# Patient Record
Sex: Male | Born: 1945 | Race: Black or African American | Hispanic: No | Marital: Married
Health system: Southern US, Community
[De-identification: ages and names within clinical notes are randomized; demographics above are authoritative.]

## PROBLEM LIST (undated history)

## (undated) DIAGNOSIS — I10 Essential (primary) hypertension: Secondary | ICD-10-CM

## (undated) DIAGNOSIS — N189 Chronic kidney disease, unspecified: Secondary | ICD-10-CM

## (undated) DIAGNOSIS — K859 Acute pancreatitis without necrosis or infection, unspecified: Secondary | ICD-10-CM

## (undated) DIAGNOSIS — IMO0002 Reserved for concepts with insufficient information to code with codable children: Secondary | ICD-10-CM

## (undated) DIAGNOSIS — R001 Bradycardia, unspecified: Secondary | ICD-10-CM

## (undated) HISTORY — DX: Chronic kidney disease, unspecified: N18.9

## (undated) HISTORY — DX: Essential (primary) hypertension: I10

## (undated) HISTORY — DX: Acute pancreatitis without necrosis or infection, unspecified: K85.90

## (undated) HISTORY — DX: Reserved for concepts with insufficient information to code with codable children: IMO0002

---

## 1998-09-19 ENCOUNTER — Emergency Department (HOSPITAL_COMMUNITY): Admission: EM | Admit: 1998-09-19 | Discharge: 1998-09-19 | Payer: Self-pay | Admitting: Emergency Medicine

## 1998-09-19 ENCOUNTER — Encounter: Payer: Self-pay | Admitting: Emergency Medicine

## 1998-10-26 ENCOUNTER — Emergency Department (HOSPITAL_COMMUNITY): Admission: EM | Admit: 1998-10-26 | Discharge: 1998-10-26 | Payer: Self-pay | Admitting: Emergency Medicine

## 1999-05-14 ENCOUNTER — Encounter: Payer: Self-pay | Admitting: Emergency Medicine

## 1999-05-14 ENCOUNTER — Emergency Department (HOSPITAL_COMMUNITY): Admission: EM | Admit: 1999-05-14 | Discharge: 1999-05-14 | Payer: Self-pay | Admitting: Emergency Medicine

## 2003-07-05 ENCOUNTER — Emergency Department (HOSPITAL_COMMUNITY): Admission: EM | Admit: 2003-07-05 | Discharge: 2003-07-05 | Payer: Self-pay | Admitting: Emergency Medicine

## 2004-01-18 ENCOUNTER — Emergency Department (HOSPITAL_COMMUNITY): Admission: EM | Admit: 2004-01-18 | Discharge: 2004-01-18 | Payer: Self-pay | Admitting: Emergency Medicine

## 2004-01-20 ENCOUNTER — Emergency Department (HOSPITAL_COMMUNITY): Admission: EM | Admit: 2004-01-20 | Discharge: 2004-01-20 | Payer: Self-pay | Admitting: *Deleted

## 2004-09-19 ENCOUNTER — Emergency Department (HOSPITAL_COMMUNITY): Admission: EM | Admit: 2004-09-19 | Discharge: 2004-09-19 | Payer: Self-pay | Admitting: Emergency Medicine

## 2005-12-25 ENCOUNTER — Emergency Department (HOSPITAL_COMMUNITY): Admission: EM | Admit: 2005-12-25 | Discharge: 2005-12-25 | Payer: Self-pay | Admitting: Emergency Medicine

## 2006-06-18 ENCOUNTER — Emergency Department (HOSPITAL_COMMUNITY): Admission: EM | Admit: 2006-06-18 | Discharge: 2006-06-18 | Payer: Self-pay | Admitting: Emergency Medicine

## 2007-02-12 ENCOUNTER — Emergency Department (HOSPITAL_COMMUNITY): Admission: EM | Admit: 2007-02-12 | Discharge: 2007-02-12 | Payer: Self-pay | Admitting: Emergency Medicine

## 2007-06-04 ENCOUNTER — Emergency Department (HOSPITAL_COMMUNITY): Admission: EM | Admit: 2007-06-04 | Discharge: 2007-06-04 | Payer: Self-pay | Admitting: Emergency Medicine

## 2008-05-21 ENCOUNTER — Emergency Department (HOSPITAL_COMMUNITY): Admission: EM | Admit: 2008-05-21 | Discharge: 2008-05-21 | Payer: Self-pay | Admitting: Emergency Medicine

## 2008-11-18 ENCOUNTER — Emergency Department (HOSPITAL_COMMUNITY): Admission: EM | Admit: 2008-11-18 | Discharge: 2008-11-18 | Payer: Self-pay | Admitting: Family Medicine

## 2008-11-18 ENCOUNTER — Emergency Department (HOSPITAL_COMMUNITY): Admission: EM | Admit: 2008-11-18 | Discharge: 2008-11-18 | Payer: Self-pay | Admitting: Emergency Medicine

## 2010-04-26 ENCOUNTER — Inpatient Hospital Stay (HOSPITAL_COMMUNITY)
Admission: EM | Admit: 2010-04-26 | Discharge: 2010-05-07 | Payer: Self-pay | Source: Home / Self Care | Attending: Internal Medicine | Admitting: Internal Medicine

## 2010-05-06 ENCOUNTER — Encounter: Payer: Self-pay | Admitting: Internal Medicine

## 2010-05-06 DIAGNOSIS — E119 Type 2 diabetes mellitus without complications: Secondary | ICD-10-CM | POA: Insufficient documentation

## 2010-05-06 DIAGNOSIS — I10 Essential (primary) hypertension: Secondary | ICD-10-CM

## 2010-05-06 LAB — CONVERTED CEMR LAB
Albumin: 1.8 g/dL
Alkaline Phosphatase: 215 units/L
BUN: 27 mg/dL
CO2: 21 meq/L
Calcium: 8 mg/dL
Chloride: 110 meq/L
Cholesterol: 164 mg/dL
Creatinine, Ser: 2.33 mg/dL
Platelets: 178 10*3/uL
Potassium: 4 meq/L
TSH: 1.787 microintl units/mL
Total Bilirubin: 2 mg/dL
WBC: 13.8 10*3/uL

## 2010-05-07 ENCOUNTER — Encounter: Payer: Self-pay | Admitting: Internal Medicine

## 2010-05-11 ENCOUNTER — Ambulatory Visit: Payer: Self-pay | Admitting: Internal Medicine

## 2010-05-11 ENCOUNTER — Encounter: Payer: Self-pay | Admitting: Internal Medicine

## 2010-05-11 LAB — CONVERTED CEMR LAB
Alcohol, Ethyl (B): <10 mg/dL (ref 0–10)
Amphetamine Screen, Ur: NEGATIVE
Barbiturate Quant, Ur: NEGATIVE
Benzodiazepines.: NEGATIVE
Bilirubin Urine: NEGATIVE
Blood Glucose, Fingerstick: 326
Creatinine,U: 52.5 mg/dL
Marijuana Metabolite: NEGATIVE
Methadone: NEGATIVE
Nitrite: NEGATIVE
Opiates: NEGATIVE
Phencyclidine (PCP): NEGATIVE
Propoxyphene: NEGATIVE
Specific Gravity, Urine: 1.01
pH: 5

## 2010-05-11 LAB — HM DIABETES FOOT EXAM: HM Diabetic Foot Exam: HIGH

## 2010-05-16 ENCOUNTER — Telehealth: Payer: Self-pay | Admitting: Internal Medicine

## 2010-05-25 ENCOUNTER — Ambulatory Visit: Admission: RE | Admit: 2010-05-25 | Discharge: 2010-05-25 | Payer: Self-pay | Source: Home / Self Care

## 2010-05-25 LAB — CONVERTED CEMR LAB
Blood Glucose, Home Monitor: 2 mg/dL
CO2: 25 meq/L (ref 19–32)
Calcium: 8.5 mg/dL (ref 8.4–10.5)
Sodium: 137 meq/L (ref 135–145)

## 2010-06-01 ENCOUNTER — Telehealth: Payer: Self-pay | Admitting: Internal Medicine

## 2010-06-09 ENCOUNTER — Ambulatory Visit: Admission: RE | Admit: 2010-06-09 | Discharge: 2010-06-09 | Payer: Self-pay | Source: Home / Self Care

## 2010-06-09 LAB — CONVERTED CEMR LAB
CO2: 25 meq/L (ref 19–32)
Creatinine, Ser: 0.92 mg/dL (ref 0.40–1.50)
Glucose, Bld: 63 mg/dL — ABNORMAL LOW (ref 70–99)
Sodium: 139 meq/L (ref 135–145)

## 2010-06-15 NOTE — Letter (Signed)
Summary: LOG BOOK REPORT  LOG BOOK REPORT   Imported By: Margie Billet 05/16/2010 10:15:01  _____________________________________________________________________  External Attachment:    Type:   Image     Comment:   External Document

## 2010-06-15 NOTE — Assessment & Plan Note (Signed)
Summary: DM TEACHING/CH   Vital Signs:  Patient profile:   65 year old male Weight:      192.2 pounds BMI:     26.53  Allergies: No Known Drug Allergies   Complete Medication List: 1)  Ciprofloxacin Hcl 500 Mg Tabs (Ciprofloxacin hcl) .... Take 1 tablet by mouth two times a day for 5 days 2)  Humulin 70/30 70-30 % Susp (Insulin isophane & regular) .... Inject 23 units in the morning and 17 units in the evening 3)  Bd Insulin Syringe 25g X 5/8" 1 Ml Misc (Insulin syringe-needle u-100) .... Use to inject insulin twice a day 4)  Ensure Liqd (Nutritional supplements) .Marland Kitchen.. 1 bottle three times a day 5)  Ranitidine Hcl 300 Mg Caps (Ranitidine hcl) .... Take 1 tablet by mouth once a day 6)  Onetouch Test Strp (Glucose blood) .... Use to check blood sugar three times a day 7)  Blood Glucose Meter Kit (Blood glucose monitoring suppl) .... Use to check blood sugar three times a day 8)  Amlodipine Besylate 10 Mg Tabs (Amlodipine besylate) .... Take 1 tablet by mouth once a day 9)  Aspirin 81 Mg Chew (Aspirin) .... Take 1 tablet by mouth once a day 10)  Fish Oil 500 Mg Caps (Omega-3 fatty acids) .... Take 1 tablet by mouth once a day 11)  Pravastatin Sodium 20 Mg Tabs (Pravastatin sodium) .... Take 1 tablet by mouth once a day 12)  Accu-chek Aviva Kit (Blood glucose monitoring suppl) .... Check blood glucose two times a day before breakfast and before bedtime 13)  Accu-chek Aviva Strp (Glucose blood) .... Check blood glucose two times a day as instructed  Other Orders: DSMT(Medicare) Individual, 30 Minutes (G0108)   Orders Added: 1)  DSMT(Medicare) Individual, 30 Minutes [G0108]    Diabetes Self Management Training  PCP: Bethel Born MD Date diagnosed with diabetes: 04/26/2010 Diabetes Type: Type 2 insulin Other persons present: Spouse Current smoking Status: never  Vital Signs Todays Weight: 192.2lb  in BMI 26.53in-lbs   Diabetes Medications:  Comments: Patient & wife seems  to be handling care of his diabetes well. asking appropriate questions. blood sugars since injecting 23 units before breakfast and 17 units before dinner:  fasting:230,268,179,99, 154,169, 195, 145 this am before lunch:323,489,287,164-had symptoms of hypoglycemia per wife- shaking, cold before dinner:220,109,274,200,241,274,161    Monitoring Self monitoring blood glucose 2 times a day Name of Meter  True Track     Estimated /Usual Carb Intake Breakfast # of Carbs/Grams raisin bran or rice kripsies Lunch # of Carbs/Grams sandwich or hot dog, sugar free drinks Midafternoon # of Carbs/Grams doughnut Dinner # of Carbs/Grams baked meat, starch, bread, vegetable Bedtime # of Carbs/Grams fruit cup  Nutrition assessment Desired Weight:  ~ 190 ETOH : No Biggest challenge to eating healthy: Imbalance CHO Diabetes Disease Process  Discussed today Define diabetes in simple terms: Needs review/assistance   State own type of diabetes: Demonstrates competencyState diabetes is treated by meal plan-exercise-medication-monitoring-education: Needs review/assistance Medications State name-action-dose-duration-side effects-and time to take medication: No knowledge   State appropriate timing of food related to medication: Demonstrates competency   Describe safe needle/lancet disposal: Demonstrates competency Nutritional Management Identify what foods most often affect blood glucose: Needs review/assistance    Verbalize importance of controlling food portions: Needs review/assistance   State changes planned for home meals/snacks: Needs review/assistance    Monitoring State purpose and frequency of monitoring BG-ketones-HgbA1C  : Needs review/assistance   Perform glucose monitoring/ketone testing and record results  correctly: Demonstrates competencyState target blood glucose and HgbA1C goals: Needs review/assistance    Complications State the causes- signs and symptoms and prevention of  hypoglycemia: Needs review/assistance   Explain proper treatment of hypoglycemia: Needs review/assistance Exercise  Lifestyle changes:Goal setting and Problem solving Identify lifestyle behaviors that need to change: Needs review/assistance   Identify risk factors that interfere with health: Needs review/assistance   Develop strategies to reduce risk factors: Needs review/assistance   List at least two appropriate community resources: No knowledgeIdentify Family/SO role in managing diabetes: Demonstrates competencyDiabetes Management Education Done: 05/25/2010    BEHAVIORAL GOALS INITIAL Incorporating appropriate nutritional management: try to eat consistent amount of carbs at meals and snacks Preventing,detecting and treating complications: always carry treatment for low blood sugar with you at all times        Diabetes Self Management Support: wife very supportive, CDE, office staff and visits Follow-up:2 weeks

## 2010-06-15 NOTE — Progress Notes (Signed)
Summary: / increase am insulin dose?/dmr  Phone Note Outgoing Call   Call placed by: Jamison Neighbor RD,CDE,  May 16, 2010 2:37 PM Summary of Call: call and spoke to wife: they have a meter [facility] [employer] brand of meter. advised her to shop for best price on strips until ornage card obtained or health insurance to help with cost. Blood sugars are :saturday, sunday, monday and today low in am: 325, 271, 183,165 high in pm-468 ,455, 308, haven't checked it yet  Using 18 units Humulin 70/30 before breakfast anbd 17 units before dinner- wife trying to get patient to drink sugar free liquids, but reports every now nad then he gets juice and drinks a 12-16 oz bottle. Can am insulin be increased? (calculated Total Daily Dose is  ~44 units a day, 25 units in am and 17 in Pm is  ~ 2/3 TDD in am and 1/3 TDD in Pm)     New/Updated Medications: HUMULIN 70/30 70-30 % SUSP (INSULIN ISOPHANE & REGULAR) inject 23 units in the morning and 17 units in the evening  Appended Document: / increase am insulin dose?/dmr    Phone Note Outgoing Call   Call placed by: Jamison Neighbor RD,CDE,  May 17, 2010 9:14 AM Summary of Call: Georgette Dover- Zeferino's wife with new morning insulin dose of 23 units int he moring before breakfast. Asked her to check his blood sugar 3x a day for 3 days starting tomorrow then go back to checking 2x a day and keep insulin at 23units before breakfast  and 17 units in PM. Appointment 1.12.12. with CDE- patient to =bring meter and call if any blood sugar < 70 mg/dl. Wife repeated instructions and new dose and understanding of when to call office.

## 2010-06-15 NOTE — Assessment & Plan Note (Signed)
Summary: CLASS/SB.   Vital Signs:  Patient profile:   65 year old male Height:      71.5 inches (181.61 cm) Weight:      195.3 pounds (88.77 kg) BMI:     26.96 Temp:     97.4 degrees F (36.33 degrees C) oral Pulse rate:   100 / minute BP sitting:   145 / 82  (right arm) Cuff size:   regular  Vitals Entered By: Chinita Pester RN (May 11, 2010 3:21 PM) Nutritional Status BMI of 25 - 29 = overweight  Does patient need assistance? Functional Status Self care Ambulation Normal    Diabetic Foot Exam  Set Next Diabetic Foot Exam here: 08/10/2010   Complete Medication List: 1)  Ciprofloxacin Hcl 500 Mg Tabs (Ciprofloxacin hcl) .... Take 1 tablet by mouth two times a day for 5 days 2)  Humulin 70/30 70-30 % Susp (Insulin isophane & regular) .... Inject 15 units in the morning and 15 units in the evening 3)  Bd Insulin Syringe 25g X 5/8" 1 Ml Misc (Insulin syringe-needle u-100) .... Use to inject insulin twice a day 4)  Ensure Liqd (Nutritional supplements) .Marland Kitchen.. 1 bottle three times a day 5)  Ranitidine Hcl 300 Mg Caps (Ranitidine hcl) .... Take 1 tablet by mouth once a day 6)  Onetouch Test Strp (Glucose blood) .... Use to check blood sugar three times a day 7)  Blood Glucose Meter Kit (Blood glucose monitoring suppl) .... Use to check blood sugar three times a day 8)  Amlodipine Besylate 10 Mg Tabs (Amlodipine besylate) .... Take 1 tablet by mouth once a day 9)  Aspirin 81 Mg Chew (Aspirin) .... Take 1 tablet by mouth once a day 10)  Fish Oil 500 Mg Caps (Omega-3 fatty acids) .... Take 1 tablet by mouth once a day 11)  Pravastatin Sodium 20 Mg Tabs (Pravastatin sodium) .... Take 1 tablet by mouth once a day 12)  Accu-chek Aviva Kit (Blood glucose monitoring suppl) .... Check blood glucose two times a day before breakfast and before bedtime 13)  Accu-chek Aviva Strp (Glucose blood) .... Check blood glucose two times a day as instructed  Other Orders: T- Capillary Blood Glucose  (16109) T-Urine Microalbumin w/creat. ratio [id number]) T-Basic Metabolic Panel [phone] T-HIV Antibody  (Reflex) [id number]) T- * Misc. Laboratory test [phone]) T-Urinalysis Dipstick only (81003QW) T- * Misc. Laboratory test [id number]) DME Referral (DME) Colonoscopy (Colon) Ophthalmology Referral (Ophthalmology) DSMT(Medicare) Individual, 30 Minutes (N0272)  Patient Instructions: 1)  Please, take Insulin as instructed. 2)  Please, DO NOT drink any alcohol! 3)  please, follow up with Ms. Deb Hill in regards to "an orange card." 4)  Please, call with any concerns; and make a follow up appointment in 1 week. Prescriptions: ACCU-CHEK AVIVA  STRP (GLUCOSE BLOOD) Check blood glucose two times a day as instructed  #100 x 11   Entered by:   Deatra Robinson MD   Authorized by:   Jamison Neighbor RD,CDE   Signed by:   Jamison Neighbor RD,CDE on 05/11/2010   Method used:   Print then Give to Patient   RxID:   [id number] ACCU-CHEK AVIVA  KIT (BLOOD GLUCOSE MONITORING SUPPL) check blood glucose two times a day before breakfast and before bedtime  #1 x 11   Entered by:   Deatra Robinson MD   Authorized by:   Jamison Neighbor RD,CDE   Signed by:   Jamison Neighbor RD,CDE on 05/11/2010   Method used:   Print then  Give to Patient   RxID:   [id number]    Orders Added: 1)  T- Capillary Blood Glucose [82948] 2)  T-Urine Microalbumin w/creat. ratio [[id number]] 3)  Est. Patient Level IV [41324] 4)  T-Basic Metabolic Panel [[id number]] 5)  T-HIV Antibody  (Reflex) [[id number]] 6)  T- * Misc. Laboratory test [99999] 7)  T-Urinalysis Dipstick only [81003QW] 8)  T- * Misc. Laboratory test [99999] 9)  DME Referral [DME] 10)  Colonoscopy [Colon] 11)  Ophthalmology Referral [Ophthalmology] 12)  DSMT(Medicare) Individual, 30 Minutes [G0108]    Prevention & Chronic Care Immunizations   Influenza vaccine: Not documented   Influenza vaccine due: 01/13/2011    Tetanus booster: Not  documented   Tetanus booster due: 05/11/2020    Pneumococcal vaccine: Not documented   Pneumococcal vaccine due: 02/14/2012    H. zoster vaccine: Not documented   H. zoster vaccine deferral: Not available  (05/11/2010)  Colorectal Screening   Hemoccult: Not documented   Hemoccult action/deferral: Refused  (05/11/2010)    Colonoscopy: Not documented   Colonoscopy action/deferral: GI referral  (05/11/2010)  Other Screening   PSA: Not documented   PSA action/deferral: Discussed-decision deferred  (05/11/2010)   PSA due due: 05/12/2011   Smoking status: Not documented  Diabetes Mellitus   HgbA1C: 12.0  (05/06/2010)   Hemoglobin A1C due: 08/10/2010    Eye exam: Not documented   Diabetic eye exam action/deferral: Ophthalmology referral  (05/11/2010)    Foot exam: Not documented   Foot exam action/deferral: Do today   High risk foot: Not documented   Foot care education: Not documented   Foot exam due: 08/10/2010    Urine microalbumin/creatinine ratio: Not documented   Urine microalbumin action/deferral: Ordered   Urine microalbumin/cr due: 05/12/2011    Diabetes flowsheet reviewed?: Yes   Progress toward A1C goal: Unchanged    Stage of readiness to change (diabetes management): Action  Lipids   Total Cholesterol: 164  (05/06/2010)   LDL: 110  (05/06/2010)   LDL Direct: Not documented   HDL: 12  (05/06/2010)   Triglycerides: 210  (05/06/2010)   Lipid panel due: 05/12/2011  Hypertension   Last Blood Pressure: 145 / 82  (05/11/2010)   Serum creatinine: 2.33  (05/06/2010)   Serum potassium 4  (05/06/2010)   Basic metabolic panel due: 06/11/2010    Hypertension flowsheet reviewed?: Yes   Progress toward BP goal: Unchanged    Stage of readiness to change (hypertension management): Action  Self-Management Support :    Diabetes self-management support: Written self-care plan, Referred for medical nutrition therapy  (05/11/2010)   Diabetes care plan printed    Last diabetes self-management training by diabetes educator: 05/11/2010    Hypertension self-management support: Written self-care plan, Referred for medical nutrition therapy  (05/11/2010)   Hypertension self-care plan printed.   Nursing Instructions: Give Flu vaccine today Give tetanus booster today Give Pneumovax today GI referral for screening colonoscopy (see order) CBG today (see order) Refer for screening diabetic eye exam (see order) Diabetic foot exam today Refer for medical nutrition therapy (see order)    Diabetes Self Management Training Referral Patient Name: Dennis [name] Date Of Birth: February 18, 1946 MRN: [id number] Current Diagnosis:  HEALTH SCREENING (ICD-V70.0) HEALTH EXAMINATION IN POPULATION SURVEY (ICD-V70.6) SPECIAL SCREENING FOR MALIGNANT NEOPLASMS COLON (ICD-V76.51) ALCOHOLISM (ICD-303.90) HYPERTENSION (ICD-401.9) LEUKOCYTOSIS (ICD-288.60) ACUTE KIDNEY FAILURE UNSPECIFIED (ICD-584.9) DIABETES MELLITUS, TYPE II (ICD-250.00) PANCREATITIS, HX OF (ICD-V12.70) PROSTATITIS, ACUTE (ICD-601.0)   Process Orders Check Orders Results:     [facility]:  ABN not required for this insurance Tests Sent for requisitioning (May 11, 2010 4:48 PM):     05/11/2010: [facility] -- T-Urine Microalbumin w/creat. ratio [[id number]] (signed)     05/11/2010: [facility] -- T-Basic Metabolic Panel [phone] (signed)     05/11/2010: [facility] -- T-HIV Antibody  (Reflex) [[id number]] (signed)     05/11/2010: [facility] -- T- * Misc. Laboratory test [phone] (signed)     05/11/2010: [facility] -- T- * Misc. Laboratory test [id number] (signed)    Process Orders Check Orders Results:     [facility]: ABN not required for this insurance Tests Sent for requisitioning (May 11, 2010 4:48 PM):     05/11/2010: [facility] -- T-Urine  Microalbumin w/creat. ratio [[id number]] (signed)     05/11/2010: [facility] -- T-Basic Metabolic Panel [phone] (signed)     05/11/2010: [facility] -- T-HIV Antibody  (Reflex) [[id number]] (signed)     05/11/2010: [facility] -- T- * Misc. Laboratory test [id number] (signed)     05/11/2010: [facility] -- T- * Misc. Laboratory test [phone] (signed)   The information above was entered in this note instead of the physician note.    Diabetes Self Management Training  PCP: Bethel Born MD Date diagnosed with diabetes: 04/26/2010 Diabetes Type: Type 2 insulin Other persons present: Spouse  Vital Signs Todays Weight: 195.3lb  Todays Height: 71.5in BMI 26.96in-lbs   Diabetes Medications:  Comments: all blood sugars are high, weight based calcualtion for total daily dose of  insulin is  ~ 44 units a day. patient currently only on 30 units a day and is eatig much better than when he wa sin hospital and still drinking 3 ensure a day that he was told to do when in hospital. I advised to only use ensure when he doesn;t feellike eating much or toreplace a meal or snack. He is eating fairly well now at home.     Next foot exam due: 08/10/2010  Estimated /Usual Carb Intake Breakfast # of Carbs/Grams eggs, bacon, cereal,  ensure, milk and orange juice Lunch # of Carbs/Grams TV dinner & diet soda Midafternoon # of Carbs/Grams canned peaches Dinner # of Carbs/Grams hamburger with cheese, ensure Bedtime # of Carbs/Grams milk or ensure  Nutrition assessment What beverages do you drink?  diet soda and whole milk- suggested he try 2% - he will not drink skim or 1% Diabetes Disease Process  Discussed today State own type of diabetes: No knowledge Medications State name-action-dose-duration-side effects-and time to take medication: No knowledge   State appropriate timing of food related to medication: No knowledge      Nutritional Management Identify what foods most often affect blood glucose: No knowledge    State importance of spacing and not omitting meals and snacks: No knowledge    Monitoring State purpose and frequency of monitoring BG-ketones-HgbA1C  : No knowledge   State target blood glucose and HgbA1C goals: No knowledge    Complications State the causes-signs and symptoms and prevention of Hyperglycemia: No knowledge   Explain proper treatment of hyperglycemia: No knowledge    Exercise  Lifestyle changes:Goal setting and Problem solving Verbalize need for and frequency of health care follow-up: Demonstrates competency   Diabetes Management Education Done: 05/11/2010        Diabetes Self Management Support: wife very supportive, CDE, office staff and visits Follow-up:2 weeks

## 2010-06-15 NOTE — Assessment & Plan Note (Signed)
Summary: RA/NEEDS 1 MONTH F/U VISIT/CH   Vital Signs:  Patient profile:   65 year old male Height:      71.5 inches Weight:      196.9 pounds BMI:     27.18 Temp:     97.9 degrees F oral Pulse rate:   75 / minute BP sitting:   124 / 67  (right arm)  Vitals Entered By: Filomena Jungling NT II (June 09, 2010 9:54 AM) CC: FOLLOWUP VISIT Is Patient Diabetic? Yes Did you bring your meter with you today? Yes Pain Assessment Patient in pain? no       Have you ever been in a relationship where you felt threatened, hurt or afraid?No   Does patient need assistance? Functional Status Self care Ambulation Normal   Primary Care Provider:  Bethel Born MD  CC:  FOLLOWUP VISIT.  History of Present Illness: 65 y/o m with DM, HTN,alcaholism comes for follow up  No new compaints today DM- his last HbA1 c was 12 on 12/11. he has been complaint with his medications and insulin. He checks his CBG at home and says that the sugars run in 100-150s. his does not have any vision changes or new ulcers in the foot/upper extremity. he has been followed by Jamison Neighbor regulalrly. He has been educated about carb counting, Insulin injections and other aspects of DM. No s/s of hypoglycemia   HTN- well controlled on current regimen. No episdes of dizziness, light headed ness or hypotension. complaint with current medication. no perceived side effects at this time.        Preventive Screening-Counseling & Management  Alcohol-Tobacco     Alcohol drinks/day: 0     Smoking Status: never  Caffeine-Diet-Exercise     Does Patient Exercise: no  Current Medications (verified): 1)  Humulin 70/30 70-30 % Susp (Insulin Isophane & Regular) .... Inject 23 Units in The Morning and 17 Units in The Evening 2)  Bd Insulin Syringe 25g X 5/8" 1 Ml Misc (Insulin Syringe-Needle U-100) .... Use To Inject Insulin Twice A Day 3)  Ranitidine Hcl 300 Mg Caps (Ranitidine Hcl) .... Take 1 Tablet By Mouth Once A Day For  Heartburn 4)  Onetouch Test  Strp (Glucose Blood) .... Use To Check Blood Sugar Three Times A Day 5)  Blood Glucose Meter  Kit (Blood Glucose Monitoring Suppl) .... Use To Check Blood Sugar Three Times A Day 6)  Amlodipine Besylate 10 Mg Tabs (Amlodipine Besylate) .... Take 1 Tablet By Mouth Once A Day For Blood Preassure 7)  Aspirin 81 Mg Chew (Aspirin) .... Take 1 Tablet By Mouth Once A Day For Heart 8)  Pravastatin Sodium 20 Mg Tabs (Pravastatin Sodium) .... Take 1 Tablet By Mouth Once A Day 9)  Accu-Chek Aviva  Kit (Blood Glucose Monitoring Suppl) .... Check Blood Glucose Two Times A Day Before Breakfast and Before Bedtime 10)  Accu-Chek Aviva  Strp (Glucose Blood) .... Check Blood Glucose Two Times A Day As Instructed 11)  Ibuprofen 200 Mg Tabs (Ibuprofen) .Marland Kitchen.. 1-2 Tablets As Needed For Pain  Allergies (verified): No Known Drug Allergies  Review of Systems  The patient denies anorexia, fever, weight loss, weight gain, vision loss, decreased hearing, hoarseness, chest pain, syncope, dyspnea on exertion, peripheral edema, prolonged cough, headaches, hemoptysis, abdominal pain, melena, hematochezia, severe indigestion/heartburn, hematuria, incontinence, genital sores, muscle weakness, suspicious skin lesions, transient blindness, difficulty walking, depression, unusual weight change, abnormal bleeding, enlarged lymph nodes, angioedema, breast masses, and testicular masses.  Physical Exam  General:  Gen: VS reveiwed, Alert, well developed, nodistress ENT: mucous membranes pink & moist. No abnormal finds in ear and nose. CVC:S1 S2 , no murmurs, no abnormal heart sounds. Lungs: Clear to auscultation B/L. No wheezes, crackles or other abnormal sounds Abdomen: soft, non distended, no tender. Normal Bowel sounds EXT: no pitting edema, no engorged veins, Pulsations normal  Neuro:alert, oriented *3, cranial nerved 2-12 intact, strenght normal in all  extremities, senstations normal to light  touch.      Impression & Recommendations:  Problem # 1:  HYPERTENSION (ICD-401.9)  well conotrlled with just norvasc will add lisinopril low dose for renal protection will check BMET today May change regimen to hctz-lisinopril and d/c norvasc to just keep one medicine renal function stabilizes. REcheck BMET at next visit   His updated medication list for this problem includes:    Amlodipine Besylate 10 Mg Tabs (Amlodipine besylate) .Marland Kitchen... Take 1 tablet by mouth once a day for blood preassure    Lisinopril 5 Mg Tabs (Lisinopril) .Marland Kitchen... Take 1 tablet by mouth once a day for kidneys  BP today: 124/67 Prior BP: 145/82 (05/11/2010)  Labs Reviewed: K+: 4.0 (05/11/2010) Creat: : 1.96 (05/11/2010)   Chol: 164 (05/06/2010)   HDL: 12 (05/06/2010)   LDL: 110 (05/06/2010)   TG: 210 (05/06/2010)  Orders: T-Basic Metabolic Panel [phone])  Problem # 2:  DIABETES MELLITUS, TYPE II (ICD-250.00) doing well based on meter readings on current dose continue for now  His updated medication list for this problem includes:    Humulin 70/30 70-30 % Susp (Insulin isophane & regular) ..... Inject 23 units in the morning and 17 units in the evening    Aspirin 81 Mg Chew (Aspirin) .Marland Kitchen... Take 1 tablet by mouth once a day for heart    Lisinopril 5 Mg Tabs (Lisinopril) .Marland Kitchen... Take 1 tablet by mouth once a day for kidneys  Complete Medication List: 1)  Humulin 70/30 70-30 % Susp (Insulin isophane & regular) .... Inject 23 units in the morning and 17 units in the evening 2)  Bd Insulin Syringe 25g X 5/8" 1 Ml Misc (Insulin syringe-needle u-100) .... Use to inject insulin twice a day 3)  Ranitidine Hcl 300 Mg Caps (Ranitidine hcl) .... Take 1 tablet by mouth once a day for heartburn 4)  Onetouch Test Strp (Glucose blood) .... Use to check blood sugar three times a day 5)  Blood Glucose Meter Kit (Blood glucose monitoring suppl) .... Use to check blood sugar three times a day 6)  Amlodipine Besylate 10 Mg  Tabs (Amlodipine besylate) .... Take 1 tablet by mouth once a day for blood preassure 7)  Aspirin 81 Mg Chew (Aspirin) .... Take 1 tablet by mouth once a day for heart 8)  Pravastatin Sodium 20 Mg Tabs (Pravastatin sodium) .... Take 1 tablet by mouth once a day 9)  Accu-chek Aviva Kit (Blood glucose monitoring suppl) .... Check blood glucose two times a day before breakfast and before bedtime 10)  Accu-chek Aviva Strp (Glucose blood) .... Check blood glucose two times a day as instructed 11)  Ibuprofen 200 Mg Tabs (Ibuprofen) .Marland Kitchen.. 1-2 tablets as needed for pain 12)  Lisinopril 5 Mg Tabs (Lisinopril) .... Take 1 tablet by mouth once a day for kidneys  Other Orders: T-Hemoccult Card-Multiple (take home) ([id number])  Patient Instructions: 1)  Please schedule a follow-up appointment in 2-3 months with me. Prescriptions: LISINOPRIL 5 MG TABS (LISINOPRIL) Take 1 tablet by mouth once a day  for kidneys  #31 x 0   Entered and Authorized by:   Bethel Born MD   Signed by:   Bethel Born MD on 06/09/2010   Method used:   Electronically to        [facility] [phone]* (retail)       [address]       [place], [place]  [address]       Ph: [phone]       Fax: [phone]   RxID:   [id number]    Orders Added: 1)  T-Basic Metabolic Panel [id number] 2)  Est. Patient Level IV [30160] 3)  T-Hemoccult Card-Multiple (take home) [82270]   Process Orders Check Orders Results:     [facility]: ABN not required for this insurance Tests Sent for requisitioning (June 09, 2010 1:21 PM):     06/09/2010: [facility] -- T-Basic Metabolic Panel [phone] (signed)     Prevention & Chronic Care Immunizations   Influenza vaccine: Not documented   Influenza vaccine due: 01/13/2012    Tetanus booster: Not documented   Tetanus booster due: 05/11/2020    Pneumococcal vaccine: Not documented   Pneumococcal vaccine due: 02/14/2012    H. zoster  vaccine: Not documented   H. zoster vaccine deferral: Not available  (05/11/2010)  Colorectal Screening   Hemoccult: Not documented   Hemoccult action/deferral: Refused  (05/11/2010)    Colonoscopy: Not documented   Colonoscopy action/deferral: GI referral  (05/11/2010)  Other Screening   PSA: Not documented   PSA action/deferral: Discussed-decision deferred  (05/11/2010)   PSA due due: 05/12/2011   Smoking status: never  (06/09/2010)  Diabetes Mellitus   HgbA1C: 12.0  (05/06/2010)   Hemoglobin A1C due: 08/10/2010    Eye exam: Not documented   Diabetic eye exam action/deferral: Ophthalmology referral  (05/11/2010)    Foot exam: yes  (05/11/2010)   Foot exam action/deferral: Do today   High risk foot: Yes  (05/11/2010)   Foot care education: Done  (05/11/2010)   Foot exam due: 08/10/2010    Urine microalbumin/creatinine ratio: 130.2  (05/11/2010)   Urine microalbumin action/deferral: Ordered   Urine microalbumin/cr due: 05/12/2011    Diabetes flowsheet reviewed?: Yes   Progress toward A1C goal: Unchanged  Lipids   Total Cholesterol: 164  (05/06/2010)   LDL: 110  (05/06/2010)   LDL Direct: Not documented   HDL: 12  (05/06/2010)   Triglycerides: 210  (05/06/2010)   Lipid panel due: 05/12/2011  Hypertension   Last Blood Pressure: 124 / 67  (06/09/2010)   Serum creatinine: 1.96  (05/11/2010)   Serum potassium 4.0  (05/11/2010)   Basic metabolic panel due: 06/11/2010    Hypertension flowsheet reviewed?: Yes   Progress toward BP goal: At goal  Self-Management Support :   Personal Goals (by the next clinic visit) :     Personal A1C goal: 7  (05/11/2010)     Personal blood pressure goal: 130/80  (05/11/2010)     Personal LDL goal: 100  (05/11/2010)    Patient will work on the following items until the next clinic visit to reach self-care goals:     Medications and monitoring: take my medicines every day, check my blood sugar, examine my feet every day   (06/09/2010)     Eating: eat more vegetables, use fresh or frozen vegetables, eat foods that are low in salt, eat baked foods instead of fried foods, eat fruit for snacks and desserts  (06/09/2010)  Activity: take a 30 minute walk every day  (06/09/2010)    Diabetes self-management support: Written self-care plan, Referred for medical nutrition therapy  (05/11/2010)   Last diabetes self-management training by diabetes educator: 05/25/2010    Hypertension self-management support: Written self-care plan, Referred for medical nutrition therapy  (05/11/2010)

## 2010-06-15 NOTE — Discharge Summary (Signed)
Summary: Hospital Discharge Update    Hospital Discharge Update:  Date of Admission: 04/26/2010 Date of Discharge: 05/07/2010  Brief Summary:  prostatis new diagnoses- DM, HTN, HLD   Lab or other results pending at discharge:  none  Labs needed at follow-up: CBC with differential, Comprehensive metabolic panel  Other follow-up issues:  - resolution of urinanry complaints and abdominal pain from prostatis - CMET- to llok for LFTs as he had a temporary rise in LFTs with obstructive jaundice picture - Also note creatitnine as he had an Acute renal failure from gentamicin toxicity in the hospital that was resoving at discharge - Diabetes- inquire if he is able to get insulin 70/30. Restart metformin if creatinine normalized. Schedule an appointment with Jamison Neighbor. See if he is checking blood sugars at home. Give prescription for glucose meter and test strips after he sees Jaynee Eagles for orange card - HTN- start ACei/HCTZ if  creatinine better - Jaynee Eagles consultation for orange card and financial aid  The medication, problem, and allergy lists have been updated.  Please see the dictated discharge summary for details.  Discharge medications:  CIPROFLOXACIN HCL 500 MG TABS (CIPROFLOXACIN HCL) Take 1 tablet by mouth two times a day for 5 days HUMULIN 70/30 70-30 % SUSP (INSULIN ISOPHANE & REGULAR) inject 15 units in the morning and 15 units in the evening BD INSULIN SYRINGE 25G X 5/8" 1 ML MISC (INSULIN SYRINGE-NEEDLE U-100) use to inject insulin twice a day ENSURE  LIQD (NUTRITIONAL SUPPLEMENTS) 1 bottle three times a day RANITIDINE HCL 300 MG CAPS (RANITIDINE HCL) Take 1 tablet by mouth once a day ONETOUCH TEST  STRP (GLUCOSE BLOOD) use to check blood sugar three times a day BLOOD GLUCOSE METER  KIT (BLOOD GLUCOSE MONITORING SUPPL) use to check blood sugar three times a day AMLODIPINE BESYLATE 10 MG TABS (AMLODIPINE BESYLATE) Take 1 tablet by mouth once a day ASPIRIN 81 MG CHEW  (ASPIRIN) Take 1 tablet by mouth once a day FISH OIL 500 MG CAPS (OMEGA-3 FATTY ACIDS) Take 1 tablet by mouth once a day PRAVASTATIN SODIUM 20 MG TABS (PRAVASTATIN SODIUM) Take 1 tablet by mouth once a day  Other patient instructions:  your next appointment [facility] is on 05/11/10 at 3 pm  Note: Hospital Discharge Medications & Other Instructions handout was printed, one copy for patient and a second copy to be placed in hospital chart.

## 2010-06-15 NOTE — Miscellaneous (Signed)
Summary: hospital discharge (prostatitis)  Hospital Discharge  Date of admission:04/26/10  Date of discharge:05/07/10  Brief reason for admission/active problems:abdominal pain- found to have severe prostatitis/?abcess, urologist recommended medical management, also found to have DM and HTN (new diagnosis)  Followup needed: - resolution of urinanry complaints and abdominal pain from prostatis - CMET- to llok for LFTs as he had a temporary rise in LFTs with obstructive jaundice picture - Also note creatitnine as he had an Acute renal failure from gentamicin toxicity in the hospital that was resoving at discharge - Diabetes- inquire if he is able to get insulin 70/30. Restart metformin if creatinine normalized. Schedule an appointment with Jamison Neighbor. See if he is checking blood sugars at home. Give prescription for glucose meter and test strips after he sees Jaynee Eagles for orange card - HTN- start ACei/HCTZ if  creatinine better - Jaynee Eagles consultation for orange card and financial aid  The medication and problem lists have been updated.  Please see the dictated discharge summary for details.    Prescriptions: PRAVASTATIN SODIUM 20 MG TABS (PRAVASTATIN SODIUM) Take 1 tablet by mouth once a day  #30 x 0   Entered and Authorized by:   Bethel Born MD   Signed by:   Bethel Born MD on 05/07/2010   Method used:   Print then Give to Patient   RxID:   [id number] ASPIRIN 81 MG CHEW (ASPIRIN) Take 1 tablet by mouth once a day  #31 x 0   Entered and Authorized by:   Bethel Born MD   Signed by:   Bethel Born MD on 05/07/2010   Method used:   Print then Give to Patient   RxID:   [id number] AMLODIPINE BESYLATE 10 MG TABS (AMLODIPINE BESYLATE) Take 1 tablet by mouth once a day  #31 x 0   Entered and Authorized by:   Bethel Born MD   Signed by:   Bethel Born MD on 05/07/2010   Method used:   Print then Give to Patient   RxID:   [id number] RANITIDINE HCL 300 MG  CAPS (RANITIDINE HCL) Take 1 tablet by mouth once a day  #30 x 0   Entered and Authorized by:   Bethel Born MD   Signed by:   Bethel Born MD on 05/07/2010   Method used:   Print then Give to Patient   RxID:   [id number] ENSURE  LIQD (NUTRITIONAL SUPPLEMENTS) 1 bottle three times a day  #100 x prn   Entered and Authorized by:   Bethel Born MD   Signed by:   Bethel Born MD on 05/07/2010   Method used:   Print then Give to Patient   RxID:   [id number] BD INSULIN SYRINGE 25G X 5/8" 1 ML MISC (INSULIN SYRINGE-NEEDLE U-100) use to inject insulin twice a day  #1 month supp x 0   Entered and Authorized by:   Bethel Born MD   Signed by:   Bethel Born MD on 05/07/2010   Method used:   Print then Give to Patient   RxID:   [id number] HUMULIN 70/30 70-30 % SUSP (INSULIN ISOPHANE & REGULAR) inject 15 units in the morning and 15 units in the evening  #1 month supp x 0   Entered and Authorized by:   Bethel Born MD   Signed by:   Bethel Born MD on 05/07/2010   Method used:   Print then Give to Patient   RxID:   [id number] CIPROFLOXACIN HCL 500 MG TABS (  CIPROFLOXACIN HCL) Take 1 tablet by mouth two times a day for 5 days  #10 x 0   Entered and Authorized by:   Bethel Born MD   Signed by:   Bethel Born MD on 05/07/2010   Method used:   Print then Give to Patient   RxID:   [id number] FISH OIL 500 MG CAPS (OMEGA-3 FATTY ACIDS) Take 1 tablet by mouth once a day  #OTC x 0   Entered and Authorized by:   Bethel Born MD   Signed by:   Bethel Born MD on 05/07/2010   Method used:   Print then Give to Patient   RxID:   [id number] ASPIRIN 81 MG CHEW (ASPIRIN) Take 1 tablet by mouth once a day  #31 x 0   Entered and Authorized by:   Bethel Born MD   Signed by:   Bethel Born MD on 05/07/2010   Method used:   Print then Give to Patient   RxID:   [id number] AMLODIPINE BESYLATE 10 MG TABS (AMLODIPINE BESYLATE) Take 1 tablet by mouth once a day   #31 x 0   Entered and Authorized by:   Bethel Born MD   Signed by:   Bethel Born MD on 05/07/2010   Method used:   Print then Give to Patient   RxID:   [id number] RANITIDINE HCL 300 MG CAPS (RANITIDINE HCL) Take 1 tablet by mouth once a day  #30 x 0   Entered and Authorized by:   Bethel Born MD   Signed by:   Bethel Born MD on 05/07/2010   Method used:   Print then Give to Patient   RxID:   [id number] ENSURE  LIQD (NUTRITIONAL SUPPLEMENTS) 1 bottle three times a day  #100 x prn   Entered and Authorized by:   Bethel Born MD   Signed by:   Bethel Born MD on 05/07/2010   Method used:   Print then Give to Patient   RxID:   [id number] BD INSULIN SYRINGE 25G X 5/8" 1 ML MISC (INSULIN SYRINGE-NEEDLE U-100) use to inject insulin twice a day  #1 month supp x 0   Entered and Authorized by:   Bethel Born MD   Signed by:   Bethel Born MD on 05/07/2010   Method used:   Print then Give to Patient   RxID:   [id number] HUMULIN 70/30 70-30 % SUSP (INSULIN ISOPHANE & REGULAR) inject 15 units in the morning and 15 units in the evening  #1 month supp x 0   Entered and Authorized by:   Bethel Born MD   Signed by:   Bethel Born MD on 05/07/2010   Method used:   Print then Give to Patient   RxID:   [id number] CIPROFLOXACIN HCL 500 MG TABS (CIPROFLOXACIN HCL) Take 1 tablet by mouth two times a day for 5 days  #10 x 0   Entered and Authorized by:   Bethel Born MD   Signed by:   Bethel Born MD on 05/07/2010   Method used:   Print then Give to Patient   RxID:   [id number] ASPIRIN 81 MG CHEW (ASPIRIN) Take 1 tablet by mouth once a day  #31 x 0   Entered and Authorized by:   Bethel Born MD   Signed by:   Bethel Born MD on 05/06/2010   Method used:   Print then Give to Patient   RxID:   [id number] AMLODIPINE BESYLATE 10 MG TABS (AMLODIPINE BESYLATE) Take 1  tablet by mouth once a day  #31 x 0   Entered and Authorized by:   Bethel Born MD    Signed by:   Bethel Born MD on 05/06/2010   Method used:   Print then Give to Patient   RxID:   [id number] HUMULIN 70/30 70-30 % SUSP (INSULIN ISOPHANE & REGULAR) inject 15 units in the morning and 15 units in the evening  #1 month supp x 0   Entered and Authorized by:   Bethel Born MD   Signed by:   Bethel Born MD on 05/06/2010   Method used:   Print then Give to Patient   RxID:   [id number] BLOOD GLUCOSE METER  KIT (BLOOD GLUCOSE MONITORING SUPPL) use to check blood sugar three times a day  #1 x 0   Entered and Authorized by:   Bethel Born MD   Signed by:   Bethel Born MD on 05/06/2010   Method used:   Print then Give to Patient   RxID:   [id number] ONETOUCH TEST  STRP (GLUCOSE BLOOD) use to check blood sugar three times a day  #1 month supp x 0   Entered and Authorized by:   Bethel Born MD   Signed by:   Bethel Born MD on 05/06/2010   Method used:   Print then Give to Patient   RxID:   [id number] RANITIDINE HCL 300 MG CAPS (RANITIDINE HCL) Take 1 tablet by mouth once a day  #30 x 0   Entered and Authorized by:   Bethel Born MD   Signed by:   Bethel Born MD on 05/06/2010   Method used:   Print then Give to Patient   RxID:   [id number] ENSURE  LIQD (NUTRITIONAL SUPPLEMENTS) 1 bottle three times a day  #100 x prn   Entered and Authorized by:   Bethel Born MD   Signed by:   Bethel Born MD on 05/06/2010   Method used:   Print then Give to Patient   RxID:   [id number] BD INSULIN SYRINGE 25G X 5/8" 1 ML MISC (INSULIN SYRINGE-NEEDLE U-100) use to inject insulin twice a day  #1 month supp x 0   Entered and Authorized by:   Bethel Born MD   Signed by:   Bethel Born MD on 05/06/2010   Method used:   Print then Give to Patient   RxID:   [id number] CIPROFLOXACIN HCL 500 MG TABS (CIPROFLOXACIN HCL) Take 1 tablet by mouth two times a day for 5 days  #10 x 0   Entered and Authorized by:   Bethel Born MD   Signed by:   Bethel Born MD on 05/06/2010   Method used:   Print then Give to Patient   RxID:   [id number]      Family History: Diabetes in sister  Social History: USed to be a heavy drinker in past, drinks occasional beer now Non smoker h/o marijuana use Worked at [employer], retired in [employer] school graduate No primary insurance    Patient Instructions: 1)  Your appointment in the [facility] is on 05/11/10 at 3 pm. Bring all your medicines at that time. Our number is [phone] if you have any questions.

## 2010-06-15 NOTE — Assessment & Plan Note (Signed)
Summary: HFU/ NEW TO CLINIC/SB   Vital Signs:  Patient profile:   65 year old male Height:      71.5 inches (181.61 cm) Weight:      195.3 pounds (88.77 kg) BMI:     26.96 Temp:     97.4 degrees F oral Pulse rate:   100 / minute BP sitting:   145 / 82  (right arm) Cuff size:   regular  Vitals Entered By: Chinita Pester RN (May 11, 2010 3:24 PM) CC: Hospital f/u. New diabetic. Is Patient Diabetic? Yes Did you bring your meter with you today? Yes Pain Assessment Patient in pain? no      Nutritional Status BMI of 25 - 29 = overweight CBG Result 326  Have you ever been in a relationship where you felt threatened, hurt or afraid?Unable to ask; wife w/pt.   Does patient need assistance? Functional Status Self care Ambulation Normal   Diabetic Foot Exam Foot Inspection Is there a history of a foot ulcer?              No Is there a foot ulcer now?              No Can the patient see the bottom of their feet?          Yes Are the shoes appropriate in style and fit?          Yes Is there swelling or an abnormal foot shape?          No Are the toenails long?                No Are the toenails thick?                Yes Are the toenails ingrown?              No Is there heavy callous build-up?              Yes Is there pain in the calf muscle (Intermittent claudication) when walking?    NoIs there a claw toe deformity?              No Is there elevated skin temperature?            No Is there limited ankle dorsiflexion?            No Is there foot or ankle muscle weakness?            No  Diabetic Foot Care Education Patient educated on appropriate care of diabetic feet.  Pulse Check          Right Foot          Left Foot Posterior Tibial:        normal            normal Dorsalis Pedis:        normal            normal  High Risk Feet? Yes   10-g (5.07) Semmes-Weinstein Monofilament Test           Right Foot          Left Foot Visual Inspection               Test  Control      normal         normal Site 1         normal         normal Site 2  normal         normal Site 3         normal         normal Site 4         normal         normal Site 5         normal         normal Site 6         normal         normal Site 7         normal         normal Site 8         normal         normal Site 9         normal         normal Site 10         normal         normal  Impression      normal         normal  Legend:  Site 1 = Plantar aspect of first toe (center of pad) Site 2 = Plantar aspect of third toe (center of pad) Site 3 = Plantar aspect of fifth toe (center of pad) Site 4 = Plantar aspect of first metatarsal head Site 5 = Plantar aspect of third metatarsal head Site 6 = Plantar aspect of fifth metatarsal head Site 7 = Plantar aspect of medial midfoot Site 8 = Plantar aspect of lateral midfoot Site 9 = Plantar aspect of heel Site 10 = dorsal aspect of foot between the base of the first and second toes   Result is Abnormal if patient was unable to perceive the monofilament at site indicated.    CC:  Hospital f/u. New diabetic.Dennis [name]  History of Present Illness: Follow up from hospitalization ->please, refer to Dr. Macario Carls d/C summary note. patient reports feeling better. Denies any Sx besides poor appetite and feeling weak.  Depression History:      The patient denies a depressed mood most of the day and a diminished interest in his usual daily activities.         Preventive Screening-Counseling & Management  Alcohol-Tobacco     Alcohol drinks/day: 0     Smoking Status: never  Caffeine-Diet-Exercise     Does Patient Exercise: no  Problems Prior to Update: 1)  Alcoholism  (ICD-303.90) 2)  Hypertension  (ICD-401.9) 3)  Leukocytosis  (ICD-288.60) 4)  Acute Kidney Failure Unspecified  (ICD-584.9) 5)  Diabetes Mellitus, Type II  (ICD-250.00) 6)  Pancreatitis, Hx of  (ICD-V12.70) 7)  Prostatitis, Acute  (ICD-601.0)  Current  Problems (verified): 1)  Health Screening  (ICD-V70.0) 2)  Alcoholism  (ICD-303.90) 3)  Hypertension  (ICD-401.9) 4)  Leukocytosis  (ICD-288.60) 5)  Acute Kidney Failure Unspecified  (ICD-584.9) 6)  Diabetes Mellitus, Type II  (ICD-250.00) 7)  Pancreatitis, Hx of  (ICD-V12.70) 8)  Prostatitis, Acute  (ICD-601.0)  Medications Prior to Update: 1)  Ciprofloxacin Hcl 500 Mg Tabs (Ciprofloxacin Hcl) .... Take 1 Tablet By Mouth Two Times A Day For 5 Days 2)  Humulin 70/30 70-30 % Susp (Insulin Isophane & Regular) .... Inject 15 Units in The Morning and 15 Units in The Evening 3)  Bd Insulin Syringe 25g X 5/8" 1 Ml Misc (Insulin Syringe-Needle U-100) .... Use To Inject Insulin Twice A Day 4)  Ensure  Liqd (Nutritional Supplements) .Dennis [name].. 1 Bottle Three Times A Day  5)  Ranitidine Hcl 300 Mg Caps (Ranitidine Hcl) .... Take 1 Tablet By Mouth Once A Day 6)  Onetouch Test  Strp (Glucose Blood) .... Use To Check Blood Sugar Three Times A Day 7)  Blood Glucose Meter  Kit (Blood Glucose Monitoring Suppl) .... Use To Check Blood Sugar Three Times A Day 8)  Amlodipine Besylate 10 Mg Tabs (Amlodipine Besylate) .... Take 1 Tablet By Mouth Once A Day 9)  Aspirin 81 Mg Chew (Aspirin) .... Take 1 Tablet By Mouth Once A Day 10)  Fish Oil 500 Mg Caps (Omega-3 Fatty Acids) .... Take 1 Tablet By Mouth Once A Day 11)  Pravastatin Sodium 20 Mg Tabs (Pravastatin Sodium) .... Take 1 Tablet By Mouth Once A Day  Current Medications (verified): 1)  Ciprofloxacin Hcl 500 Mg Tabs (Ciprofloxacin Hcl) .... Take 1 Tablet By Mouth Two Times A Day For 5 Days 2)  Humulin 70/30 70-30 % Susp (Insulin Isophane & Regular) .... Inject 15 Units in The Morning and 15 Units in The Evening 3)  Bd Insulin Syringe 25g X 5/8" 1 Ml Misc (Insulin Syringe-Needle U-100) .... Use To Inject Insulin Twice A Day 4)  Ensure  Liqd (Nutritional Supplements) .Dennis [name].. 1 Bottle Three Times A Day 5)  Ranitidine Hcl 300 Mg Caps (Ranitidine Hcl) .... Take 1  Tablet By Mouth Once A Day 6)  Onetouch Test  Strp (Glucose Blood) .... Use To Check Blood Sugar Three Times A Day 7)  Blood Glucose Meter  Kit (Blood Glucose Monitoring Suppl) .... Use To Check Blood Sugar Three Times A Day 8)  Amlodipine Besylate 10 Mg Tabs (Amlodipine Besylate) .... Take 1 Tablet By Mouth Once A Day 9)  Aspirin 81 Mg Chew (Aspirin) .... Take 1 Tablet By Mouth Once A Day 10)  Fish Oil 500 Mg Caps (Omega-3 Fatty Acids) .... Take 1 Tablet By Mouth Once A Day 11)  Pravastatin Sodium 20 Mg Tabs (Pravastatin Sodium) .... Take 1 Tablet By Mouth Once A Day  Allergies (verified): No Known Drug Allergies  Past History:  Past medical, surgical, family and social histories (including risk factors) reviewed for relevance to current acute and chronic problems.  Family History: Reviewed history from 05/06/2010 and no changes required. Diabetes in sister  Social History: Reviewed history from 05/06/2010 and no changes required. USed to be a heavy drinker in past, drinks occasional beer now Non smoker h/o marijuana use Worked at [employer], retired in [employer] school graduate No primary insurance Smoking Status:  never Does Patient Exercise:  no  Physical Exam  General:  alert, well-developed, well-hydrated, and underweight appearing.  Strong alcohol malodor present Head:  normocephalic, atraumatic, and no abnormalities observed.   Eyes:  No corneal or conjunctival inflammation noted. EOMI. Perrla. Funduscopic exam benign, without hemorrhages, exudates or papilledema. Vision grossly normal. Ears:  External ear exam shows no significant lesions or deformities.  Otoscopic examination reveals clear canals, tympanic membranes are intact bilaterally without bulging, retraction, inflammation or discharge. Hearing is grossly normal bilaterally. Nose:  External nasal examination shows no deformity or inflammation. Nasal mucosa are pink and moist without lesions or  exudates. Mouth:  pharynx pink and moist, poor dentition, and teeth missing.   Neck:  No deformities, masses, or tenderness noted. Lungs:  Normal respiratory effort, chest expands symmetrically. Lungs are clear to auscultation, no crackles or wheezes. Heart:  Normal rate and regular rhythm. S1 and S2 normal without gallop, murmur, click, rub or other extra sounds. Abdomen:  Bowel  sounds positive,abdomen soft and non-tender without masses, organomegaly or hernias noted. Msk:  No deformity or scoliosis noted of thoracic or lumbar spine.   Pulses:  R and L carotid,radial,femoral,dorsalis pedis and posterior tibial pulses are full and equal bilaterally Extremities:  No clubbing, cyanosis, edema, or deformity noted with normal full range of motion of all joints.   Neurologic:  No cranial nerve deficits noted. Station and gait are normal. Plantar reflexes are down-going bilaterally. DTRs are symmetrical throughout. Sensory, motor and coordinative functions appear intact. Skin:  Intact without suspicious lesions or rashes Cervical Nodes:  No lymphadenopathy noted Inguinal Nodes:  No significant adenopathy Psych:  Cognition and judgment appear intact. Alert and cooperative with normal attention span and concentration. No apparent delusions, illusions, hallucinations  Diabetes Management Exam:    Foot Exam (with socks and/or shoes not present):       Sensory-Monofilament:          Left foot: normal          Right foot: normal   Impression & Recommendations:  Problem # 1:  ALCOHOLISM (ICD-303.90) Assessment Unchanged Strongly advised to abstain from ETOH use --correlation with DM explained. Orders: T-Drug Screen-Urine, (single) [id number])  Problem # 2:  HYPERTENSION (ICD-401.9) Will continue with amlodipine for now.  His updated medication list for this problem includes:    Amlodipine Besylate 10 Mg Tabs (Amlodipine besylate) .Dennis [name]... Take 1 tablet by mouth once a day  BP today:  145/82  Labs Reviewed: K+: 4 (05/06/2010) Creat: : 2.33 (05/06/2010)   Chol: 164 (05/06/2010)   HDL: 12 (05/06/2010)   LDL: 110 (05/06/2010)   TG: 210 (05/06/2010)  Problem # 3:  ACUTE KIDNEY FAILURE UNSPECIFIED (ICD-584.9) Assessment: Comment Only Gentamycin-induced while in the hospital. It is unclear whether the patient still take Metformin since he did not bring his medications with him. Instructed NOT to take metformin until advised otherwise.will check B-met and follow up accordingly. Orders: T-Drug Screen-Urine, (single) [phone])  Labs Reviewed: BUN: 27 (05/06/2010)   Cr: 2.33 (05/06/2010)    Hgb: 11.3 (05/06/2010)   Ca++: 8.0 (05/06/2010)    Alb: 1.8 (05/06/2010)  Problem # 4:  DIABETES MELLITUS, TYPE II (ICD-250.00) Assessment: Deteriorated  CBG's still in 500's (patient uses his spouse's meter). It is unclear whether the patient takes Humulin 70/30 as Rx-ed since did not fill in his Rx for insulin syringes. Spoke with Ms. Victory Dakin. I am very concerned about the patient's condition given that his CBG's ran in 110's upon D/C from the hospital on 05/07/2010 and now they are in 500's. Patient is referred to [facility] as well for an initiation of application process for "orange card." will increase insulin dose to 18 Units in am and 17 units subcutaneously in evening (I was unable to update med list ->would not open). His updated medication list for this problem includes:    Humulin 70/30 70-30 % Susp (Insulin isophane & regular) ..... Inject 15 units in the morning and 15 units in the evening    Aspirin 81 Mg Chew (Aspirin) .Dennis [name]... Take 1 tablet by mouth once a day  Orders: Capillary Blood Glucose/CBG (65784) T- Capillary Blood Glucose (69629) Ophthalmology Referral (Ophthalmology)  Labs Reviewed: Creat: 2.33 (05/06/2010)    Reviewed HgBA1c results: 12.0 (05/06/2010)  Problem # 5:  PROSTATITIS, ACUTE (ICD-601.0) Assessment: Improved UA without nitrites and trace of  leukocytes. patient is asymptomatic.Patient  is to complete his regimen of Cipro (total of 10 days).  Complete Medication List: 1)  Ciprofloxacin  Hcl 500 Mg Tabs (Ciprofloxacin hcl) .... Take 1 tablet by mouth two times a day for 5 days 2)  Humulin 70/30 70-30 % Susp (Insulin isophane & regular) .... Inject 15 units in the morning and 15 units in the evening 3)  Bd Insulin Syringe 25g X 5/8" 1 Ml Misc (Insulin syringe-needle u-100) .... Use to inject insulin twice a day 4)  Ensure Liqd (Nutritional supplements) .Dennis [name].. 1 bottle three times a day 5)  Ranitidine Hcl 300 Mg Caps (Ranitidine hcl) .... Take 1 tablet by mouth once a day 6)  Onetouch Test Strp (Glucose blood) .... Use to check blood sugar three times a day 7)  Blood Glucose Meter Kit (Blood glucose monitoring suppl) .... Use to check blood sugar three times a day 8)  Amlodipine Besylate 10 Mg Tabs (Amlodipine besylate) .... Take 1 tablet by mouth once a day 9)  Aspirin 81 Mg Chew (Aspirin) .... Take 1 tablet by mouth once a day 10)  Fish Oil 500 Mg Caps (Omega-3 fatty acids) .... Take 1 tablet by mouth once a day 11)  Pravastatin Sodium 20 Mg Tabs (Pravastatin sodium) .... Take 1 tablet by mouth once a day  Other Orders: Colonoscopy (Colon)  Patient Instructions: 1)  Please, increase insulin to 18 units before breakfast and 17 units before dinner. 2)  Please, call with any questions. 3)  Please, follow up in 2-4 weeks.   Orders Added: 1)  Capillary Blood Glucose/CBG [82948] 2)  T-Drug Screen-Urine, (single) [[id number]] 3)  Colonoscopy [Colon] 4)  T- Capillary Blood Glucose [82948] 5)  Ophthalmology Referral [Ophthalmology] 6)  New Patient Level IV [99204]   Process Orders Check Orders Results:     [facility]: ABN not required for this insurance Tests Sent for requisitioning (May 11, 2010 4:32 PM):     05/11/2010: [facility] -- T-Drug Screen-Urine, (single) [phone]  (signed)     Prevention & Chronic Care Immunizations   Influenza vaccine: Not documented   Influenza vaccine due: 01/13/2011    Tetanus booster: Not documented   Tetanus booster due: 05/11/2020    Pneumococcal vaccine: Not documented   Pneumococcal vaccine due: 02/14/2012    H. zoster vaccine: Not documented   H. zoster vaccine deferral: Not available  (05/11/2010)  Colorectal Screening   Hemoccult: Not documented   Hemoccult action/deferral: Not indicated  (05/11/2010)    Colonoscopy: Not documented   Colonoscopy action/deferral: GI referral  (05/11/2010)  Other Screening   PSA: Not documented   PSA action/deferral: Discussed-decision deferred  (05/11/2010)   Smoking status: never  (05/11/2010)  Diabetes Mellitus   HgbA1C: 12.0  (05/06/2010)   Hemoglobin A1C due: 08/10/2010    Eye exam: Not documented   Diabetic eye exam action/deferral: Ophthalmology referral  (05/11/2010)    Foot exam: yes  (05/11/2010)   Foot exam action/deferral: Do today   High risk foot: Yes  (05/11/2010)   Foot care education: Done  (05/11/2010)    Urine microalbumin/creatinine ratio: Not documented  Lipids   Total Cholesterol: 164  (05/06/2010)   LDL: 110  (05/06/2010)   LDL Direct: Not documented   HDL: 12  (05/06/2010)   Triglycerides: 210  (05/06/2010)  Hypertension   Last Blood Pressure: 145 / 82  (05/11/2010)   Serum creatinine: 2.33  (05/06/2010)   Serum potassium 4  (05/06/2010)  Self-Management Support :   Personal Goals (by the next clinic visit) :     Personal A1C goal: 7  (05/11/2010)  Personal blood pressure goal: 130/80  (05/11/2010)     Personal LDL goal: 100  (05/11/2010)    Patient will work on the following items until the next clinic visit to reach self-care goals:     Medications and monitoring: take my medicines every day, bring all of my medications to every visit, examine my feet every day  (05/11/2010)     Eating: eat more vegetables, use fresh or  frozen vegetables, eat foods that are low in salt, eat baked foods instead of fried foods  (05/11/2010)     Activity: take a 30 minute walk every day  (05/11/2010)    Diabetes self-management support: Copy of home glucose meter record, Education handout, Written self-care plan  (05/11/2010)   Diabetes care plan printed   Diabetes education handout printed    Hypertension self-management support: Copy of home glucose meter record, Education handout, Written self-care plan  (05/11/2010)   Hypertension self-care plan printed.   Hypertension education handout printed   Nursing Instructions: Give Flu vaccine today Give tetanus booster today Give Pneumovax today GI referral for screening colonoscopy (see order) CBG today (see order) Refer for screening diabetic eye exam (see order) Diabetic foot exam today   Process Orders Check Orders Results:     [facility]: ABN not required for this insurance Tests Sent for requisitioning (May 11, 2010 4:32 PM):     05/11/2010: [facility] -- T-Drug Screen-Urine, (single) [phone] (signed)     Laboratory Results   Urine Tests  Date/Time Recieved: 05/11/10  4:00PM Date/Time Reported: 05/11/10  4:00PM  Routine Urinalysis   Color: lt. yellow Glucose: 250   (Normal Range: Negative) Bilirubin: negative   (Normal Range: Negative) Ketone: negative   (Normal Range: Negative) Spec. Gravity: 1.010   (Normal Range: 1.003-1.035) Blood: trace-intact   (Normal Range: Negative) pH: 5.0   (Normal Range: 5.0-8.0) Protein: trace   (Normal Range: Negative) Urobilinogen: 0.2   (Normal Range: 0-1) Nitrite: negative   (Normal Range: Negative) Leukocyte Esterace: small   (Normal Range: Negative)     Blood Tests     CBG Random: 326

## 2010-06-15 NOTE — Progress Notes (Signed)
Summary: refill/ hla  Phone Note Refill Request Message from:  Fax from Pharmacy on June 01, 2010 5:24 PM  Refills Requested: Medication #1:  PRAVASTATIN SODIUM 20 MG TABS Take 1 tablet by mouth once a day   Dosage confirmed as above?Dosage Confirmed   Last Refilled: 12/27 appt scheduled for 1/27  Initial call taken by: Marin Roberts RN,  June 01, 2010 5:25 PM  Follow-up for Phone Call       Follow-up by: Bethel Born MD,  June 02, 2010 9:38 AM    Prescriptions: PRAVASTATIN SODIUM 20 MG TABS (PRAVASTATIN SODIUM) Take 1 tablet by mouth once a day  #30 x 6   Entered and Authorized by:   Bethel Born MD   Signed by:   Bethel Born MD on 06/02/2010   Method used:   Electronically to        [facility] [phone]* (retail)       [address]       [place], [place]  [address]       Ph: [phone]       Fax: [phone]   RxID:   [id number]

## 2010-06-20 ENCOUNTER — Other Ambulatory Visit: Payer: Self-pay | Admitting: Internal Medicine

## 2010-06-20 ENCOUNTER — Encounter: Payer: Self-pay | Admitting: Internal Medicine

## 2010-06-20 MED ORDER — INSULIN NPH ISOPHANE & REGULAR (70-30) 100 UNIT/ML ~~LOC~~ SUSP: SUBCUTANEOUS | Status: DC

## 2010-07-06 ENCOUNTER — Ambulatory Visit (INDEPENDENT_AMBULATORY_CARE_PROVIDER_SITE_OTHER): Payer: Self-pay | Admitting: Dietician

## 2010-07-06 ENCOUNTER — Encounter: Payer: Self-pay | Admitting: Dietician

## 2010-07-06 DIAGNOSIS — E119 Type 2 diabetes mellitus without complications: Secondary | ICD-10-CM

## 2010-07-06 NOTE — Progress Notes (Signed)
Diabetes Self-Management Training (DSMT)  Follow-Up 2 Visit  07/06/2010 [name], identified by name and date of birth, is a 65 y.o. male with Type 2 Diabetes. Year of diabetes diagnosis: 2011 Other persons present: no- wife is sick  ASSESSMENT Patient concerns are Nutrition/meal planning, Healthy Lifestyle, Problem solving and Glycemic control.  Height 5' 11.5" (1.816 m), weight 207 lb 9.6 oz (94.167 kg). Body mass index is 28.55 kg/(m^2). No results found for this basename: [facility]   Lab Results  Component Value Date   HGBA1C 12.0 05/06/2010   Medication Nutrition Monitor: True track  Labs reviewed.  DIABETES BUNDLE: A1C in past 6 months? Yes.  Less than 7%? No LDL in past year? Yes-110. Microalbumin ratio in past year?yes. Patient taking ACE or ARB? Yes. Blood pressure less than 130/80? Yes. Foot exam in last year? Yes. Eye exam in past year? No.  Reminded patient to schedule eye exam. Tobacco use? No. Pneumovax? No Flu vaccine? Yes Asprin? Yes  Family history of diabetes: Publishing copy  Support systems: spouse  Special needs: None  Prior DM Education: Yes   Medications See Medications list.  Has adequate knowledge and Is interested in learning more  Patients belief/attitude about diabetes: Diabetes can be controlled.  Self foot exams daily: No  Diabetes Complications: None   Exercise Plan Doing ADLs and walking for 15 minutes about 3x a week.   Self-Monitoring Frequency of testing: 2 times/day Breakfast: 100-150 Lunch: NA Supper: 100-300 Bedtime: 120 with one 60 which he had no symptoms and thought too much alcohol was on his finger, but did not follow up with another check and one 200  Hyperglycemia: Yes Rarely Hypoglycemia: No   Meal Planning Some knowledge and Interested in improving   Assessment comments: her for 3rd follow up, asking if he can drink a beer every now and then. Because of his alcoholism and risk of hypoglycemia was  discussed continued abstaining from alcohol    INDIVIDUAL DIABETES EDUCATION PLAN:  Nutrition management Physical activity and exercise Acute complication: Chronic complications Goal setting _______________________________________________________________________  Intervention TOPICS COVERED TODAY:  Nutrition management  Role of diet in the treatment of diabetes and the relationship between the three main macronutritents and blood glucose control. Reviewed blood glucose goals for pre and post meals and how to evaluate the patients' food intake on their blood glucose level. Effects of alcohol on blood glucose and safety factors with consumption of alcohol. Meal options for control of blood glucose level and chronic complications. Physical activity and exercise  Role of exercise on diabetes management, blood pressure control and cardiac health. Helped patient identify appropriate exercises in relation to his/her diabetes, diabetes complications and other health issue. Acute complication  Taught treatment of hypoglycemia - the 15 rule. Discussed and identified patients' treatment of hyperglycemia. Chronic complications  Retinopathy and reason for yearly dilated eye exams Goal setting  Helped patient develop diabetes management plan for increased physical activity and weight managment  PATIENTS GOALS/PLAN (copy and paste in patient instructions so patient receives a copy): 1.  Learning Objective:       Know foods that affect blood sugar and what he can eat when hungry and wants to eat more food without ill affect on blood sugar and weight. State symptoms of hyper and hypo glycemia and what to do. State is exercising daily and safety precautions 2.  Behavioral Objective:         Nutrition: To improve blood glucose control I will follow meal plan of  using plate method half a plate of vegetables, lean protien and limited and consistent amounts of carbs Half of the time 50% Physical Activity: For  improved blood glucose control and decrease insulin resistance, I will exercise 6 days a week  Sometimes 25% Reducing Risk: To decrease the risk for complications, I will treat hypoglycemia with 15 grams of carbs if blood glucose less than 70 mg/dl and schedule and get annual eye exam  Sometimes 25%  Personalized Follow-Up Plan for Ongoing Self Management Support:  Doctor's Office, family and CDE visits ______________________________________________________________________   Outcomes Expected outcomes: Demonstrated interest in learning.Expect positive changes in lifestyle.  Self-care Barriers: Lack of material resources  Education material provided: blood sugar download  Patient to contact team via Phone if problems or questions.  Time in: 0900     Time out: 0930  Future DSMT - 4-6 wks   DennisDONNA

## 2010-07-06 NOTE — Patient Instructions (Addendum)
Great job caring for your diabetes.  You said you want to work on for the next month- exercise daily- walk for 30 minutes every day.                                    watch portions, eat more nuts, vegetables, meat roll-ups, lower carb foods  Please make an eye appointment to see if the diabetes is affecting your eyes.   Call us if you have low blood sugar < 70 as discussed.   Please make a follow up appointment for 1 month.

## 2010-07-13 ENCOUNTER — Other Ambulatory Visit: Payer: Self-pay | Admitting: *Deleted

## 2010-07-13 MED ORDER — LISINOPRIL 5 MG PO TABS: ORAL_TABLET | ORAL | Status: DC

## 2010-07-20 ENCOUNTER — Other Ambulatory Visit: Payer: Self-pay | Admitting: *Deleted

## 2010-07-20 MED ORDER — AMLODIPINE BESYLATE 10 MG PO TABS: 10.0000 mg | ORAL_TABLET | Freq: Every day | ORAL | Status: DC

## 2010-07-24 LAB — GLUCOSE, CAPILLARY
Glucose-Capillary: 104 mg/dL — ABNORMAL HIGH (ref 70–99)
Glucose-Capillary: 116 mg/dL — ABNORMAL HIGH (ref 70–99)
Glucose-Capillary: 118 mg/dL — ABNORMAL HIGH (ref 70–99)
Glucose-Capillary: 129 mg/dL — ABNORMAL HIGH (ref 70–99)
Glucose-Capillary: 130 mg/dL — ABNORMAL HIGH (ref 70–99)
Glucose-Capillary: 138 mg/dL — ABNORMAL HIGH (ref 70–99)
Glucose-Capillary: 146 mg/dL — ABNORMAL HIGH (ref 70–99)
Glucose-Capillary: 150 mg/dL — ABNORMAL HIGH (ref 70–99)
Glucose-Capillary: 156 mg/dL — ABNORMAL HIGH (ref 70–99)
Glucose-Capillary: 157 mg/dL — ABNORMAL HIGH (ref 70–99)
Glucose-Capillary: 158 mg/dL — ABNORMAL HIGH (ref 70–99)
Glucose-Capillary: 165 mg/dL — ABNORMAL HIGH (ref 70–99)
Glucose-Capillary: 173 mg/dL — ABNORMAL HIGH (ref 70–99)
Glucose-Capillary: 189 mg/dL — ABNORMAL HIGH (ref 70–99)
Glucose-Capillary: 190 mg/dL — ABNORMAL HIGH (ref 70–99)
Glucose-Capillary: 203 mg/dL — ABNORMAL HIGH (ref 70–99)
Glucose-Capillary: 217 mg/dL — ABNORMAL HIGH (ref 70–99)
Glucose-Capillary: 247 mg/dL — ABNORMAL HIGH (ref 70–99)
Glucose-Capillary: 279 mg/dL — ABNORMAL HIGH (ref 70–99)
Glucose-Capillary: 326 mg/dL — ABNORMAL HIGH (ref 70–99)
Glucose-Capillary: 355 mg/dL — ABNORMAL HIGH (ref 70–99)
Glucose-Capillary: 69 mg/dL — ABNORMAL LOW (ref 70–99)
Glucose-Capillary: 90 mg/dL (ref 70–99)
Glucose-Capillary: 91 mg/dL (ref 70–99)
Glucose-Capillary: 95 mg/dL (ref 70–99)

## 2010-07-24 LAB — COMPREHENSIVE METABOLIC PANEL
ALT: 25 U/L (ref 0–53)
ALT: 46 U/L (ref 0–53)
ALT: 49 U/L (ref 0–53)
AST: 30 U/L (ref 0–37)
AST: 90 U/L — ABNORMAL HIGH (ref 0–37)
AST: 92 U/L — ABNORMAL HIGH (ref 0–37)
Albumin: 1.5 g/dL — ABNORMAL LOW (ref 3.5–5.2)
Albumin: 2.1 g/dL — ABNORMAL LOW (ref 3.5–5.2)
BUN: 15 mg/dL (ref 6–23)
BUN: 27 mg/dL — ABNORMAL HIGH (ref 6–23)
CO2: 19 mEq/L (ref 19–32)
CO2: 22 mEq/L (ref 19–32)
Calcium: 7.6 mg/dL — ABNORMAL LOW (ref 8.4–10.5)
Calcium: 7.6 mg/dL — ABNORMAL LOW (ref 8.4–10.5)
Calcium: 7.9 mg/dL — ABNORMAL LOW (ref 8.4–10.5)
Calcium: 8 mg/dL — ABNORMAL LOW (ref 8.4–10.5)
Calcium: 8 mg/dL — ABNORMAL LOW (ref 8.4–10.5)
Calcium: 9 mg/dL (ref 8.4–10.5)
Chloride: 110 mEq/L (ref 96–112)
Creatinine, Ser: 0.76 mg/dL (ref 0.4–1.5)
Creatinine, Ser: 2.28 mg/dL — ABNORMAL HIGH (ref 0.4–1.5)
Creatinine, Ser: 2.33 mg/dL — ABNORMAL HIGH (ref 0.4–1.5)
Creatinine, Ser: 2.37 mg/dL — ABNORMAL HIGH (ref 0.4–1.5)
GFR calc Af Amer: 33 mL/min — ABNORMAL LOW (ref >60)
GFR calc Af Amer: 34 mL/min — ABNORMAL LOW (ref >60)
GFR calc Af Amer: 34 mL/min — ABNORMAL LOW (ref >60)
GFR calc Af Amer: 35 mL/min — ABNORMAL LOW (ref >60)
GFR calc Af Amer: >60 mL/min (ref >60)
GFR calc non Af Amer: 27 mL/min — ABNORMAL LOW (ref >60)
GFR calc non Af Amer: 29 mL/min — ABNORMAL LOW (ref >60)
Glucose, Bld: 346 mg/dL — ABNORMAL HIGH (ref 70–99)
Glucose, Bld: 88 mg/dL (ref 70–99)
Glucose, Bld: 94 mg/dL (ref 70–99)
Sodium: 132 mEq/L — ABNORMAL LOW (ref 135–145)
Sodium: 135 mEq/L (ref 135–145)
Sodium: 137 mEq/L (ref 135–145)
Sodium: 140 mEq/L (ref 135–145)
Total Bilirubin: 1.4 mg/dL — ABNORMAL HIGH (ref 0.3–1.2)
Total Protein: 5.9 g/dL — ABNORMAL LOW (ref 6.0–8.3)
Total Protein: 6.8 g/dL (ref 6.0–8.3)
Total Protein: 6.9 g/dL (ref 6.0–8.3)
Total Protein: 9 g/dL — ABNORMAL HIGH (ref 6.0–8.3)

## 2010-07-24 LAB — CULTURE, BLOOD (ROUTINE X 2)
Culture  Setup Time: 201112150214
Culture  Setup Time: 201112181748
Culture: NO GROWTH
Culture: NO GROWTH

## 2010-07-24 LAB — BASIC METABOLIC PANEL
BUN: 14 mg/dL (ref 6–23)
BUN: 17 mg/dL (ref 6–23)
BUN: 28 mg/dL — ABNORMAL HIGH (ref 6–23)
BUN: 30 mg/dL — ABNORMAL HIGH (ref 6–23)
BUN: 31 mg/dL — ABNORMAL HIGH (ref 6–23)
BUN: 7 mg/dL (ref 6–23)
BUN: 9 mg/dL (ref 6–23)
CO2: 17 mEq/L — ABNORMAL LOW (ref 19–32)
CO2: 19 mEq/L (ref 19–32)
CO2: 24 mEq/L (ref 19–32)
CO2: 27 mEq/L (ref 19–32)
Calcium: 7.9 mg/dL — ABNORMAL LOW (ref 8.4–10.5)
Calcium: 7.9 mg/dL — ABNORMAL LOW (ref 8.4–10.5)
Calcium: 7.9 mg/dL — ABNORMAL LOW (ref 8.4–10.5)
Calcium: 8 mg/dL — ABNORMAL LOW (ref 8.4–10.5)
Calcium: 8.2 mg/dL — ABNORMAL LOW (ref 8.4–10.5)
Chloride: 101 mEq/L (ref 96–112)
Chloride: 102 mEq/L (ref 96–112)
Chloride: 109 mEq/L (ref 96–112)
Chloride: 109 mEq/L (ref 96–112)
Creatinine, Ser: 0.68 mg/dL (ref 0.4–1.5)
Creatinine, Ser: 0.83 mg/dL (ref 0.4–1.5)
Creatinine, Ser: 1.92 mg/dL — ABNORMAL HIGH (ref 0.4–1.5)
Creatinine, Ser: 2.08 mg/dL — ABNORMAL HIGH (ref 0.4–1.5)
Creatinine, Ser: 2.18 mg/dL — ABNORMAL HIGH (ref 0.4–1.5)
Creatinine, Ser: 2.45 mg/dL — ABNORMAL HIGH (ref 0.4–1.5)
GFR calc Af Amer: 32 mL/min — ABNORMAL LOW (ref >60)
GFR calc Af Amer: 37 mL/min — ABNORMAL LOW (ref >60)
GFR calc Af Amer: 39 mL/min — ABNORMAL LOW (ref >60)
GFR calc Af Amer: 43 mL/min — ABNORMAL LOW (ref >60)
GFR calc Af Amer: >60 mL/min (ref >60)
GFR calc non Af Amer: 27 mL/min — ABNORMAL LOW (ref >60)
GFR calc non Af Amer: >60 mL/min (ref >60)
Glucose, Bld: 107 mg/dL — ABNORMAL HIGH (ref 70–99)
Glucose, Bld: 111 mg/dL — ABNORMAL HIGH (ref 70–99)
Glucose, Bld: 121 mg/dL — ABNORMAL HIGH (ref 70–99)
Glucose, Bld: 190 mg/dL — ABNORMAL HIGH (ref 70–99)
Glucose, Bld: 268 mg/dL — ABNORMAL HIGH (ref 70–99)
Potassium: 3.3 mEq/L — ABNORMAL LOW (ref 3.5–5.1)
Potassium: 3.6 mEq/L (ref 3.5–5.1)
Potassium: 3.8 mEq/L (ref 3.5–5.1)
Potassium: 4.2 mEq/L (ref 3.5–5.1)
Potassium: 4.5 mEq/L (ref 3.5–5.1)
Sodium: 141 mEq/L (ref 135–145)

## 2010-07-24 LAB — CBC
HCT: 29.8 % — ABNORMAL LOW (ref 39.0–52.0)
HCT: 32.9 % — ABNORMAL LOW (ref 39.0–52.0)
HCT: 33.9 % — ABNORMAL LOW (ref 39.0–52.0)
HCT: 34.5 % — ABNORMAL LOW (ref 39.0–52.0)
HCT: 37.2 % — ABNORMAL LOW (ref 39.0–52.0)
HCT: 41.8 % (ref 39.0–52.0)
Hemoglobin: 10 g/dL — ABNORMAL LOW (ref 13.0–17.0)
Hemoglobin: 10.3 g/dL — ABNORMAL LOW (ref 13.0–17.0)
Hemoglobin: 11.9 g/dL — ABNORMAL LOW (ref 13.0–17.0)
Hemoglobin: 9.8 g/dL — ABNORMAL LOW (ref 13.0–17.0)
MCH: 27.2 pg (ref 26.0–34.0)
MCH: 27.2 pg (ref 26.0–34.0)
MCH: 27.7 pg (ref 26.0–34.0)
MCH: 27.9 pg (ref 26.0–34.0)
MCH: 28 pg (ref 26.0–34.0)
MCH: 28.2 pg (ref 26.0–34.0)
MCH: 28.5 pg (ref 26.0–34.0)
MCH: 28.8 pg (ref 26.0–34.0)
MCHC: 33 g/dL (ref 30.0–36.0)
MCHC: 33.6 g/dL (ref 30.0–36.0)
MCHC: 34.1 g/dL (ref 30.0–36.0)
MCHC: 34.2 g/dL (ref 30.0–36.0)
MCHC: 34.2 g/dL (ref 30.0–36.0)
MCHC: 34.3 g/dL (ref 30.0–36.0)
MCHC: 34.3 g/dL (ref 30.0–36.0)
MCHC: 34.3 g/dL (ref 30.0–36.0)
MCV: 81.2 fL (ref 78.0–100.0)
MCV: 81.4 fL (ref 78.0–100.0)
Platelets: 121 10*3/uL — ABNORMAL LOW (ref 150–400)
Platelets: 141 10*3/uL — ABNORMAL LOW (ref 150–400)
Platelets: 289 10*3/uL (ref 150–400)
Platelets: 511 10*3/uL — ABNORMAL HIGH (ref 150–400)
Platelets: 600 10*3/uL — ABNORMAL HIGH (ref 150–400)
Platelets: 81 10*3/uL — ABNORMAL LOW (ref 150–400)
Platelets: 88 10*3/uL — ABNORMAL LOW (ref 150–400)
RBC: 3.68 MIL/uL — ABNORMAL LOW (ref 4.22–5.81)
RBC: 3.75 MIL/uL — ABNORMAL LOW (ref 4.22–5.81)
RBC: 4.13 MIL/uL — ABNORMAL LOW (ref 4.22–5.81)
RBC: 4.17 MIL/uL — ABNORMAL LOW (ref 4.22–5.81)
RBC: 4.24 MIL/uL (ref 4.22–5.81)
RBC: 4.47 MIL/uL (ref 4.22–5.81)
RDW: 13 % (ref 11.5–15.5)
RDW: 13.2 % (ref 11.5–15.5)
RDW: 13.5 % (ref 11.5–15.5)
RDW: 14.2 % (ref 11.5–15.5)
RDW: 14.4 % (ref 11.5–15.5)
RDW: 14.7 % (ref 11.5–15.5)
RDW: 14.7 % (ref 11.5–15.5)
WBC: 10.7 10*3/uL — ABNORMAL HIGH (ref 4.0–10.5)
WBC: 11.6 10*3/uL — ABNORMAL HIGH (ref 4.0–10.5)
WBC: 13.9 10*3/uL — ABNORMAL HIGH (ref 4.0–10.5)

## 2010-07-24 LAB — URINALYSIS, ROUTINE W REFLEX MICROSCOPIC
Bilirubin Urine: NEGATIVE
Nitrite: NEGATIVE
Specific Gravity, Urine: 1.039 — ABNORMAL HIGH (ref 1.005–1.030)
Urobilinogen, UA: 1 mg/dL (ref 0.0–1.0)
pH: 5.5 (ref 5.0–8.0)

## 2010-07-24 LAB — HEPATIC FUNCTION PANEL
ALT: 46 U/L (ref 0–53)
Bilirubin, Direct: 1.6 mg/dL — ABNORMAL HIGH (ref 0.0–0.3)
Indirect Bilirubin: 0.6 mg/dL (ref 0.3–0.9)

## 2010-07-24 LAB — IRON AND TIBC
Iron: 22 ug/dL — ABNORMAL LOW (ref 42–135)
Saturation Ratios: 11 % — ABNORMAL LOW (ref 20–55)
TIBC: 194 ug/dL — ABNORMAL LOW (ref 215–435)
UIBC: 172 ug/dL

## 2010-07-24 LAB — URINE MICROSCOPIC-ADD ON

## 2010-07-24 LAB — DIFFERENTIAL
Basophils Absolute: 0 10*3/uL (ref 0.0–0.1)
Basophils Absolute: 0 10*3/uL (ref 0.0–0.1)
Eosinophils Absolute: 0.1 10*3/uL (ref 0.0–0.7)
Lymphocytes Relative: 10 % — ABNORMAL LOW (ref 12–46)
Lymphocytes Relative: 9 % — ABNORMAL LOW (ref 12–46)
Lymphs Abs: 0.9 10*3/uL (ref 0.7–4.0)
Lymphs Abs: 1.2 10*3/uL (ref 0.7–4.0)
Lymphs Abs: 1.4 10*3/uL (ref 0.7–4.0)
Monocytes Relative: 7 % (ref 3–12)
Monocytes Relative: 7 % (ref 3–12)
Neutro Abs: 11.7 10*3/uL — ABNORMAL HIGH (ref 1.7–7.7)
Neutro Abs: 13.5 10*3/uL — ABNORMAL HIGH (ref 1.7–7.7)
Neutrophils Relative %: 83 % — ABNORMAL HIGH (ref 43–77)
Neutrophils Relative %: 87 % — ABNORMAL HIGH (ref 43–77)

## 2010-07-24 LAB — HEMOGLOBIN A1C
Hgb A1c MFr Bld: 12 % — ABNORMAL HIGH (ref <5.7)
Mean Plasma Glucose: 298 mg/dL — ABNORMAL HIGH (ref <117)

## 2010-07-24 LAB — PROTEIN / CREATININE RATIO, URINE
Creatinine, Urine: 123.2 mg/dL
Protein Creatinine Ratio: 0.45 — ABNORMAL HIGH (ref 0.00–0.15)
Total Protein, Urine: 56 mg/dL

## 2010-07-24 LAB — LIPID PANEL
Cholesterol: 135 mg/dL (ref 0–200)
Cholesterol: 164 mg/dL (ref 0–200)
LDL Cholesterol: 110 mg/dL — ABNORMAL HIGH (ref 0–99)
Triglycerides: 144 mg/dL (ref <150)

## 2010-07-24 LAB — URINE CULTURE: Colony Count: >=100000

## 2010-07-24 LAB — VITAMIN B12: Vitamin B-12: 1687 pg/mL — ABNORMAL HIGH (ref 211–911)

## 2010-07-24 LAB — FOLATE: Folate: 8.4 ng/mL

## 2010-07-24 LAB — GENTAMICIN LEVEL, TROUGH: Gentamicin Trough: 5.1 ug/mL (ref 0.5–2.0)

## 2010-07-24 LAB — FERRITIN: Ferritin: 734 ng/mL — ABNORMAL HIGH (ref 22–322)

## 2010-07-24 LAB — MICROALBUMIN / CREATININE URINE RATIO: Microalb, Ur: 3.01 mg/dL — ABNORMAL HIGH (ref 0.00–1.89)

## 2010-07-24 LAB — BILIRUBIN, FRACTIONATED(TOT/DIR/INDIR): Total Bilirubin: 2.7 mg/dL — ABNORMAL HIGH (ref 0.3–1.2)

## 2010-07-24 LAB — HEMOCCULT GUIAC POC 1CARD (OFFICE): Fecal Occult Bld: NEGATIVE

## 2010-07-24 LAB — GENTAMICIN LEVEL, RANDOM: Gentamicin Rm: 3.3 ug/mL

## 2010-08-15 ENCOUNTER — Encounter: Payer: Self-pay | Admitting: Internal Medicine

## 2010-09-12 ENCOUNTER — Encounter: Payer: Self-pay | Admitting: Internal Medicine

## 2010-09-12 ENCOUNTER — Ambulatory Visit (INDEPENDENT_AMBULATORY_CARE_PROVIDER_SITE_OTHER): Payer: Self-pay | Admitting: Internal Medicine

## 2010-09-12 VITALS — BP 145/70 | HR 96 | Temp 98.8°F | Ht 72.0 in | Wt 220.2 lb

## 2010-09-12 DIAGNOSIS — E119 Type 2 diabetes mellitus without complications: Secondary | ICD-10-CM

## 2010-09-12 DIAGNOSIS — I10 Essential (primary) hypertension: Secondary | ICD-10-CM

## 2010-09-12 DIAGNOSIS — E785 Hyperlipidemia, unspecified: Secondary | ICD-10-CM

## 2010-09-12 DIAGNOSIS — N179 Acute kidney failure, unspecified: Secondary | ICD-10-CM

## 2010-09-12 DIAGNOSIS — F1020 Alcohol dependence, uncomplicated: Secondary | ICD-10-CM

## 2010-09-12 NOTE — Patient Instructions (Signed)
Bring all your medications at next visit Follow up appointment in 3 months

## 2010-09-12 NOTE — Assessment & Plan Note (Signed)
At goal based on his risk factors

## 2010-09-12 NOTE — Assessment & Plan Note (Signed)
HbA1c 7.2. Compliant with his insulin 70/30 mix. Takes 23 units in the morning and 17 units in the evening. No symptoms of hypoglycemia. His diabetes seems to be improving and we'll continue current management without any changes. Has not been seen by a foot doctor or an ophthalmologist but does not have insurance and this would be difficult. Performed foot exam today no evidence of callus, also on other changes. No vision changes.

## 2010-09-12 NOTE — Assessment & Plan Note (Signed)
He has reduced drinking alcohol since December 2012. He says he drinks one or 2 beer occasionally

## 2010-09-12 NOTE — Progress Notes (Signed)
  Subjective:    Patient ID: Dennis [name], male    DOB: 27-May-1945, 65 y.o.   MRN: [id number]  HPI    Review of Systems     Objective:   Physical Exam        Assessment & Plan:

## 2010-09-12 NOTE — Assessment & Plan Note (Signed)
check lipid profile today.

## 2010-09-12 NOTE — Assessment & Plan Note (Signed)
His kidney function seemed to be improving at the time of discharge from the hospital. Recheck CMET today.

## 2010-09-12 NOTE — Progress Notes (Signed)
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 He does not have any complaints today. His last clinic visit was in January 2012   diabetes: He takes his insulin 70/30 mix regularly. Checks his blood sugars at home and says most of them are usually less than 200. No complaints suggestive of hypoglycemia. Checks his feet regularly and has no damages. His vision hasn't changed.  Hypertension: Takes his medications regularly. Does not check it at home. No symptoms suggestive of hypotension.  He quit alcohol in December 2011, has not had anything since then. No smoking no drugs.  BP 145/70  Pulse 96  Temp(Src) 98.8 F (37.1 C) (Oral)  Ht 6' (1.829 m)  Wt 220 lb 3.2 oz (99.882 kg)  BMI 29.86 kg/m2  SpO2 99%  General Appearance:    Alert, cooperative, no distress, appears stated age  Head:    Normocephalic, without obvious abnormality, atraumatic  Eyes:    PERRL, conjunctiva/corneas clear, EOM's intact, fundi    benign, both eyes       Ears:    Normal TM's and external ear canals, both ears  Nose:   Nares normal, septum midline, mucosa normal, no drainage   or sinus tenderness  Throat:   Lips, mucosa, and tongue normal; teeth and gums normal  Neck:   Supple, symmetrical, trachea midline, no adenopathy;       thyroid:  No enlargement/tenderness/nodules; no carotid   bruit or JVD  Back:     Symmetric, no curvature, ROM normal, no CVA tenderness  Lungs:     Clear to auscultation bilaterally, respirations unlabored  Chest wall:    No tenderness or deformity  Heart:    Regular rate and rhythm, S1 and S2 normal, no murmur, rub   or gallop  Abdomen:     Soft, non-tender, bowel sounds active all four quadrants,    no masses, no organomegaly        Extremities:   Extremities normal,  atraumatic, no cyanosis or edema  Pulses:   2+ and symmetric all extremities  Skin:   Skin color, texture, turgor normal, no rashes or lesions  Lymph nodes:   Cervical, supraclavicular, and axillary nodes normal  Neurologic:   CNII-XII intact. Normal strength, sensation and reflexes      throughout    Review of Systems - General ROS: negative Respiratory ROS: no cough, shortness of breath, or wheezing Cardiovascular ROS: no chest pain or dyspnea on exertion Gastrointestinal ROS: no abdominal pain, change in bowel habits, or black or bloody stools Musculoskeletal ROS: negative Neurological ROS: no TIA or stroke symptoms

## 2010-09-14 ENCOUNTER — Telehealth: Payer: Self-pay | Admitting: *Deleted

## 2010-09-14 NOTE — Telephone Encounter (Signed)
Call from pt's wife.  Pt has an lab appointment in the am for fasting labs. Pt usually takes his insulin in the am along with his otherr medications.  Question is can he hold his insulin until after his labs are done so that he will not eat.  Spoke with Tobey Bride.  Pt can hold his Insulin until after his labs and then take after going back home to eat. As usual.  Pt's wife voiced understanding of the plan.

## 2010-09-15 ENCOUNTER — Other Ambulatory Visit: Payer: Self-pay

## 2010-09-15 DIAGNOSIS — E785 Hyperlipidemia, unspecified: Secondary | ICD-10-CM

## 2010-09-15 DIAGNOSIS — F1020 Alcohol dependence, uncomplicated: Secondary | ICD-10-CM

## 2010-09-15 DIAGNOSIS — I10 Essential (primary) hypertension: Secondary | ICD-10-CM

## 2010-09-15 DIAGNOSIS — N179 Acute kidney failure, unspecified: Secondary | ICD-10-CM

## 2010-09-15 DIAGNOSIS — E119 Type 2 diabetes mellitus without complications: Secondary | ICD-10-CM

## 2010-09-16 LAB — LIPID PANEL
HDL: 36 mg/dL — ABNORMAL LOW (ref >39)
LDL Cholesterol: 130 mg/dL — ABNORMAL HIGH (ref 0–99)
Triglycerides: 155 mg/dL — ABNORMAL HIGH (ref <150)
VLDL: 31 mg/dL (ref 0–40)

## 2010-09-16 LAB — COMPREHENSIVE METABOLIC PANEL
ALT: 27 U/L (ref 0–53)
CO2: 25 mEq/L (ref 19–32)
Creat: 0.92 mg/dL (ref 0.40–1.50)
Total Bilirubin: 0.4 mg/dL (ref 0.3–1.2)

## 2011-01-02 ENCOUNTER — Ambulatory Visit (INDEPENDENT_AMBULATORY_CARE_PROVIDER_SITE_OTHER): Payer: Self-pay | Admitting: Internal Medicine

## 2011-01-02 ENCOUNTER — Encounter: Payer: Self-pay | Admitting: Internal Medicine

## 2011-01-02 DIAGNOSIS — E119 Type 2 diabetes mellitus without complications: Secondary | ICD-10-CM

## 2011-01-02 DIAGNOSIS — E785 Hyperlipidemia, unspecified: Secondary | ICD-10-CM

## 2011-01-02 DIAGNOSIS — I10 Essential (primary) hypertension: Secondary | ICD-10-CM

## 2011-01-02 DIAGNOSIS — Z23 Encounter for immunization: Secondary | ICD-10-CM

## 2011-01-02 MED ORDER — LISINOPRIL 5 MG PO TABS: ORAL_TABLET | ORAL | Status: DC

## 2011-01-02 MED ORDER — INSULIN NPH ISOPHANE & REGULAR (70-30) 100 UNIT/ML ~~LOC~~ SUSP: SUBCUTANEOUS | Status: DC

## 2011-01-02 MED ORDER — PRAVASTATIN SODIUM 20 MG PO TABS: 20.0000 mg | ORAL_TABLET | Freq: Every day | ORAL | Status: DC

## 2011-01-02 NOTE — Progress Notes (Signed)
Addended by: Bufford Spikes on: 01/02/2011 04:39 PM   Modules accepted: Orders

## 2011-01-02 NOTE — Progress Notes (Deleted)
  Subjective:    Patient ID: Dennis [name], male    DOB: 1945/11/06, 65 y.o.   MRN: [id number]  HPI    Review of Systems     Objective:   Physical Exam        Assessment & Plan:

## 2011-01-02 NOTE — Assessment & Plan Note (Signed)
Acceptable

## 2011-01-02 NOTE — Patient Instructions (Signed)
Come fasting at next visit Will see you again in 3 months.

## 2011-01-02 NOTE — Assessment & Plan Note (Signed)
Not fasting today. Lipid profile at next visit. On pravastatin 20 mg  which may need  to be increased. has some muscle pain today so would hold off.

## 2011-01-02 NOTE — Assessment & Plan Note (Signed)
Check HbA1c. Continue current insulin dose. Refuses ophthalmology exam. Foot exam normal.

## 2011-01-02 NOTE — Progress Notes (Signed)
65 year old man with past medical history of hypertension, diabetes, hyperlipidemia. He comes to the clinic for follow. No complaints today. Compliant with his medications. Check CBGs at home which range between 150-200. Requests medication refills. Any hard liquor drinking but drinks one to 2 beers a week. Does not smoke. does not do drugs.  Physical exam BP 132/69  Pulse 67  Temp(Src) 99.5 F (37.5 C) (Oral)  Ht 6' (1.829 m)  Wt 229 lb 8 oz (104.101 kg)  BMI 31.13 kg/m2  SpO2 100%  General Appearance:    Alert, cooperative, no distress, appears stated age  Head:    Normocephalic, without obvious abnormality, atraumatic  Eyes:    PERRL, conjunctiva/corneas clear, EOM's intact, fundi    benign, both eyes       Ears:    Normal TM's and external ear canals, both ears  Nose:   Nares normal, septum midline, mucosa normal, no drainage   or sinus tenderness  Throat:   Lips, mucosa, and tongue normal; teeth and gums normal  Neck:   Supple, symmetrical, trachea midline, no adenopathy;       thyroid:  No enlargement/tenderness/nodules; no carotid   bruit or JVD  Back:     Symmetric, no curvature, ROM normal, no CVA tenderness  Lungs:     Clear to auscultation bilaterally, respirations unlabored  Chest wall:    No tenderness or deformity  Heart:    Regular rate and rhythm, S1 and S2 normal, no murmur, rub   or gallop  Abdomen:     Soft, non-tender, bowel sounds active all four quadrants,    no masses, no organomegaly  Extremities:   Extremities normal, atraumatic, no cyanosis or edema  Pulses:   2+ and symmetric all extremities  Skin:   Skin color, texture, turgor normal, no rashes or lesions  Lymph nodes:   Cervical, supraclavicular, and axillary nodes normal  Neurologic:   CNII-XII intact. Normal strength, sensation and reflexes      throughout   Review of systems  Constitutional: Denies fever, chills, diaphoresis, appetite change and fatigue.  HEENT: Denies photophobia, eye pain,  redness, hearing loss, ear pain, congestion, sore throat, rhinorrhea, sneezing, mouth sores, trouble swallowing, neck pain, neck stiffness and tinnitus.   Respiratory: Denies SOB, DOE, cough, chest tightness,  and wheezing.   Cardiovascular: Denies chest pain, palpitations and leg swelling.  Gastrointestinal: Denies nausea, vomiting, abdominal pain, diarrhea, constipation, blood in stool and abdominal distention.  Genitourinary: Denies dysuria, urgency, frequency, hematuria, flank pain and difficulty urinating.  Musculoskeletal: Denies myalgias, back pain, joint swelling, arthralgias and gait problem.  Skin: Denies pallor, rash and wound.  Neurological: Denies dizziness, seizures, syncope, weakness, light-headedness, numbness and headaches.  Hematological: Denies adenopathy. Easy bruising, personal or family bleeding history  Psychiatric/Behavioral: Denies suicidal ideation, mood changes, confusion, nervousness, sleep disturbance and agitation

## 2011-01-02 NOTE — Progress Notes (Signed)
Addended by: Angelina Ok F on: 01/02/2011 05:06 PM   Modules accepted: Orders

## 2011-01-06 ENCOUNTER — Other Ambulatory Visit: Payer: Self-pay | Admitting: Internal Medicine

## 2011-02-01 ENCOUNTER — Telehealth: Payer: Self-pay | Admitting: *Deleted

## 2011-02-01 NOTE — Telephone Encounter (Signed)
Pt would like to have the Novolin 70/30 changed to Humulin 70/30.  Humulin 70/30 is cheaper at [facility].

## 2011-02-02 MED ORDER — INSULIN NPH ISOPHANE & REGULAR (70-30) 100 UNIT/ML ~~LOC~~ SUSP: SUBCUTANEOUS | Status: DC

## 2011-02-02 NOTE — Telephone Encounter (Signed)
comment

## 2011-04-12 ENCOUNTER — Encounter: Payer: Self-pay | Admitting: Internal Medicine

## 2011-04-12 ENCOUNTER — Ambulatory Visit (INDEPENDENT_AMBULATORY_CARE_PROVIDER_SITE_OTHER): Payer: Self-pay | Admitting: Internal Medicine

## 2011-04-12 DIAGNOSIS — M545 Low back pain, unspecified: Secondary | ICD-10-CM | POA: Insufficient documentation

## 2011-04-12 DIAGNOSIS — M25559 Pain in unspecified hip: Secondary | ICD-10-CM

## 2011-04-12 DIAGNOSIS — E119 Type 2 diabetes mellitus without complications: Secondary | ICD-10-CM

## 2011-04-12 DIAGNOSIS — E785 Hyperlipidemia, unspecified: Secondary | ICD-10-CM

## 2011-04-12 DIAGNOSIS — M25551 Pain in right hip: Secondary | ICD-10-CM

## 2011-04-12 DIAGNOSIS — I10 Essential (primary) hypertension: Secondary | ICD-10-CM

## 2011-04-12 LAB — GLUCOSE, CAPILLARY: Glucose-Capillary: 161 mg/dL — ABNORMAL HIGH (ref 70–99)

## 2011-04-12 LAB — POCT GLYCOSYLATED HEMOGLOBIN (HGB A1C): Hemoglobin A1C: 8.4

## 2011-04-12 MED ORDER — INSULIN NPH ISOPHANE & REGULAR (70-30) 100 UNIT/ML ~~LOC~~ SUSP: SUBCUTANEOUS | Status: DC

## 2011-04-12 MED ORDER — PRAVASTATIN SODIUM 20 MG PO TABS: 20.0000 mg | ORAL_TABLET | Freq: Every day | ORAL | Status: DC

## 2011-04-12 MED ORDER — ASPIRIN 81 MG PO CHEW: 81.0000 mg | CHEWABLE_TABLET | Freq: Every day | ORAL | Status: DC

## 2011-04-12 MED ORDER — LISINOPRIL 10 MG PO TABS: ORAL_TABLET | ORAL | Status: DC

## 2011-04-12 NOTE — Assessment & Plan Note (Signed)
Patient has not been taking lisinopril and Norvasc since last month since he ran out of the period he thought he was not supposed to take it anymore. He restart his lisinopril at 10 mg per day. I will not start Norvasc at this time because his blood pressure seems to be well controlled.

## 2011-04-12 NOTE — Assessment & Plan Note (Signed)
Hip exam is normal. His history suggest some early arthritis which has gotten worse in cold weather. His pain is controlled with occasional ibuprofen or Tylenol. Advised physical therapy but patient states he wants to try losing weight and walking at this time. I'm agreeable to that plan. If symptoms continues to get worse may refer to physical therapy

## 2011-04-12 NOTE — Patient Instructions (Signed)
Return to the clinic for blood draw in 1-2 weeks. Come fasting at that time.  I will see you again in 3 months.

## 2011-04-12 NOTE — Assessment & Plan Note (Addendum)
HbA1c bumped at this visit from 7.2 her last visit to 8.4 today. He states he has been eating a lot in the past holidays. He has been taking his insulin regularly. We'll ask him to exercise more and controlled his diet. Will change insulin dose from 17 in the evening to 90 in the evening because the CBG readings show most of his hyperglycemic episodes have been in the evening. No episode of hypoglycemia. See him again in 3 months. Check serum chemistry. Foot exam normal.

## 2011-04-12 NOTE — Assessment & Plan Note (Addendum)
Check lipid profile.Patient has not been taking his statin for a month.. restart pravastatin at 20 mg

## 2011-04-12 NOTE — Progress Notes (Signed)
65 year old man with past medical history of hypertension, diabetes, hyperlipidemia comes to the clinic for follow up. Complains of some right hip pain. This is occasional. No pattern. Mostly when he gets up from seated position. It is a  sharp pain felt for a few seconds and then goes away. Pain radiates on the inner side of his thigh. He can bear weight on that hip and does not limp while walking. No numbness or tingling. No pain on the other hip. Has been going on for a few months but got worse last few weeks when the weather got cold.  No other complaints. Has not been taking his blood pressure and cholesterol medicine since he ran out of it a month ago. Thought he was not supposed to take it any more. Continues to take his insulin.Marland Kitchen   Physical exam  BP 135/79  Pulse 71  Temp(Src) 97.5 F (36.4 C) (Oral)  Ht 6' (1.829 m)  Wt 235 lb 4.8 oz (106.731 kg)  BMI 31.91 kg/m2  SpO2 100%  General Appearance:    Alert, cooperative, no distress, appears stated age  Back:     Symmetric, no curvature, ROM normal, no CVA tenderness  Lungs:     Clear to auscultation bilaterally, respirations unlabored  Chest wall:    No tenderness or deformity  Heart:    Regular rate and rhythm, S1 and S2 normal, no murmur, rub   or gallop  Abdomen:     Soft, non-tender, bowel sounds active all four quadrants,    no masses, no organomegaly  Extremities:   full range of motion of the right hip. Pain not reproduced at this visit. No locking. Extremities normal, atraumatic, no cyanosis or edema  Pulses:   2+ and symmetric all extremities  Skin:   Skin color, texture, turgor normal, no rashes or lesions  Neurologic:   CNII-XII intact. Normal strength, sensation and reflexes      throughout   Review of system: As per history of present illness. Constitutional: Denies fever, chills, diaphoresis, appetite change and fatigue.  HEENT: Denies photophobia, eye pain, redness, hearing loss, ear pain, congestion, sore throat,  rhinorrhea, sneezing, mouth sores, trouble swallowing, neck pain, neck stiffness and tinnitus.   Respiratory: Denies SOB, DOE, cough, chest tightness,  and wheezing.   Cardiovascular: Denies chest pain, palpitations and leg swelling.  Gastrointestinal: Denies nausea, vomiting, abdominal pain, diarrhea, constipation, blood in stool and abdominal distention.  Genitourinary: Denies dysuria, urgency, frequency, hematuria, flank pain and difficulty urinating.  Musculoskeletal: Denies myalgias, back pain, joint swelling, arthralgias and gait problem.  Skin: Denies pallor, rash and wound.  Neurological: Denies dizziness, seizures, syncope, weakness, light-headedness, numbness and headaches.  Hematological: Denies adenopathy. Easy bruising, personal or family bleeding history  Psychiatric/Behavioral: Denies suicidal ideation, mood changes, confusion, nervousness, sleep disturbance and agitation

## 2011-04-16 ENCOUNTER — Other Ambulatory Visit (INDEPENDENT_AMBULATORY_CARE_PROVIDER_SITE_OTHER): Payer: Self-pay

## 2011-04-16 DIAGNOSIS — I10 Essential (primary) hypertension: Secondary | ICD-10-CM

## 2011-04-16 DIAGNOSIS — M25551 Pain in right hip: Secondary | ICD-10-CM

## 2011-04-16 DIAGNOSIS — M25559 Pain in unspecified hip: Secondary | ICD-10-CM

## 2011-04-16 DIAGNOSIS — E785 Hyperlipidemia, unspecified: Secondary | ICD-10-CM

## 2011-04-16 DIAGNOSIS — E119 Type 2 diabetes mellitus without complications: Secondary | ICD-10-CM

## 2011-04-17 LAB — BASIC METABOLIC PANEL
BUN: 15 mg/dL (ref 6–23)
Calcium: 9.3 mg/dL (ref 8.4–10.5)
Creat: 0.76 mg/dL (ref 0.50–1.35)
Glucose, Bld: 152 mg/dL — ABNORMAL HIGH (ref 70–99)

## 2011-04-17 LAB — LIPID PANEL: Cholesterol: 238 mg/dL — ABNORMAL HIGH (ref 0–200)

## 2011-05-01 ENCOUNTER — Encounter: Payer: Self-pay | Admitting: Internal Medicine

## 2011-05-24 ENCOUNTER — Encounter: Payer: Self-pay | Admitting: Internal Medicine

## 2011-05-24 DIAGNOSIS — E119 Type 2 diabetes mellitus without complications: Secondary | ICD-10-CM

## 2011-05-24 NOTE — Assessment & Plan Note (Signed)
Error in previous A/p. Will change novolin dose to 20 (not 90) from 17

## 2011-07-04 ENCOUNTER — Ambulatory Visit (INDEPENDENT_AMBULATORY_CARE_PROVIDER_SITE_OTHER): Payer: Self-pay | Admitting: Internal Medicine

## 2011-07-04 ENCOUNTER — Encounter: Payer: Self-pay | Admitting: Internal Medicine

## 2011-07-04 VITALS — BP 144/78 | HR 74 | Temp 97.9°F | Ht 72.0 in | Wt 237.8 lb

## 2011-07-04 DIAGNOSIS — Z79899 Other long term (current) drug therapy: Secondary | ICD-10-CM

## 2011-07-04 DIAGNOSIS — E785 Hyperlipidemia, unspecified: Secondary | ICD-10-CM

## 2011-07-04 DIAGNOSIS — I10 Essential (primary) hypertension: Secondary | ICD-10-CM

## 2011-07-04 DIAGNOSIS — E119 Type 2 diabetes mellitus without complications: Secondary | ICD-10-CM

## 2011-07-04 LAB — LIPID PANEL
HDL: 45 mg/dL (ref >39)
LDL Cholesterol: 118 mg/dL — ABNORMAL HIGH (ref 0–99)
Total CHOL/HDL Ratio: 4.4 Ratio

## 2011-07-04 LAB — GLUCOSE, CAPILLARY: Glucose-Capillary: 139 mg/dL — ABNORMAL HIGH (ref 70–99)

## 2011-07-04 MED ORDER — PRAVASTATIN SODIUM 40 MG PO TABS: 40.0000 mg | ORAL_TABLET | Freq: Every day | ORAL | Status: DC

## 2011-07-04 MED ORDER — LISINOPRIL 20 MG PO TABS: 20.0000 mg | ORAL_TABLET | Freq: Every day | ORAL | Status: DC

## 2011-07-04 MED ORDER — METFORMIN HCL 500 MG PO TABS: ORAL_TABLET | ORAL | Status: DC

## 2011-07-05 NOTE — Assessment & Plan Note (Signed)
Goal less than 130 systolic. Increase lisinopril to 20 mg daily Check chemistry next visit.

## 2011-07-05 NOTE — Assessment & Plan Note (Addendum)
On his meter log, the CBGs are running in 200s to 300s. His A1c is above 8 but it's coming down. He takes his insulin regularly. Takes 20 units in the morning and 23 in the evening. He has never been on metformin. He was in acute renal failure when he was first diagnosed with diabetes and hence was not started on metformin at that time. I did start him on metformin and we titrated the dose up. Continue his insulin at current dose. For exam normal today. Refer to ophthalmology for diabetic eye exam. Continue ACE inhibitor. Advice lifestylemodification. Followup Tobey Bride

## 2011-07-05 NOTE — Progress Notes (Signed)
Patient ID: Dennis [name], male   DOB: May 14, 1946, 66 y.o.   MRN: [id number]  66 year old man with past medical history of hypertension, hyperlipidemia, diabetes comes to the clinic for followup. No new complaints today. Compliant with his medications. See assessment and plan for details on individual problems  Physical exam   General Appearance:     Filed Vitals:   07/04/11 1402  BP: 144/78  Pulse: 74  Temp: 97.9 F (36.6 C)  TempSrc: Oral  Height: 6' (1.829 m)  Weight: 237 lb 12.8 oz (107.865 kg)  SpO2: 98%     Alert, cooperative, no distress, appears stated age  Head:    Normocephalic, without obvious abnormality, atraumatic  Eyes:    PERRL, conjunctiva/corneas clear, EOM's intact, fundi    benign, both eyes       Neck:   Supple, symmetrical, trachea midline, no adenopathy;       thyroid:  No enlargement/tenderness/nodules; no carotid   bruit or JVD  Lungs:     Clear to auscultation bilaterally, respirations unlabored  Chest wall:    No tenderness or deformity  Heart:    Regular rate and rhythm, S1 and S2 normal, no murmur, rub   or gallop  Abdomen:     Soft, non-tender, bowel sounds active all four quadrants,    no masses, no organomegaly  Extremities:   Extremities normal, atraumatic, no cyanosis or edema  Pulses:   2+ and symmetric all extremities  Skin:   Skin color, texture, turgor normal, no rashes or lesions  Neurologic:  nonfocal grossly   ROS  Constitutional: Denies fever, chills, diaphoresis, appetite change and fatigue.  Respiratory: Denies SOB, DOE, cough, chest tightness,  and wheezing.   Cardiovascular: Denies chest pain, palpitations and leg swelling.  Gastrointestinal: Denies nausea, vomiting, abdominal pain, diarrhea, constipation, blood in stool and abdominal distention.  Skin: Denies pallor, rash and wound.  Neurological: Denies dizziness, light-headedness, numbness and headaches.

## 2011-07-05 NOTE — Assessment & Plan Note (Signed)
LD goal less than 100. Increase pravastatin to 40 mg po qd Check lipid profile in 3 months

## 2011-10-23 ENCOUNTER — Other Ambulatory Visit: Payer: Self-pay | Admitting: *Deleted

## 2011-10-23 DIAGNOSIS — E119 Type 2 diabetes mellitus without complications: Secondary | ICD-10-CM

## 2011-10-23 MED ORDER — LISINOPRIL 20 MG PO TABS: 20.0000 mg | ORAL_TABLET | Freq: Every day | ORAL | Status: DC

## 2011-10-23 MED ORDER — PRAVASTATIN SODIUM 40 MG PO TABS: 40.0000 mg | ORAL_TABLET | Freq: Every day | ORAL | Status: DC

## 2011-12-25 ENCOUNTER — Other Ambulatory Visit: Payer: Self-pay | Admitting: *Deleted

## 2011-12-25 DIAGNOSIS — E119 Type 2 diabetes mellitus without complications: Secondary | ICD-10-CM

## 2011-12-25 MED ORDER — PRAVASTATIN SODIUM 40 MG PO TABS: 40.0000 mg | ORAL_TABLET | Freq: Every day | ORAL | Status: DC

## 2011-12-25 MED ORDER — LISINOPRIL 20 MG PO TABS: 20.0000 mg | ORAL_TABLET | Freq: Every day | ORAL | Status: DC

## 2012-04-02 ENCOUNTER — Other Ambulatory Visit: Payer: Self-pay | Admitting: Internal Medicine

## 2012-05-01 IMAGING — CT CT ABD-PELV W/ CM
2 of 5 series · 17 of 46 positions shown, 19 images · IV contrast (APPLIED)
Comparison: Lung bases are clear.

CLINICAL DATA: Abdominal pain, pelvic pain.  Painful urination.

CT ABDOMEN AND PELVIS WITH CONTRAST
TECHNIQUE: Multidetector CT imaging of the abdomen and pelvis was
performed following the standard protocol during bolus
administration of intravenous contrast.
Contrast: 100 ml Omnipaque 300 IV.

[Series 2: abd/pelv with 5.0 b31f st · axial · 0.68mm/px · z∈[-540,-134]mm · 14 of 93 slices shown, 16 images]
[im 6/93  soft-tissue]
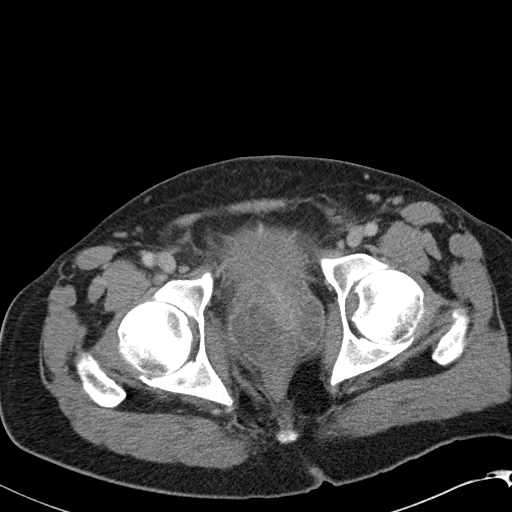
[im 6/93  bone]
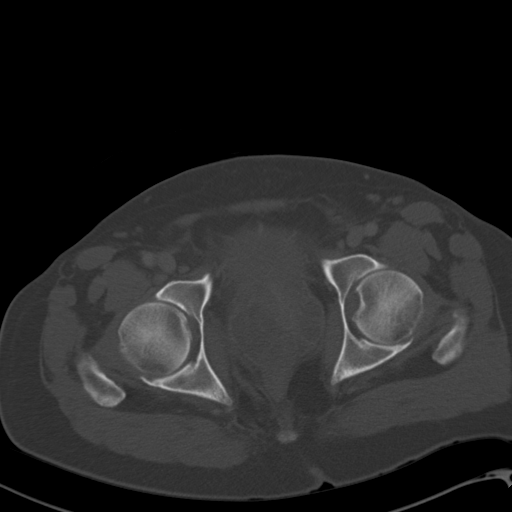
[im 11/93  soft-tissue]
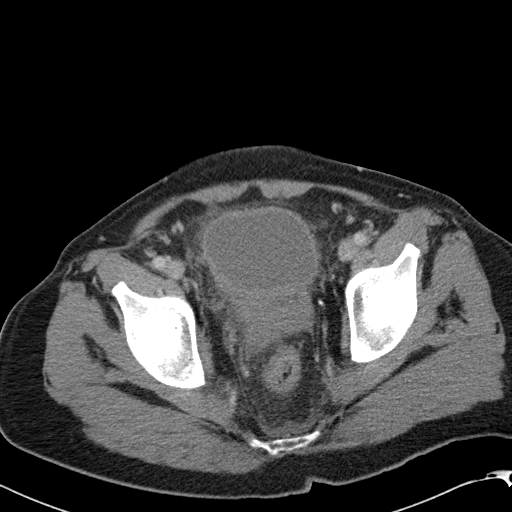
[im 21/93  soft-tissue]
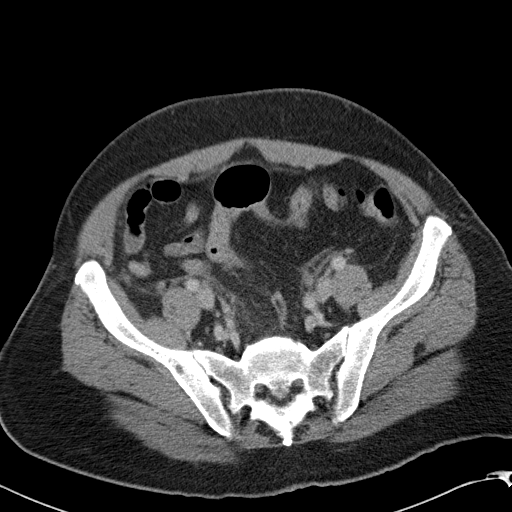
[im 26/93  soft-tissue]
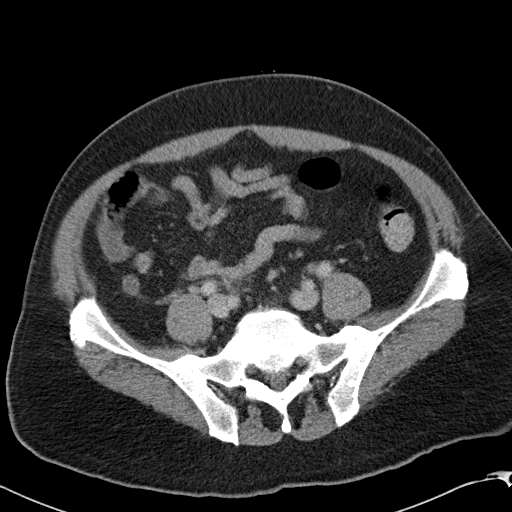
[im 31/93  soft-tissue]
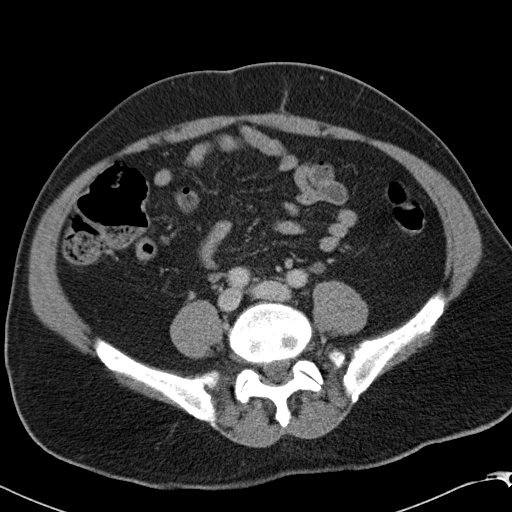
[im 36/93  soft-tissue]
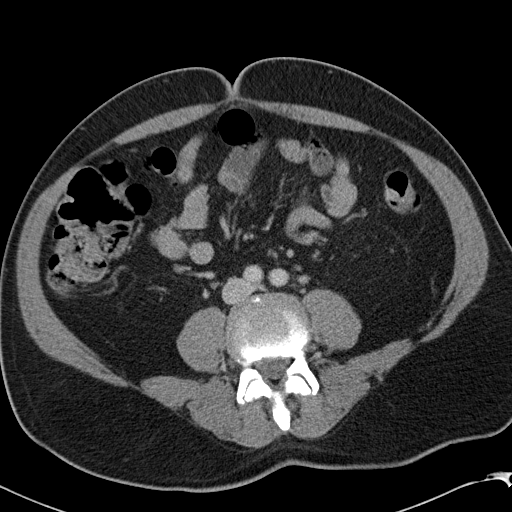
[im 41/93  soft-tissue]
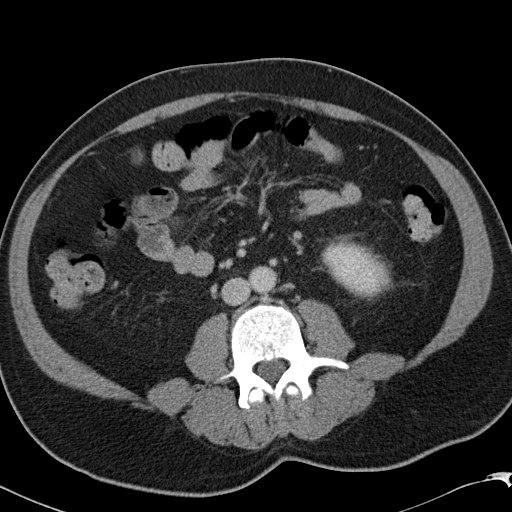
[im 52/93  soft-tissue]
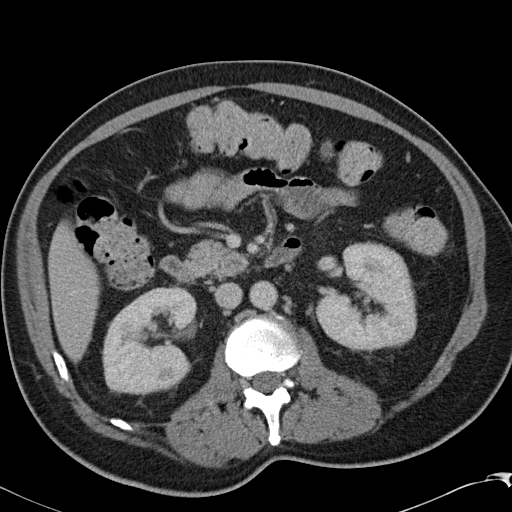
[im 57/93  soft-tissue]
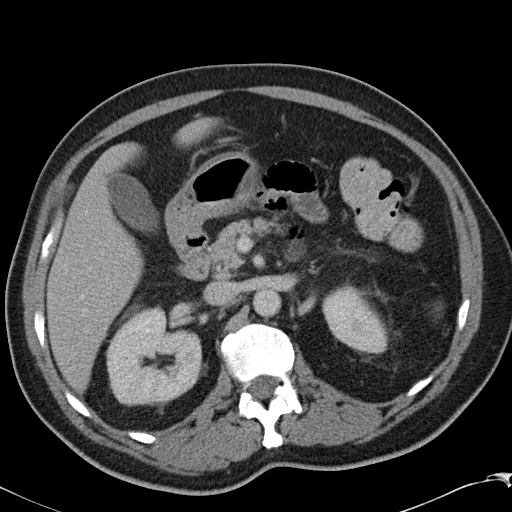
[im 57/93  bone]
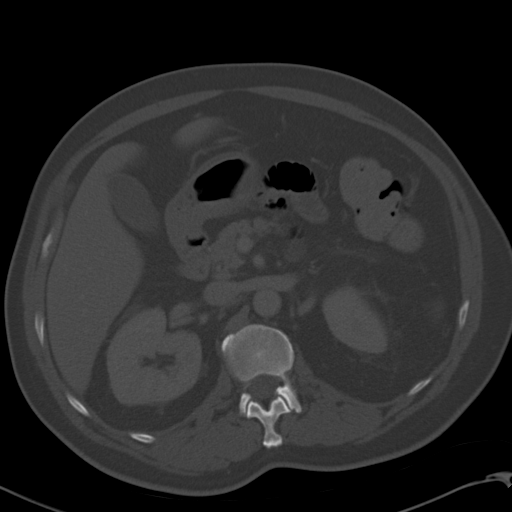
[im 62/93  soft-tissue]
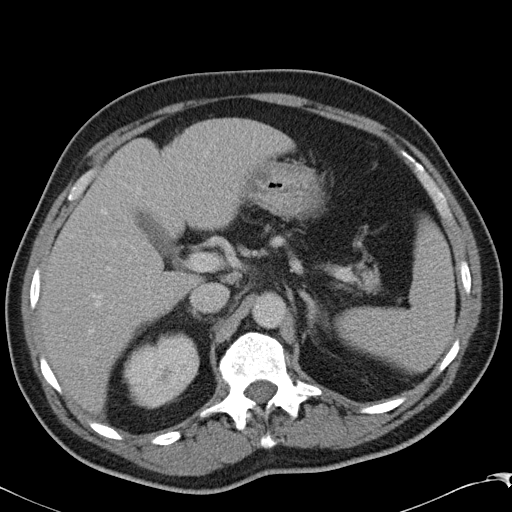
[im 67/93  soft-tissue]
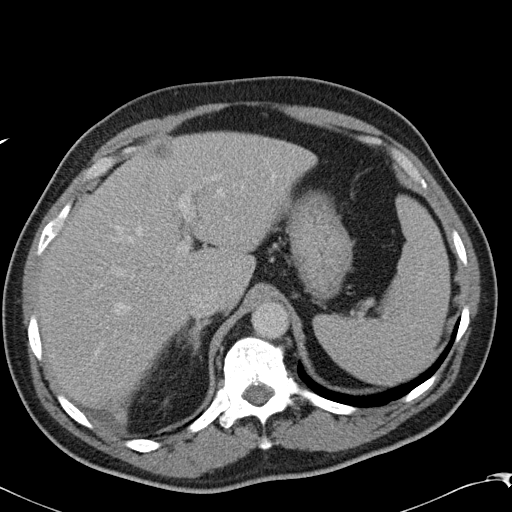
[im 72/93  soft-tissue]
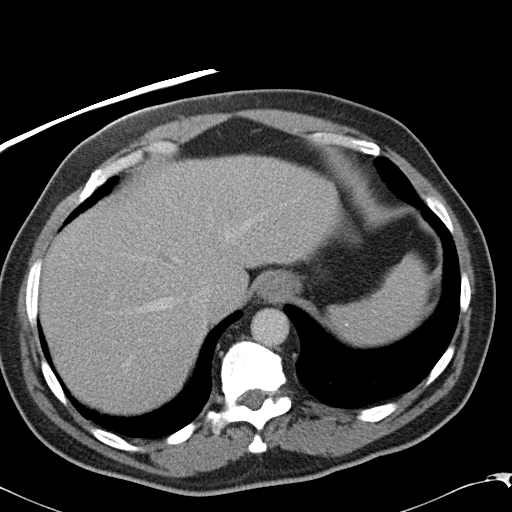
[im 82/93  soft-tissue]
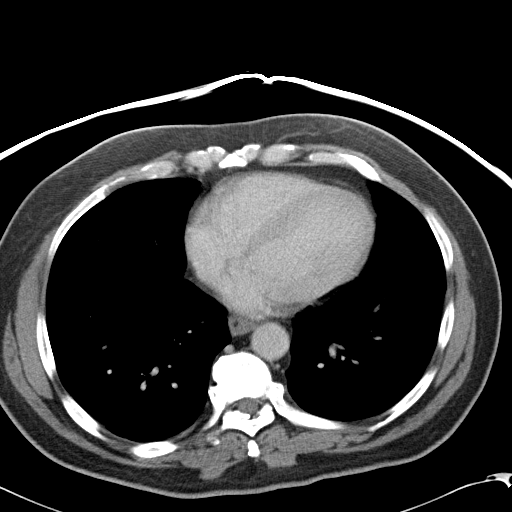
[im 87/93  soft-tissue]
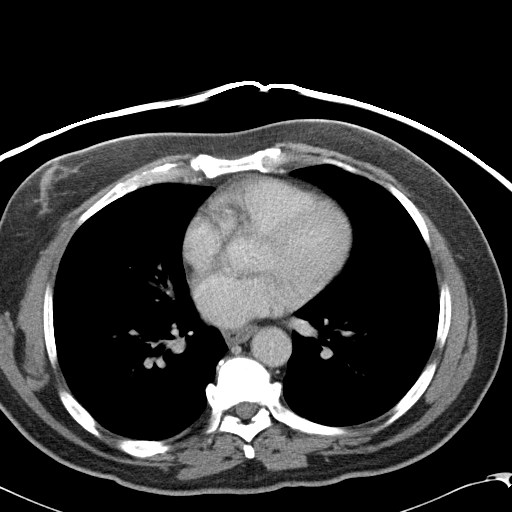

[Series 602: cor · coronal · 0.91mm/px · 3 of 141 slices shown]
[im 47/141  soft-tissue]
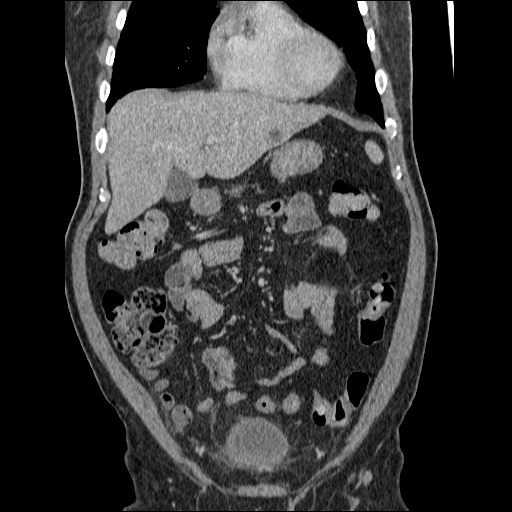
[im 63/141  soft-tissue]
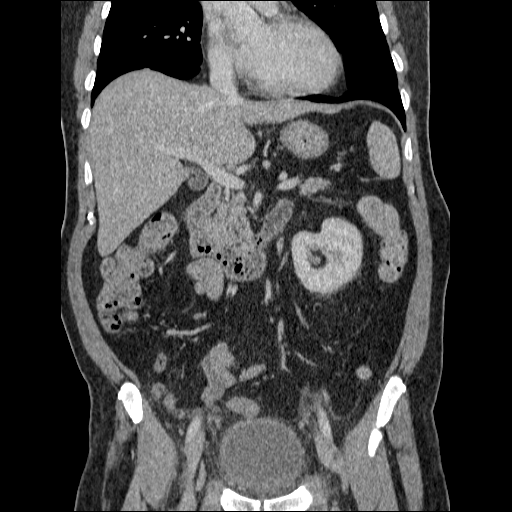
[im 78/141  soft-tissue]
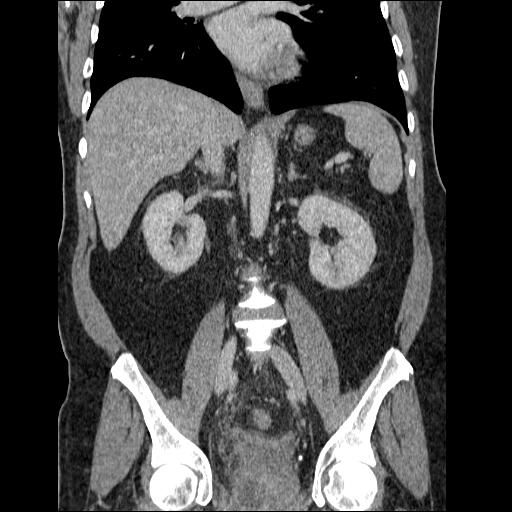

[17 of 46 positions shown; findings below may reference images not displayed]

No effusions.  Heart is normal
size.

18 mm low density lesion noted in the left hepatic lobe.  There
appears to be slight nodularity along the wall. 21 mm low density
lesion noted adjacent to the falciform ligament. This likely
represents a benign cyst.  No other focal lesions in the liver.
Gallbladder, spleen, pancreas, adrenals and kidneys are
unremarkable.

Proximal and midportions of the appendix are normal.  There is mild
bulbous configuration of the tip.  No surrounding inflammatory
changes.  Large and small bowel are unremarkable.

Extensive inflammatory changes around the bladder compatible with
cystitis.  Marked enlargement of the prostate gland.  The prostate
demonstrates areas of low density.  I cannot completely exclude
prostatitis or even early abscesses in the prostate.  Recommend
clinical correlation.

There is a small amount of free fluid adjacent to the posterior
liver edge.  No adenopathy or free air.  There are bilateral
inguinal hernias containing fat.
FINDINGS: Extensive stranding around the urinary bladder compatible
with cystitis.

Marked enlargement prostate with areas of low density throughout
the prostate.  Cannot exclude prostatitis and/or early abscesses.
Recommend clinical correlation.

Bulbous appearance of the tip of the appendix.  No surrounding
inflammatory change and the remainder of the appendix is
unremarkable.  I suspect this is within normal limits for this
patient.

Small amount of free fluid adjacent to the posterior liver.

Bilateral inguinal hernias containing fat.
IMPRESSION:

## 2012-05-22 ENCOUNTER — Encounter: Payer: Self-pay | Admitting: Internal Medicine

## 2012-11-20 ENCOUNTER — Other Ambulatory Visit: Payer: Self-pay

## 2012-12-11 ENCOUNTER — Emergency Department (HOSPITAL_COMMUNITY)
Admission: EM | Admit: 2012-12-11 | Discharge: 2012-12-11 | Disposition: A | Discharge status: Home / Self Care | Payer: Medicare Other | Attending: Emergency Medicine | Admitting: Emergency Medicine

## 2012-12-11 ENCOUNTER — Encounter (HOSPITAL_COMMUNITY): Payer: Self-pay | Admitting: *Deleted

## 2012-12-11 DIAGNOSIS — Z794 Long term (current) use of insulin: Secondary | ICD-10-CM | POA: Insufficient documentation

## 2012-12-11 DIAGNOSIS — K089 Disorder of teeth and supporting structures, unspecified: Secondary | ICD-10-CM | POA: Insufficient documentation

## 2012-12-11 DIAGNOSIS — Z8719 Personal history of other diseases of the digestive system: Secondary | ICD-10-CM | POA: Insufficient documentation

## 2012-12-11 DIAGNOSIS — K0889 Other specified disorders of teeth and supporting structures: Secondary | ICD-10-CM

## 2012-12-11 DIAGNOSIS — E119 Type 2 diabetes mellitus without complications: Secondary | ICD-10-CM | POA: Insufficient documentation

## 2012-12-11 DIAGNOSIS — I129 Hypertensive chronic kidney disease with stage 1 through stage 4 chronic kidney disease, or unspecified chronic kidney disease: Secondary | ICD-10-CM | POA: Insufficient documentation

## 2012-12-11 DIAGNOSIS — N189 Chronic kidney disease, unspecified: Secondary | ICD-10-CM | POA: Insufficient documentation

## 2012-12-11 MED ORDER — PENICILLIN V POTASSIUM 500 MG PO TABS: 500.0000 mg | ORAL_TABLET | Freq: Three times a day (TID) | ORAL | Status: DC

## 2012-12-11 MED ORDER — OXYCODONE-ACETAMINOPHEN 5-325 MG PO TABS: 1.0000 | ORAL_TABLET | Freq: Four times a day (QID) | ORAL | Status: DC | PRN

## 2012-12-11 NOTE — ED Notes (Addendum)
Reports he has a few teeth right lower molar area that are loose.  States took [facility] powder for pain at 0300 with no relief. Denies fever.

## 2012-12-11 NOTE — ED Provider Notes (Signed)
CSN: [id number]     Arrival date & time 12/11/12  4696 History     First MD Initiated Contact with Patient 12/11/12 [phone]     Chief Complaint  Patient presents with  . Dental Pain   (Consider location/radiation/quality/duration/timing/severity/associated sxs/prior Treatment) HPI Pt presents with pain in right lower molars.  He has noticed pain for the past several days.  Pain worsening over time.  He has felt his right posterior molars to be loose for quite some time.  No fever, no difficulty swallowing.  Pain when chewing.  He states his blood sugars have been under good control.  No vomiting or difficulty breathing.  There are no other associated systemic symptoms, there are no other alleviating or modifying factors.   Past Medical History  Diagnosis Date  . Hypertension   . Chronic kidney disease     acute kidney injury, in setting of gentamicin toxicity,  . Diabetes mellitus   . 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 No pertinent past surgical history. No family history on file. History  Substance Use Topics  . Smoking status: Never Smoker   . Smokeless tobacco: Not on file  . Alcohol Use: Yes     Comment: occasional    Review of Systems ROS reviewed and all otherwise negative except for mentioned in HPI  Allergies  Review of patient's allergies indicates no known allergies.  Home Medications   Current Outpatient Rx  Name  Route  Sig  Dispense  Refill  . insulin NPH-regular (NOVOLIN 70/30) (70-30) 100 UNIT/ML injection   Subcutaneous   Inject 20-23 Units into the skin See admin instructions. Takes 23 units in the morning Takes 20 units in the evening.         . naproxen sodium (ANAPROX) 220 MG tablet   Oral   Take 440 mg by mouth 2 (two) times daily as needed (for pain).         Marland Kitchen oxyCODONE-acetaminophen (PERCOCET/ROXICET) 5-325 MG per tablet   Oral   Take 1-2 tablets by mouth every 6 (six) hours as needed for pain.  15 tablet   0   . penicillin v potassium (VEETID) 500 MG tablet   Oral   Take 1 tablet (500 mg total) by mouth 3 (three) times daily.   30 tablet   0    BP 171/73  Pulse 62  Temp(Src) 99.1 F (37.3 C) (Oral)  Resp 18  Ht 6' (1.829 m)  Wt 210 lb (95.255 kg)  BMI 28.47 kg/m2  SpO2 99% Vitals reviewed Physical Exam Physical Examination: General appearance - alert, well appearing, and in no distress Mental status - alert, oriented to person, place, and time Eyes - no conjunctival injection, no scleral icterus Mouth - mucous membranes moist, pharynx normal without lesions, ttp over right posterior molar, no evidence of periapical abscess, no swelling under tongue Neck - supple, no significant adenopathy Chest - clear to auscultation, no wheezes, rales or rhonchi, symmetric air entry Heart - normal rate, regular rhythm, normal S1, S2, no murmurs, rubs, clicks or gallops Extremities - peripheral pulses normal, no pedal edema, no clubbing or cyanosis Skin - normal coloration and turgor, no rashes  ED Course   Procedures (including critical care time)  Labs Reviewed - No data to display No results found. 1. Pain, dental     MDM  Pt presenting with c/o dental pain.  Given rx for abx and pain medications.  Strongly  encouraged to arrange for dental followup.  Discharged with strict return precautions.  Pt agreeable with plan.  Ethelda Chick, MD 12/11/12 [phone]

## 2012-12-11 NOTE — ED Notes (Signed)
C/o toothache x 2 weeks & swelling to left side face x 1 week. Pt does not have a dentist

## 2015-04-25 ENCOUNTER — Encounter: Payer: Self-pay | Admitting: Internal Medicine

## 2015-04-25 ENCOUNTER — Ambulatory Visit (INDEPENDENT_AMBULATORY_CARE_PROVIDER_SITE_OTHER): Payer: Medicare Other | Admitting: Internal Medicine

## 2015-04-25 ENCOUNTER — Telehealth: Payer: Self-pay | Admitting: Licensed Clinical Social Worker

## 2015-04-25 VITALS — BP 161/63 | HR 73 | Temp 98.0°F | Ht 72.0 in | Wt 222.6 lb

## 2015-04-25 DIAGNOSIS — Z794 Long term (current) use of insulin: Secondary | ICD-10-CM | POA: Diagnosis not present

## 2015-04-25 DIAGNOSIS — Z23 Encounter for immunization: Secondary | ICD-10-CM | POA: Diagnosis not present

## 2015-04-25 DIAGNOSIS — E118 Type 2 diabetes mellitus with unspecified complications: Secondary | ICD-10-CM | POA: Diagnosis not present

## 2015-04-25 DIAGNOSIS — E669 Obesity, unspecified: Secondary | ICD-10-CM | POA: Insufficient documentation

## 2015-04-25 DIAGNOSIS — E1165 Type 2 diabetes mellitus with hyperglycemia: Secondary | ICD-10-CM

## 2015-04-25 DIAGNOSIS — M545 Low back pain, unspecified: Secondary | ICD-10-CM

## 2015-04-25 DIAGNOSIS — E785 Hyperlipidemia, unspecified: Secondary | ICD-10-CM

## 2015-04-25 DIAGNOSIS — IMO0002 Reserved for concepts with insufficient information to code with codable children: Secondary | ICD-10-CM

## 2015-04-25 DIAGNOSIS — E1140 Type 2 diabetes mellitus with diabetic neuropathy, unspecified: Secondary | ICD-10-CM | POA: Diagnosis not present

## 2015-04-25 DIAGNOSIS — Z9114 Patient's other noncompliance with medication regimen: Secondary | ICD-10-CM | POA: Diagnosis not present

## 2015-04-25 DIAGNOSIS — I10 Essential (primary) hypertension: Secondary | ICD-10-CM

## 2015-04-25 DIAGNOSIS — Z1211 Encounter for screening for malignant neoplasm of colon: Secondary | ICD-10-CM | POA: Insufficient documentation

## 2015-04-25 LAB — GLUCOSE, CAPILLARY: GLUCOSE-CAPILLARY: 214 mg/dL — AB (ref 65–99)

## 2015-04-25 LAB — POCT GLYCOSYLATED HEMOGLOBIN (HGB A1C): HEMOGLOBIN A1C: 11.8

## 2015-04-25 MED ORDER — PRAVASTATIN SODIUM 40 MG PO TABS: 40.0000 mg | ORAL_TABLET | Freq: Every evening | ORAL | Status: AC

## 2015-04-25 MED ORDER — ACETAMINOPHEN 500 MG PO TABS: 500.0000 mg | ORAL_TABLET | Freq: Two times a day (BID) | ORAL | Status: AC | PRN

## 2015-04-25 MED ORDER — LISINOPRIL 20 MG PO TABS: 20.0000 mg | ORAL_TABLET | Freq: Every day | ORAL | Status: DC

## 2015-04-25 MED ORDER — INSULIN NPH ISOPHANE & REGULAR (70-30) 100 UNIT/ML ~~LOC~~ SUSP: 25.0000 [IU] | Freq: Every day | SUBCUTANEOUS | Status: DC

## 2015-04-25 MED ORDER — NAPROXEN 500 MG PO TABS: 500.0000 mg | ORAL_TABLET | Freq: Two times a day (BID) | ORAL | Status: AC

## 2015-04-25 NOTE — Assessment & Plan Note (Addendum)
Overview He was last seen in this clinic on 07/05/2011 at which time his CBGs trended 200s to 300s [A1c 8.3]. He was taking NovoLog 70/30 20 units in the morning and 23 in the evening. This medication was last refilled by this clinic on 04/02/2012.  Today, he reports he is taking 25 units with breakfast though no mealtime dosing. He does not use a glucometer check his blood sugars and feels it may not be functioning properly. He does report polydipsia though denies polyuria along with other symptoms of hypoglycemia: Diaphoresis, nausea, vomiting, abdominal pain, dizziness. He does however report pins/needles sensation in his feet bilaterally. He would also like to be seen by an eye doctor since he feels his vision has been worsening since his last office visit.  Assessment Suspect poorly controlled type 2 diabetes in the setting of no follow-up. Foot exam today without signs of skin breakdown which is reassuring to suspect the symptoms he is reporting are consistent with neuropathy.  Plan -Check A1c today and defer changes to his insulin regimen until he can follow-up next week -Referred to Ms. Butch Penny Plyler for glucometer and teaching of how to check blood sugars  -Referred to ophthalmology for annual exam and corrective eyewear  ADDENDUM 04/25/2015  1:48 PM:  -A1c 11.8, consistent with poor control

## 2015-04-25 NOTE — Progress Notes (Signed)
   Subjective:    Patient ID: Dennis [name], male    DOB: 05-11-1946, 69 y.o.   MRN: [id number]  HPI Dennis [name] is a 69 year old male with hypertension, type 2 diabetes, hyperlipidemia who presents today for medication refill. Please see assessment & plan for status of chronic medical problems.  He was last seen in this clinic on 07/05/2011 but stopped following up with [place] because he could purchase insulin over-the-counter and thus did not think he needed to follow-up for his blood pressure and cholesterol medication since the refills ran out.  He is agreeable to receiving his flu shot today along with stool cards to assess for occult fecal blood.  Reported CBGs trending 200s to 300s and was taking NovoLog 70/30 20 units in the morning and 23 in the evening. This medication was last refilled by this clinic on 04/02/2012.  Blood pressure medications at that time included lisinopril 20 mg daily which was last refilled on 12/25/2011.  Cholesterol medication at that time included pravastatin 40 mg daily which was last refilled on 12/25/2011.  Review of Systems  Respiratory: Negative for shortness of breath.   Cardiovascular: Negative for chest pain.  Gastrointestinal: Negative for nausea, vomiting, abdominal pain and diarrhea.  Endocrine: Positive for polydipsia. Negative for polyuria.  Genitourinary: Negative for hematuria.  Musculoskeletal: Positive for back pain (R-sided, intermittent, worsens as the day progresses).  Neurological: Positive for numbness. Negative for dizziness and syncope.       Objective:   Physical Exam  Constitutional: He is oriented to person, place, and time. He appears well-developed and well-nourished. No distress.  Obese, elderly African-American male.  HENT:  Head: Normocephalic and atraumatic.  Mouth/Throat: Oropharynx is clear and moist. No oropharyngeal exudate.  Eyes: Conjunctivae and EOM are normal. Pupils are equal, round, and reactive to light. No  scleral icterus.  Neck: Normal range of motion. Neck supple. No tracheal deviation present.  Cardiovascular: Normal rate, regular rhythm, normal heart sounds and intact distal pulses.  Exam reveals no gallop and no friction rub.   No murmur heard. No carotid bruits.  Pulmonary/Chest: Effort normal and breath sounds normal. No respiratory distress. He has no wheezes. He has no rales.  Abdominal: Soft. Bowel sounds are normal. He exhibits no distension and no mass. There is no tenderness. There is no rebound and no guarding.  Neurological: He is alert and oriented to person, place, and time. No cranial nerve deficit.  Skin: Skin is warm and dry. No rash noted. He is not diaphoretic. No erythema.  Psychiatric: He has a normal mood and affect. His behavior is normal.          Assessment & Plan:

## 2015-04-25 NOTE — Assessment & Plan Note (Signed)
Overview He reports ongoing right-sided low back/hip pain which is worse when he tries to shovel outside. Pain does not radiate down his leg and is not associated with any numbness/tingling. He is looking for part-time work and finds the pain debilitating. BC powder 1-2 packets a day seemed to improve his pain though he would like to try something else.  Assessment Suspect osteoarthritis given that he denies any sciatic symptoms.  Plan -Prescribed naproxen 500 mg to be taken twice daily with meals and advised not to be combined with additional NSAIDs, including BC powders -Advised Tylenol 1000 mg for breakthrough pain as needed -Refer to social work for further discussion on employment opportunities since he expressed to the nurse he is struggling to find a job

## 2015-04-25 NOTE — Patient Instructions (Addendum)
Please bring your medicines with you each time you come to clinic.  Medicines may include prescription medications, over-the-counter medications, herbal remedies, eye drops, vitamins, or other pills.   For your diabetes, please continue taking insulin 25 units in the morning with meals. Dennis [name] will give you a call about coming back sometime later this week about using a glucometer to check your blood sugar.  For your blood pressure and cholesterol, I have refilled lisinopril 20 mg and have pravastatin 40 mg. Please take each tablet daily.  For your back pain, please take naproxen 500 mg twice daily with meals. If you have pain between your doses, you may take Tylenol 1000 mg. Do not take BC powder when you take naproxen.   Just to make sure you do not have colon cancer, please use the cards provided by the lab and bring them back in next week.     Progress Toward Treatment Goals:  No flowsheet data found.  Self Care Goals & Plans:  Self Care Goal 04/25/2015  Manage my medications take my medicines as prescribed; follow the sick day instructions if I am sick  Monitor my health check my feet daily  Eat healthy foods eat more vegetables; eat fruit for snacks and desserts; eat foods that are low in salt; drink diet soda or water instead of juice or soda  Be physically active find an activity I enjoy    No flowsheet data found.   Care Management & Community Referrals:  No flowsheet data found.

## 2015-04-25 NOTE — Telephone Encounter (Signed)
Mr. Outing was referred to CSW for vocational assistance resources.  CSW placed call to Mr. Champigny however phone number rang busy on multiple attempts.

## 2015-04-25 NOTE — Assessment & Plan Note (Addendum)
Overview His last blood pressure medication was lisinopril 20 mg daily and was last refilled 12/25/2011. Blood pressure in the office today is 161/63. He denies any chest pain, dyspnea, dizziness, abdominal pain.  Assessment Essential hypertension, poorly controlled in the setting of nonadherence to medical therapy.  Plan -Check CMET, CBC -Refilled lisinopril 20 mg daily 6 months  ADDENDUM 04/27/2015  10:34 AM:  Though CMET is notable for an elevated anion gap 21, bicarbonate 20 remains within the reference range of normal which is inconsistent with diabetic ketoacidosis. He does have mild elevations in AST and ALT which appear nonspecific in the absence of abdominal symptoms.  Hemoglobin 14.3, improved from 9.8 back in December 2011. He does have thrombocytosis with platelets 423,000 that appears changed from prior.

## 2015-04-25 NOTE — Assessment & Plan Note (Signed)
Overview He has not refilled pravastatin 40 mg daily since 12/25/2011.  Assessment Hyperlipidemia secondary to type 2 diabetes, likely poorly controlled from nonadherence to statin therapy.  Plan -Refill pravastatin 40 mg daily today -Check lipid panel to assess baseline

## 2015-04-26 ENCOUNTER — Encounter: Payer: Self-pay | Admitting: *Deleted

## 2015-04-26 LAB — MICROALBUMIN / CREATININE URINE RATIO
Creatinine, Urine: 325.6 mg/dL
MICROALB/CREAT RATIO: 17.7 mg/g creat (ref 0.0–30.0)
Microalbumin, Urine: 57.5 ug/mL

## 2015-04-26 LAB — LIPID PANEL
CHOLESTEROL TOTAL: 258 mg/dL — AB (ref 100–199)
Chol/HDL Ratio: 5.5 ratio units — ABNORMAL HIGH (ref 0.0–5.0)
HDL: 47 mg/dL (ref >39)
LDL CALC: 182 mg/dL — AB (ref 0–99)
TRIGLYCERIDES: 147 mg/dL (ref 0–149)
VLDL CHOLESTEROL CAL: 29 mg/dL (ref 5–40)

## 2015-04-27 LAB — CMP14 + ANION GAP
ALK PHOS: 78 IU/L (ref 39–117)
ALT: 67 IU/L — AB (ref 0–44)
ANION GAP: 21 mmol/L — AB (ref 10.0–18.0)
AST: 43 IU/L — AB (ref 0–40)
Albumin/Globulin Ratio: 1.3 (ref 1.1–2.5)
Albumin: 4.4 g/dL (ref 3.6–4.8)
BUN/Creatinine Ratio: 22 (ref 10–22)
BUN: 17 mg/dL (ref 8–27)
Bilirubin Total: 0.3 mg/dL (ref 0.0–1.2)
CALCIUM: 10 mg/dL (ref 8.6–10.2)
CO2: 20 mmol/L (ref 18–29)
CREATININE: 0.78 mg/dL (ref 0.76–1.27)
Chloride: 103 mmol/L (ref 96–106)
GFR calc Af Amer: 107 mL/min/{1.73_m2} (ref >59)
GFR, EST NON AFRICAN AMERICAN: 93 mL/min/{1.73_m2} (ref >59)
GLOBULIN, TOTAL: 3.5 g/dL (ref 1.5–4.5)
GLUCOSE: 206 mg/dL — AB (ref 65–99)
Potassium: 4.7 mmol/L (ref 3.5–5.2)
SODIUM: 144 mmol/L (ref 134–144)
Total Protein: 7.9 g/dL (ref 6.0–8.5)

## 2015-04-27 LAB — CBC WITH DIFFERENTIAL/PLATELET
BASOS ABS: 0 10*3/uL (ref 0.0–0.2)
Basos: 1 %
EOS (ABSOLUTE): 0.2 10*3/uL (ref 0.0–0.4)
EOS: 3 %
HEMOGLOBIN: 14.3 g/dL (ref 12.6–17.7)
Hematocrit: 41.8 % (ref 37.5–51.0)
IMMATURE GRANS (ABS): 0 10*3/uL (ref 0.0–0.1)
Immature Granulocytes: 0 %
LYMPHS: 29 %
Lymphocytes Absolute: 2.1 10*3/uL (ref 0.7–3.1)
MCH: 28.8 pg (ref 26.6–33.0)
MCHC: 34.2 g/dL (ref 31.5–35.7)
MCV: 84 fL (ref 79–97)
MONOCYTES: 5 %
Monocytes Absolute: 0.4 10*3/uL (ref 0.1–0.9)
Neutrophils Absolute: 4.4 10*3/uL (ref 1.4–7.0)
Neutrophils: 62 %
Platelets: 423 10*3/uL — ABNORMAL HIGH (ref 150–379)
RBC: 4.97 x10E6/uL (ref 4.14–5.80)
RDW: 14.7 % (ref 12.3–15.4)
WBC: 7 10*3/uL (ref 3.4–10.8)

## 2015-04-27 NOTE — Telephone Encounter (Signed)
CSW attempted to contact [name].  Pt's phone number may have an error, as it either rings busy or is unable to connect.  CSW will sign off and mail letter with information.

## 2015-04-27 NOTE — Addendum Note (Signed)
Addended by: Riccardo Dubin on: 04/27/2015 10:35 AM   Modules accepted: Miquel Dunn

## 2015-04-28 NOTE — Progress Notes (Signed)
Internal Medicine Clinic Attending  Case discussed with Dr. Patel,Rushil soon after the resident saw the patient.  We reviewed the resident's history and exam and pertinent patient test results.  I agree with the assessment, diagnosis, and plan of care documented in the resident's note. 

## 2015-05-12 ENCOUNTER — Encounter: Payer: Self-pay | Admitting: Dietician

## 2015-05-12 ENCOUNTER — Encounter: Payer: Self-pay | Admitting: Internal Medicine

## 2015-05-12 ENCOUNTER — Ambulatory Visit (INDEPENDENT_AMBULATORY_CARE_PROVIDER_SITE_OTHER): Payer: Medicare Other | Admitting: Internal Medicine

## 2015-05-12 ENCOUNTER — Ambulatory Visit (INDEPENDENT_AMBULATORY_CARE_PROVIDER_SITE_OTHER): Payer: Medicare Other | Admitting: Dietician

## 2015-05-12 VITALS — BP 160/66 | HR 66 | Temp 98.7°F | Ht 72.0 in | Wt 223.5 lb

## 2015-05-12 DIAGNOSIS — E785 Hyperlipidemia, unspecified: Secondary | ICD-10-CM | POA: Diagnosis not present

## 2015-05-12 DIAGNOSIS — E1140 Type 2 diabetes mellitus with diabetic neuropathy, unspecified: Secondary | ICD-10-CM

## 2015-05-12 DIAGNOSIS — R748 Abnormal levels of other serum enzymes: Secondary | ICD-10-CM

## 2015-05-12 DIAGNOSIS — Z794 Long term (current) use of insulin: Secondary | ICD-10-CM | POA: Diagnosis not present

## 2015-05-12 DIAGNOSIS — Z713 Dietary counseling and surveillance: Secondary | ICD-10-CM

## 2015-05-12 DIAGNOSIS — E1165 Type 2 diabetes mellitus with hyperglycemia: Secondary | ICD-10-CM | POA: Diagnosis not present

## 2015-05-12 DIAGNOSIS — R740 Nonspecific elevation of levels of transaminase and lactic acid dehydrogenase [LDH]: Secondary | ICD-10-CM

## 2015-05-12 DIAGNOSIS — I10 Essential (primary) hypertension: Secondary | ICD-10-CM

## 2015-05-12 DIAGNOSIS — Z87891 Personal history of nicotine dependence: Secondary | ICD-10-CM

## 2015-05-12 DIAGNOSIS — D473 Essential (hemorrhagic) thrombocythemia: Secondary | ICD-10-CM | POA: Insufficient documentation

## 2015-05-12 DIAGNOSIS — Z0000 Encounter for general adult medical examination without abnormal findings: Secondary | ICD-10-CM

## 2015-05-12 DIAGNOSIS — D75839 Thrombocytosis, unspecified: Secondary | ICD-10-CM | POA: Insufficient documentation

## 2015-05-12 DIAGNOSIS — IMO0002 Reserved for concepts with insufficient information to code with codable children: Secondary | ICD-10-CM

## 2015-05-12 DIAGNOSIS — R768 Other specified abnormal immunological findings in serum: Secondary | ICD-10-CM | POA: Insufficient documentation

## 2015-05-12 LAB — GLUCOSE, CAPILLARY: GLUCOSE-CAPILLARY: 231 mg/dL — AB (ref 65–99)

## 2015-05-12 MED ORDER — INSULIN NPH ISOPHANE & REGULAR (70-30) 100 UNIT/ML ~~LOC~~ SUSP: 25.0000 [IU] | Freq: Two times a day (BID) | SUBCUTANEOUS | Status: DC

## 2015-05-12 NOTE — Assessment & Plan Note (Addendum)
He has not been taking pravastatin because he states he didn't have a prescription but has been sent to the pharmacy.  -encouraged to take pravastatin

## 2015-05-12 NOTE — Patient Instructions (Signed)
Dear Mr. Dame,   Please begin checking your blood sugar before meal and bedtime when you get strips for your meter.   Bring your meter to your visits.     Where to Give Insulin Injections People with type 1 diabetes must take insulin since their bodies do not make it. People with type 2 diabetes may require insulin. There are many different types of insulin as well as other injectable diabetes medicines that are meant to be injected into the fat layer under your skin. The type of insulin or injectable diabetes medicine you take may determine how many injections you give yourself and when to take the injections.   CHOOSING A SITE FOR INJECTION Insulin absorption varies from site to site. As with any injectable medication it is best for the insulin to be injected within the same body region. However, do not inject the insulin in the same spot each time. Rotating the spots you give your injections will prevent inflammation or tissue breakdown. There are four main regions that can be used for injections. The regions include the:  Abdomen (preferred region, especially for non-insulin injectable diabetes medicine).  Front and upper outer sides of thighs.  Back of upper arm.  Buttocks.

## 2015-05-12 NOTE — Patient Instructions (Addendum)
Thank you for your visit today.   Please return to the internal medicine clinic in 1 month(s) or sooner if needed.     I have made the following additions/changes to your medications:  Increase your insulin to 25 units twice daily before breakfast and before dinner.   Please be aware of the symptoms of low blood sugar.   Please check your blood sugar 3 times daily before breakfast, lunch, and dinner and at bedtime.   Please be sure to take your lisinopril (blood pressure medication) daily.   Please be sure to bring all of your medications with you to every visit; this includes herbal supplements, vitamins, eye drops, and any over-the-counter medications.   Should you have any questions regarding your medications and/or any new or worsening symptoms, please be sure to call the clinic at [phone].   If you believe that you are suffering from a life threatening condition or one that may result in the loss of limb or function, then you should call 911 and proceed to the nearest Emergency Department.   A healthy lifestyle and preventative care can promote health and wellness.   Maintain regular health, dental, and eye exams.  Eat a healthy diet. Foods like vegetables, fruits, whole grains, low-fat dairy products, and lean protein foods contain the nutrients you need without too many calories. Decrease your intake of foods high in solid fats, added sugars, and salt. Get information about a proper diet from your caregiver, if necessary.  Regular physical exercise is one of the most important things you can do for your health. Most adults should get at least 150 minutes of moderate-intensity exercise (any activity that increases your heart rate and causes you to sweat) each week. In addition, most adults need muscle-strengthening exercises on 2 or more days a week.   Maintain a healthy weight. The body mass index (BMI) is a screening tool to identify possible weight problems. It provides an  estimate of body fat based on height and weight. Your caregiver can help determine your BMI, and can help you achieve or maintain a healthy weight. For adults 20 years and older:  A BMI below 18.5 is considered underweight.  A BMI of 18.5 to 24.9 is normal.  A BMI of 25 to 29.9 is considered overweight.  A BMI of 30 and above is considered obese. Hypoglycemia Hypoglycemia occurs when the glucose in your blood is too low. Glucose is a type of sugar that is your body's main energy source. Hormones, such as insulin and glucagon, control the level of glucose in the blood. Insulin lowers blood glucose and glucagon increases blood glucose. Having too much insulin in your blood stream, or not eating enough food containing sugar, can result in hypoglycemia. Hypoglycemia can happen to people with or without diabetes. It can develop quickly and can be a medical emergency.  CAUSES   Missing or delaying meals.  Not eating enough carbohydrates at meals.  Taking too much diabetes medicine.  Not timing your oral diabetes medicine or insulin doses with meals, snacks, and exercise.  Nausea and vomiting.  Certain medicines.  Severe illnesses, such as hepatitis, kidney disorders, and certain eating disorders.  Increased activity or exercise without eating something extra or adjusting medicines.  Drinking too much alcohol.  A nerve disorder that affects body functions like your heart rate, blood pressure, and digestion (autonomic neuropathy).  A condition where the stomach muscles do not function properly (gastroparesis). Therefore, medicines and food may not absorb properly.  Rarely, a tumor of the pancreas can produce too much insulin. SYMPTOMS   Hunger.  Sweating (diaphoresis).  Change in body temperature.  Shakiness.  Headache.  Anxiety.  Lightheadedness.  Irritability.  Difficulty concentrating.  Dry mouth.  Tingling or numbness in the hands or feet.  Restless sleep or  sleep disturbances.  Altered speech and coordination.  Change in mental status.  Seizures or prolonged convulsions.  Combativeness.  Drowsiness (lethargic).  Weakness.  Increased heart rate or palpitations.  Confusion.  Pale, gray skin color.  Blurred or double vision.  Fainting. DIAGNOSIS  A physical exam and medical history will be performed. Your caregiver may make a diagnosis based on your symptoms. Blood tests and other lab tests may be performed to confirm a diagnosis. Once the diagnosis is made, your caregiver will see if your signs and symptoms go away once your blood glucose is raised.  TREATMENT  Usually, you can easily treat your hypoglycemia when you notice symptoms.  Check your blood glucose. If it is less than 70 mg/dl, take one of the following:   3-4 glucose tablets.    cup juice.    cup regular soda.   1 cup skim milk.   -1 tube of glucose gel.   5-6 hard candies.   Avoid high-fat drinks or food that may delay a rise in blood glucose levels.  Do not take more than the recommended amount of sugary foods, drinks, gel, or tablets. Doing so will cause your blood glucose to go too high.   Wait 10-15 minutes and recheck your blood glucose. If it is still less than 70 mg/dl or below your target range, repeat treatment.   Eat a snack if it is more than 1 hour until your next meal.  There may be a time when your blood glucose may go so low that you are unable to treat yourself at home when you start to notice symptoms. You may need someone to help you. You may even faint or be unable to swallow. If you cannot treat yourself, someone will need to bring you to the hospital.  [place]  If you have diabetes, follow your diabetes management plan by:  Taking your medicines as directed.  Following your exercise plan.  Following your meal plan. Do not skip meals. Eat on time.  Testing your blood glucose regularly. Check your blood  glucose before and after exercise. If you exercise longer or different than usual, be sure to check blood glucose more frequently.  Wearing your medical alert jewelry that says you have diabetes.  Identify the cause of your hypoglycemia. Then, develop ways to prevent the recurrence of hypoglycemia.  Do not take a hot bath or shower right after an insulin shot.  Always carry treatment with you. Glucose tablets are the easiest to carry.  If you are going to drink alcohol, drink it only with meals.  Tell friends or family members ways to keep you safe during a seizure. This may include removing hard or sharp objects from the area or turning you on your side.  Maintain a healthy weight. SEEK MEDICAL CARE IF:   You are having problems keeping your blood glucose in your target range.  You are having frequent episodes of hypoglycemia.  You feel you might be having side effects from your medicines.  You are not sure why your blood glucose is dropping so low.  You notice a change in vision or a new problem with your vision. SEEK IMMEDIATE  MEDICAL CARE IF:   Confusion develops.  A change in mental status occurs.  The inability to swallow develops.  Fainting occurs.   This information is not intended to replace advice given to you by your health care provider. Make sure you discuss any questions you have with your health care provider.   Document Released: 04/30/2005 Document Revised: 05/05/2013 Document Reviewed: 01/04/2015 Elsevier Interactive Patient Education [employer].

## 2015-05-12 NOTE — Assessment & Plan Note (Addendum)
Platelets elevated on last cbc.  Unclear etiology as other values normal on cbc.   -will need repeat cbc and peripheral smear

## 2015-05-12 NOTE — Assessment & Plan Note (Addendum)
He recently re-established care after being lost to f/u.  HA1c on last OV was 11.8.  He was only taking Novolin 70/30 25 units with breakfast.  Fasting cbg today was 231.  Denies any symptoms of hyper or hypoglycemia.  He was previously on bid dosing but decided to go to once daily dosing after being lost to f/u.  -increase novolin 70/30 to 25 units bid with breakfast and dinner -has appt with Butch Penny Plyler today  -will need meter and supplies, reminded he will need to check his cbg three times daily before meals and at bedtime  -RTC in 1 month for f/u -given warning s/s of hypoglycemia  -needs diabetic education

## 2015-05-12 NOTE — Progress Notes (Signed)
Case discussed with Dr. Gordy Levan soon after the resident saw the patient.  We reviewed the resident's history and exam and pertinent patient test results.  I agree with the assessment, diagnosis, and plan of care documented in the resident's note.  Now that he has reestablished care and we have made initial adjustments in his regimen we will also address diabetic and general health care maintenance that is due when he is seen in follow-up.

## 2015-05-12 NOTE — Assessment & Plan Note (Addendum)
-  will need referral to colonoscopy  -will also need pneumonia vaccine  -new appt given for eye exam today

## 2015-05-12 NOTE — Assessment & Plan Note (Signed)
Pt with mildly elevated transaminases.  Abd [place] in 2011 showed There is a small nonspecific hypoechoic lesion in the left lobe measuring 1.8 x 1.5 x 1.6 cm.  He does endorse drinking alcohol on occasion.  Etiology likely NAFLD, however, I did not see any hepatitis panel completed and will need one based on age regardless.  Also recently started on a statin so will need to monitor. -hepatitis panel at next OV  -monitor transaminases

## 2015-05-12 NOTE — Progress Notes (Signed)
Patient ID: Dennis [name], male   DOB: [birth year], 69 y.o.MRN: [id number]      Subjective:   Patient ID: Dennis [name] male    DOB: Jun 23, 1945 69 y.o.    MRN: [id number] Health Maintenance Due: Health Maintenance Due  Topic Date Due  . Hepatitis C Screening  Feb 28, 1946  . OPHTHALMOLOGY EXAM  02/13/1957  . TETANUS/TDAP  02/13/1966  . COLONOSCOPY  02/13/1997  . ZOSTAVAX  02/14/2007  . PNA vac Low Risk Adult (1 of 2 - PCV13) 02/14/2012  . FOOT EXAM  07/03/2012    _________________________________________________  HPI: Dennis [name] is a 69 y.o. male here for a routine f/u visit for uncontrolled DM and HTN.  Pt has a PMH outlined below.  Please see problem-based charting assessment and plan for further status of patient's chronic medical problems addressed at today's visit.  PMH: Past Medical History  Diagnosis Date  . Hypertension   . Chronic kidney disease     acute kidney injury, in setting of gentamicin toxicity,  . Diabetes mellitus   . 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 acetaminophen (TYLENOL) 500 MG tablet Take 1 tablet (500 mg total) by mouth 2 (two) times daily as needed. 60 tablet 5  . lisinopril (PRINIVIL,ZESTRIL) 20 MG tablet Take 1 tablet (20 mg total) by mouth daily. 30 tablet 5  . naproxen (NAPROSYN) 500 MG tablet Take 1 tablet (500 mg total) by mouth 2 (two) times daily with a meal. 60 tablet 2  . pravastatin (PRAVACHOL) 40 MG tablet Take 1 tablet (40 mg total) by mouth every evening. 30 tablet 5   No current facility-administered medications on file prior to visit.    Allergies: No Known Allergies  FH: No family history on file.  SH: Social History   Social History  . Marital Status: Married    Spouse Name: N/A  . Number of Children: N/A  . Years of Education: N/A   Social History Main Topics  . Smoking  status: Former Research scientist (life sciences)  . Smokeless tobacco: None  . Alcohol Use: 0.0 oz/week    0 Standard drinks or equivalent per week     Comment: occasional  . Drug Use: No  . Sexual Activity: Not Asked   Other Topics Concern  . None   Social History Narrative    Review of Systems: Constitutional: Negative for fever, chills and weight loss.  Eyes: Negative for blurred vision.  Respiratory: Negative for cough and shortness of breath.  Cardiovascular: Negative for chest pain, palpitations and leg swelling.  Gastrointestinal: Negative for nausea, vomiting, abdominal pain, diarrhea, constipation and blood in stool.  Genitourinary: Negative for dysuria, urgency and frequency.  Musculoskeletal: Negative for myalgias and back pain.  Neurological: Negative for dizziness, weakness and headaches.     Objective:   Vital Signs: Filed Vitals:   05/12/15 0954  BP: 160/66  Pulse: 66  Temp: 98.7 F (37.1 C)  TempSrc: Oral  Height: 6' (1.829 m)  Weight: 223 lb 8 oz (101.379 kg)  SpO2: 100%     BP Readings from Last 3 Encounters:  05/12/15 160/66  04/25/15 161/63  12/11/12 171/73    Physical Exam: Constitutional: Vital signs reviewed.  Patient is in NAD and cooperative with exam.  Head: Normocephalic and atraumatic. Eyes: EOMI, conjunctivae nl, no scleral icterus.  Neck: Supple. Cardiovascular: RRR, no MRG.  Pulmonary/Chest: normal effort, CTAB, no wheezes, rales, or rhonchi. Abdominal: Soft. Obese. NT/ND +BS. Neurological: A&O x3, cranial nerves II-XII are grossly intact, moving all extremities. Extremities: 2+DP b/l; no pitting edema. Skin: Warm, dry and intact. No rash.   Assessment & Plan:   Assessment and plan was discussed and formulated with my attending.

## 2015-05-12 NOTE — Assessment & Plan Note (Signed)
BP elevated at 160/66 today.  He was prescribed lisinopril at his last OV but has not been taking it.   -encouraged use of lisinopril

## 2015-05-12 NOTE — Progress Notes (Signed)
Diabetes Self-Management Education  Visit Type: First/Initial Patient started DSME in 2012 and did not finish Appt. Start Time: 11 AM Appt. End Time: 12 PM  05/12/2015  [name], identified by name and date of birth, is a 69 y.o. male with a diagnosis of Diabetes: Type 2.   ASSESSMENT  There were no vitals taken for this visit. There is no weight on file to calculate BMI.      Diabetes Self-Management Education - 05/12/15 1200    Visit Information   Visit Type First/Initial   Initial Visit   Diabetes Type Type 2   Are you currently following a meal plan? No   Are you taking your medications as prescribed? Yes   Date Diagnosed --  2012   Health Coping   How would you rate your overall health? Good   Psychosocial Assessment   Patient Belief/Attitude about Diabetes Motivated to manage diabetes   Self-care barriers Lack of material resources   Self-management support Doctor's office;Family;CDE visits   Patient Concerns Nutrition/Meal planning;Monitoring   Special Needs None   Preferred Learning Style No preference indicated   Learning Readiness Ready   Complications   Last HgB A1C per patient/outside source --  11.8   How often do you check your blood sugar? 0 times/day (not testing)   Fasting Blood glucose range (mg/dL) >200   Postprandial Blood glucose range (mg/dL) >200   Number of hypoglycemic episodes per month 0   Number of hyperglycemic episodes per week 100   Can you tell when your blood sugar is high? No   Have you had a dilated eye exam in the past 12 months? No   Have you had a dental exam in the past 12 months? No   Are you checking your feet? No   Dietary Intake   Breakfast 7am egg, sausage, oatmeal or rice   Lunch cake, diet soda or crackers   Dinner 4-6 tv dinner or instant pasta or soup, baked fish, diet soda   Snack (evening) diet soda, popciorn or fruited gelatin   Beverage(s) diet soda, reguilar soda on occassion, 2 beer/week   Exercise   Exercise Type ADL's;Light (walking / raking leaves)   Patient Education   Previous Diabetes Education Yes (please comment)   Nutrition management  Role of diet in the treatment of diabetes and the relationship between the three main macronutrients and blood glucose level   Physical activity and exercise  Role of exercise on diabetes management, blood pressure control and cardiac health.   Medications Taught/reviewed insulin injection, site rotation, insulin storage and needle disposal.;Reviewed patients medication for diabetes, action, purpose, timing of dose and side effects.   Monitoring Purpose and frequency of SMBG.   Acute complications Taught treatment of hypoglycemia - the 15 rule.   Individualized Goals (developed by patient)   Monitoring  test my blood glucose as discussed   Outcomes   Expected Outcomes Demonstrated interest in learning. Expect positive outcomes   Future DMSE 2 wks   Program Status Re-entered      Individualized Plan for Diabetes Self-Management Training:   Learning Objective:  Patient will have a greater understanding of diabetes self-management.  Patient individualized diabetes plan discussed today with patient and includes:   Nutrition, done today  medications, done today monitoring, done today physical activity, done today how to handle highs and lows, how to handle lows done today  Next visit :  How to handle high blood sugars Special care for my body  when I have diabetes,  Dealing daily with diabetes diabetes support plan   Plan:    he agreed to gets strips for his meter and start checking again before meals when he starts insulin twice daily. He also agreed to bring his meter with him to his visit.   Expected Outcomes:  Demonstrated interest in learning. Expect positive outcomes Education material provided: My Plate and Snack sheet If problems or questions, patient to contact team via:  Phone Future DSME appointment: 2 wks

## 2015-05-18 NOTE — Addendum Note (Signed)
Addended by: Hulan Fray on: 05/18/2015 06:28 PM   Modules accepted: Orders

## 2015-05-20 DIAGNOSIS — H2513 Age-related nuclear cataract, bilateral: Secondary | ICD-10-CM | POA: Diagnosis not present

## 2015-05-20 DIAGNOSIS — Z794 Long term (current) use of insulin: Secondary | ICD-10-CM | POA: Diagnosis not present

## 2015-05-20 DIAGNOSIS — E119 Type 2 diabetes mellitus without complications: Secondary | ICD-10-CM | POA: Diagnosis not present

## 2015-05-20 LAB — HM DIABETES EYE EXAM

## 2015-05-23 ENCOUNTER — Encounter: Payer: Medicare Other | Admitting: Dietician

## 2015-05-23 ENCOUNTER — Telehealth: Payer: Self-pay | Admitting: Dietician

## 2015-05-26 NOTE — Telephone Encounter (Signed)
Patient rescheduled for Tuesday 05-31-15 and agrees to bring the eye exam report her has. Says his blood sugars are finally getting into range.

## 2015-05-31 ENCOUNTER — Ambulatory Visit (INDEPENDENT_AMBULATORY_CARE_PROVIDER_SITE_OTHER): Payer: Medicare Other | Admitting: Dietician

## 2015-05-31 ENCOUNTER — Encounter: Payer: Self-pay | Admitting: Dietician

## 2015-05-31 ENCOUNTER — Other Ambulatory Visit: Payer: Self-pay | Admitting: Dietician

## 2015-05-31 VITALS — Wt 224.7 lb

## 2015-05-31 DIAGNOSIS — Z713 Dietary counseling and surveillance: Secondary | ICD-10-CM

## 2015-05-31 DIAGNOSIS — E1165 Type 2 diabetes mellitus with hyperglycemia: Secondary | ICD-10-CM

## 2015-05-31 DIAGNOSIS — IMO0002 Reserved for concepts with insufficient information to code with codable children: Secondary | ICD-10-CM

## 2015-05-31 DIAGNOSIS — E1140 Type 2 diabetes mellitus with diabetic neuropathy, unspecified: Secondary | ICD-10-CM

## 2015-05-31 DIAGNOSIS — Z794 Long term (current) use of insulin: Secondary | ICD-10-CM | POA: Diagnosis not present

## 2015-05-31 DIAGNOSIS — I10 Essential (primary) hypertension: Secondary | ICD-10-CM

## 2015-05-31 NOTE — Progress Notes (Signed)
Needs rxs for syringes, lancets, strips to check blood sugar twice a day, lisinopril, lancing device   Medical Nutrition Therapy:  Appt start time: 1030 end time:  1200. Visit # 1 He will do MNT and DSME this year  Assessment:  Primary concerns today: blood sugars control.  Patient is trying very hard to care for his diabetes by sticking to times for meals, taking insulin and checking blood sugar. He says he used to cook but felt it was "all the wrong things- fired foods, pigs feet and chicken with canned mushroom soup on it. We discussed how to make these things in healthier ways and he was encouraged to cook more.  Preferred Learning Style: Auditory  Learning Readiness: Ready and  Change in progress  ANTHROPOMETRICS: weight- 224.7#, height-72", BMI-30- encourage weight loss WEIGHT HISTORY:gained 30# after losing weight before diabetes diagnosis, stays between 199 and 230# SLEEP:bedtime 01-1229, wakes 6am, naps nightly before bed MEDICATIONS: has not gotten lisinopril, taking insulin as directed BLOOD SUGAR:meter downloaded, average is 139 for 6 days . 111 is average of fasting CBgs, 166 is average of predinner CBGsreports no highs or lows or symptoms of either DIETARY INTAKE: Usual eating pattern includes 3 meals and 1 snacks per day. Everyday foods include milk, crackers, eggs.  Avoided foods include- none discussed today   24-hr recall:  B ( 6-7 AM): cereal and 2% milk, or bacon, eggs and grits or toast L he calls this a snack (12-2PM): has been eating either a banana or 8 pack peanut butter crackers D ( PM): tv dinner or instant chicken and dumplings or spaghetti, ir wife may cook meat, vegetables, starch,or salad and hard boiled egg Beverages: 2% milk- he stopped because of his blood sugars, tea he stopped because he was drinking sweet tea  Usual physical activity: walks- but amount not specified.   Estimated energy needs for weight loss ~2000 -2200 calories 230 -245 g  carbohydrates   Progress Towards Goal(s):  Some progress.   Nutritional Diagnosis:  NB-1.1 Food and nutrition-related knowledge deficit As related to lack of sufficient diabetes training.  As evidenced by his report and questions.    Intervention:  Nutrition education about diabetes meal planning, A1C, interpretation of blood sugars. Coordination of care: pt requests rxs and also needs a referral for 2017 Teaching Method Utilized: Visual,  Auditory Handouts given during visit include: AVS Barriers to learning/adherence to lifestyle change: he says he does not have Medicare D- so no drug coverage Demonstrated degree of understanding via:  Teach Back   Monitoring/Evaluation:  Dietary intake, exercise, meter, and body weight in 4 week(s).

## 2015-05-31 NOTE — Telephone Encounter (Signed)
Needs rxs for syringes, lancets, strips to check blood sugar twice a day, lisinopril, lancing device

## 2015-05-31 NOTE — Patient Instructions (Signed)
Dennis [name] needs a PCP and he also needs an appointment around 06/12/15.    Mr. Lappe- you are doing very well at taking your blood sugar, insulin and caring for your body!!  To try to decrease your dinner blood sugars- have vegetables and protein for lunch daily. Don;t skip the opportunity to have vegetables!!!  I will request your prescriptions for lancets, lancing device, strips, syringes and lisinopril.   Butch Penny Plyler [phone]

## 2015-06-01 MED ORDER — GLUCOSE BLOOD VI STRP
ORAL_STRIP | Status: DC
Start: 2015-06-01 — End: 2016-06-22

## 2015-06-01 MED ORDER — LISINOPRIL 20 MG PO TABS: 20.0000 mg | ORAL_TABLET | Freq: Every day | ORAL | Status: DC

## 2015-06-01 MED ORDER — ACCU-CHEK FASTCLIX LANCET KIT: PACK | Status: AC

## 2015-06-01 MED ORDER — ACCU-CHEK FASTCLIX LANCETS MISC: Status: AC

## 2015-06-01 MED ORDER — "INSULIN SYRINGE-NEEDLE U-100 31G X 15/64"" 0.3 ML MISC": Status: DC

## 2015-06-28 ENCOUNTER — Ambulatory Visit: Payer: Medicare Other | Admitting: Dietician

## 2015-11-01 ENCOUNTER — Encounter: Payer: Self-pay | Admitting: Pharmacist

## 2015-11-01 NOTE — Progress Notes (Signed)
Patient ID: Dennis [name], male   DOB: [birth year], 70 y.o.   MRN: [id number]  Geriatrics Task Force 80-month adherence review according to [place]:  Novolin 70/30 never filled  Lisinopril 04/2015, 08/31/2015 (30D)  Pravastatin never filled (rx on file from 04/2015)

## 2015-11-23 ENCOUNTER — Encounter: Payer: Medicare Other | Admitting: Internal Medicine

## 2015-11-30 ENCOUNTER — Encounter: Payer: Self-pay | Admitting: *Deleted

## 2015-12-23 ENCOUNTER — Telehealth: Payer: Self-pay | Admitting: Dietician

## 2015-12-23 NOTE — Telephone Encounter (Signed)
Calling to follow up with MNT/DSMT and to remind patient he is due for a doctor's appointment. Woman answering phone said she'd let him know of the call.

## 2016-01-20 NOTE — Telephone Encounter (Signed)
Tried calling patient again. Unable to leave a message.

## 2016-01-24 ENCOUNTER — Telehealth: Payer: Self-pay | Admitting: Internal Medicine

## 2016-03-06 NOTE — Telephone Encounter (Signed)
Called to f/u with pt since he has not seen doctor for 10 months and no record in June or September that medications had been filled at his pharmacy.  Phone rings but no answering machine or voice mail available to leave message. Will send letter to his last known address to try to follow up with appointment. Yvonna Alanis, RN, 03/06/16, 4:28 PM

## 2016-03-08 ENCOUNTER — Encounter: Payer: Self-pay | Admitting: Internal Medicine

## 2016-04-11 ENCOUNTER — Telehealth: Payer: Self-pay | Admitting: Pharmacist

## 2016-04-13 NOTE — Progress Notes (Signed)
Dennis [name] is a 70 y.o. male who was contacted on behalf of [facility] Geriatrics Task Force. Unable to reach

## 2016-06-11 ENCOUNTER — Encounter: Payer: Self-pay | Admitting: Internal Medicine

## 2016-06-22 ENCOUNTER — Other Ambulatory Visit: Payer: Self-pay | Admitting: Dietician

## 2016-06-22 ENCOUNTER — Encounter: Payer: Self-pay | Admitting: Internal Medicine

## 2016-06-22 ENCOUNTER — Ambulatory Visit (INDEPENDENT_AMBULATORY_CARE_PROVIDER_SITE_OTHER): Payer: Medicare Other | Admitting: Internal Medicine

## 2016-06-22 ENCOUNTER — Encounter (INDEPENDENT_AMBULATORY_CARE_PROVIDER_SITE_OTHER): Payer: Self-pay

## 2016-06-22 VITALS — BP 131/65 | HR 86 | Temp 98.4°F | Wt 218.5 lb

## 2016-06-22 DIAGNOSIS — D75839 Thrombocytosis, unspecified: Secondary | ICD-10-CM

## 2016-06-22 DIAGNOSIS — E119 Type 2 diabetes mellitus without complications: Secondary | ICD-10-CM | POA: Diagnosis not present

## 2016-06-22 DIAGNOSIS — B351 Tinea unguium: Secondary | ICD-10-CM | POA: Diagnosis not present

## 2016-06-22 DIAGNOSIS — D473 Essential (hemorrhagic) thrombocythemia: Secondary | ICD-10-CM

## 2016-06-22 DIAGNOSIS — Z79899 Other long term (current) drug therapy: Secondary | ICD-10-CM | POA: Diagnosis not present

## 2016-06-22 DIAGNOSIS — R748 Abnormal levels of other serum enzymes: Secondary | ICD-10-CM

## 2016-06-22 DIAGNOSIS — E1165 Type 2 diabetes mellitus with hyperglycemia: Principal | ICD-10-CM

## 2016-06-22 DIAGNOSIS — Z794 Long term (current) use of insulin: Principal | ICD-10-CM

## 2016-06-22 DIAGNOSIS — IMO0002 Reserved for concepts with insufficient information to code with codable children: Secondary | ICD-10-CM

## 2016-06-22 DIAGNOSIS — E785 Hyperlipidemia, unspecified: Secondary | ICD-10-CM | POA: Diagnosis not present

## 2016-06-22 DIAGNOSIS — Z87891 Personal history of nicotine dependence: Secondary | ICD-10-CM | POA: Diagnosis not present

## 2016-06-22 DIAGNOSIS — I10 Essential (primary) hypertension: Secondary | ICD-10-CM | POA: Diagnosis not present

## 2016-06-22 DIAGNOSIS — Z0000 Encounter for general adult medical examination without abnormal findings: Secondary | ICD-10-CM

## 2016-06-22 DIAGNOSIS — M545 Low back pain: Secondary | ICD-10-CM

## 2016-06-22 DIAGNOSIS — D696 Thrombocytopenia, unspecified: Secondary | ICD-10-CM | POA: Diagnosis present

## 2016-06-22 DIAGNOSIS — R768 Other specified abnormal immunological findings in serum: Secondary | ICD-10-CM

## 2016-06-22 DIAGNOSIS — K921 Melena: Secondary | ICD-10-CM | POA: Insufficient documentation

## 2016-06-22 DIAGNOSIS — Z23 Encounter for immunization: Secondary | ICD-10-CM

## 2016-06-22 DIAGNOSIS — E1140 Type 2 diabetes mellitus with diabetic neuropathy, unspecified: Secondary | ICD-10-CM

## 2016-06-22 DIAGNOSIS — F10988 Alcohol use, unspecified with other alcohol-induced disorder: Secondary | ICD-10-CM

## 2016-06-22 DIAGNOSIS — R945 Abnormal results of liver function studies: Secondary | ICD-10-CM

## 2016-06-22 DIAGNOSIS — G8929 Other chronic pain: Secondary | ICD-10-CM

## 2016-06-22 LAB — GLUCOSE, CAPILLARY: GLUCOSE-CAPILLARY: 90 mg/dL (ref 65–99)

## 2016-06-22 LAB — HM DIABETES EYE EXAM

## 2016-06-22 LAB — POCT GLYCOSYLATED HEMOGLOBIN (HGB A1C): HEMOGLOBIN A1C: 5.8

## 2016-06-22 MED ORDER — INSULIN NPH ISOPHANE & REGULAR (70-30) 100 UNIT/ML ~~LOC~~ SUSP: SUBCUTANEOUS | 11 refills | Status: DC

## 2016-06-22 MED ORDER — "INSULIN SYRINGE-NEEDLE U-100 31G X 15/64"" 0.3 ML MISC": 5 refills | Status: AC

## 2016-06-22 MED ORDER — PANTOPRAZOLE SODIUM 40 MG PO TBEC: 40.0000 mg | DELAYED_RELEASE_TABLET | Freq: Every day | ORAL | 2 refills | Status: AC

## 2016-06-22 MED ORDER — GLUCOSE BLOOD VI STRP: ORAL_STRIP | 5 refills | Status: AC

## 2016-06-22 NOTE — Assessment & Plan Note (Signed)
BP Readings from Last 3 Encounters:  06/22/16 131/65  05/12/15 (!) 160/66  04/25/15 (!) 161/63    Lab Results  Component Value Date   NA 144 04/25/2015   K 4.7 04/25/2015   CREATININE 0.78 04/25/2015    Assessment: Blood pressure control:  controlled Progress toward BP goal:   At goal Comments: Controlled off of medications  Plan: Medications:  continue off of medications Other plans: Needs microalb/creatinine ratio at follow up visit

## 2016-06-22 NOTE — Assessment & Plan Note (Signed)
Referred to gastroenterology for colonoscopy Completed retinal scan today Received flu shot today

## 2016-06-22 NOTE — Assessment & Plan Note (Addendum)
Patient has a chronic history of elevated LFTs. These have been mildly elevated in the setting of chronic alcohol use. Last lab check was over one year ago AST at that time was 43, ALT 67, Alk phos and T. Bili normal. Patient admits to some alcohol use. He reports drinking 2-3 times per week and drinking 2 beers at each time.  Plan: -Repeat complete metabolic panel -Check hepatitis C antibody -Check hepatitis B antibody and hepatitis B surface antigen -Consider right upper quadrant ultrasound if continued concern for fatty liver disease  ADDENDUM: -Hep C ab positive, recommend checking a HCV RNA liver to determine if patient has chronic Hep C or has cleared the infection -Hep B ab negative- indicating patient is not immunized. Consider immunization regimen starting at follow up visit.

## 2016-06-22 NOTE — Assessment & Plan Note (Addendum)
Patient admits to a 1 week history of dark stools with dark red streaking. He denies any blood clots or bright red blood. He denies history of hemorrhoids. He has never had a colonoscopy. He denies associated lightheadedness chest pain and shortness of breath diarrhea nausea or vomiting. He denies any family history of colon cancer. He states that he will have episodes like this on and off for the past 20 years. However, these episodes don't normally last for a week. Patient does admit to using Goody powders. He denies any abdominal pain nausea or vomiting. It is unclear to me if this is an upper GI bleed or bleeding from the colon. Patient is hesitant to receive a colonoscopy stating that he is scared. He is also concerned about the cost of the procedure. We discussed extensively the procedure of a colonoscopy and he felt more reassured. However, I am concerned about the cost if he does need a colonoscopy.  Plan: -Referral to gastroenterology for discussion of colonoscopy for melena -CBC and complete metabolic panel today -Stop using Goody powders or other NSAIDs -Start Protonix 40 mg once a day

## 2016-06-22 NOTE — Patient Instructions (Signed)
[name],  It was a pleasure to meet you today. For your diabetes: Please take your insulin 25 units in the morning and 20 units at night. We have decreased her nighttime dose due to low blood sugars in the morning.  For your blood in her stool: We will check your blood work today and we will refer you to gastroenterology for consideration of a colonoscopy. We can discuss with them whether or not they think this is important. I do feel this is an important thing to follow up on to make sure that you're not having any gastrointestinal issues like an ulcer or a lesion in your colon. If he began to feel lightheaded or notice increased blood loss, please let us now.  For your liver: We will check blood work today to see if your liver enzymes are still elevated.  Please follow up in 1 month.

## 2016-06-22 NOTE — Assessment & Plan Note (Addendum)
Lab Results  Component Value Date   HGBA1C 5.8 06/22/2016   HGBA1C 11.8 04/25/2015   HGBA1C 8.3 07/04/2011     Assessment: Diabetes control:  controlled Progress toward A1C goal:   at goal Comments: Patient reports that he is having hypoglycemic events in the morning when he first wakes up in the 60s to 70s. He did not bring his meter and, but he states his blood glucose in the afternoon typically runs 90 to 130 before dinner. He reports compliance with his insulin 70-30 25 units in the morning and 25 units at night.  Plan: Medications:  Decrease insulin to 25 units in the morning and 20 units at night. Home glucose monitoring: Frequency:  4 times daily Timing:  before meals at bedtime Other plans: Follow up in one month. Patient is to notify us if he continues to have hypoglycemic events in the morning. Patient received his retinal scan today. I would also consider putting this patient on a moderate intensity statin medication if he is agreeable.

## 2016-06-22 NOTE — Assessment & Plan Note (Signed)
Of note patient has a history of thrombocytosis. CBC showed platelet count in the 400s. Recommendations were to repeat CBC and check a peripheral smear.  Plan: -Repeat CBC, peripheral smear if needed

## 2016-06-22 NOTE — Progress Notes (Signed)
    CC: Follow-up for diabetes  HPI: Dennis [name] is a 71 y.o. male with PMHx of hypertension, uncontrolled diabetes, thrombocytosis, elevated liver enzymes, hyperlipidemia, obesity who presents to the clinic for follow-up for diabetes. The patient has not been seen in our clinic for over one year. Unfortunately, he states that his wife of 30 years passed away this past 05-26-2016. He states he's been doing okay since this. However, he admits to decreased appetite. He knows how to cook for himself and overall feels well-he denies depression and sadness or decreased pleasure in previous activities that he enjoyed.  Patient presents today for follow-up for his type 2 diabetes. Last A1c was in 2015-05-27 and 11.8. He is  taking NovoLog 70-30 25 units twice a day and notes hypoglycemic episodes in the morning in the 60-70s. He is supposed to be on lisinopril 20 mg once a day but has run out. His diabetic eye exam was normal last year. Patient is not on a statin medication.  Of note, patient has a history of thrombocytosis. Last CBC showed platelet count in the 400s. Recommendations were to repeat her CBC and check a peripheral blood smear.  Patient has a history of hypertension. Blood pressure today is 141/61. Patient is supposed to be on lisinopril 20 mg once a day.  Patient has a chronic history of elevated LFTs. These have been mildly elevated in the setting of chronic alcohol use. Last lab check was over one year ago AST at that time was 43, ALT 67, Alk phos and T. Bili normal. Patient admits to some alcohol use. He reports drinking 2-3 times per week and drinking 2 beers at each time.  Past Medical History:  Diagnosis Date  . Chronic kidney disease    acute kidney injury, in setting of gentamicin toxicity,  . Diabetes mellitus   . Hypertension   . 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   Physical Exam: Vitals:   06/22/16 1326 06/22/16 1420  BP: (!) 141/61 131/65  Pulse: 95 86  Temp: 98.4 F (36.9 C)   TempSrc: Oral   SpO2: 100%   Weight: 218 lb 8 oz (99.1 kg)    General: Vital signs reviewed.  Patient is well-developed and well-nourished, in no acute distress and cooperative with exam.  Eyes: PERRLA, conjunctivae normal.  Cardiovascular: RRR, S1 normal, S2 normal, no murmurs, gallops, or rubs. Pulmonary/Chest: Clear to auscultation bilaterally, no wheezes, rales, or rhonchi. Abdominal: Soft, non-tender, non-distended, BS +  Extremities: No lower extremity edema bilaterally, pulses symmetric and intact bilaterally. No cyanosis or clubbing. Bilateral onychomycosis. Long nails. Skin: Warm, dry and intact.  Psychiatric: Flat mood and affect. speech and behavior is normal. Cognition and memory are normal.   Assessment & Plan:  See encounters tab for problem based medical decision making. Patient discussed with Dr. Beryle Beams

## 2016-06-22 NOTE — Addendum Note (Signed)
Addended by: Resa Miner on: 06/22/2016 06:03 PM   Modules accepted: Orders

## 2016-06-22 NOTE — Assessment & Plan Note (Addendum)
Consider adding moderate intensity statin

## 2016-06-23 LAB — CMP14 + ANION GAP
ALBUMIN: 3.7 g/dL (ref 3.6–4.8)
ALK PHOS: 116 IU/L (ref 39–117)
ALT: 31 IU/L (ref 0–44)
ANION GAP: 17 mmol/L (ref 10.0–18.0)
AST: 36 IU/L (ref 0–40)
Albumin/Globulin Ratio: 1.1 — ABNORMAL LOW (ref 1.2–2.2)
BUN / CREAT RATIO: 16 (ref 10–24)
BUN: 14 mg/dL (ref 8–27)
Bilirubin Total: 0.3 mg/dL (ref 0.0–1.2)
CO2: 22 mmol/L (ref 18–29)
Calcium: 9.3 mg/dL (ref 8.6–10.2)
Chloride: 102 mmol/L (ref 96–106)
Creatinine, Ser: 0.88 mg/dL (ref 0.76–1.27)
GFR calc non Af Amer: 88 mL/min/{1.73_m2} (ref >59)
GFR, EST AFRICAN AMERICAN: 101 mL/min/{1.73_m2} (ref >59)
GLOBULIN, TOTAL: 3.4 g/dL (ref 1.5–4.5)
Glucose: 100 mg/dL — ABNORMAL HIGH (ref 65–99)
Potassium: 4.5 mmol/L (ref 3.5–5.2)
SODIUM: 141 mmol/L (ref 134–144)
TOTAL PROTEIN: 7.1 g/dL (ref 6.0–8.5)

## 2016-06-23 LAB — CBC
Hematocrit: 37.8 % (ref 37.5–51.0)
Hemoglobin: 12.6 g/dL — ABNORMAL LOW (ref 13.0–17.7)
MCH: 28.3 pg (ref 26.6–33.0)
MCHC: 33.3 g/dL (ref 31.5–35.7)
MCV: 85 fL (ref 79–97)
PLATELETS: 554 10*3/uL — AB (ref 150–379)
RBC: 4.45 x10E6/uL (ref 4.14–5.80)
RDW: 14 % (ref 12.3–15.4)
WBC: 7.2 10*3/uL (ref 3.4–10.8)

## 2016-06-23 LAB — HEPATITIS C ANTIBODY: Hep C Virus Ab: >11 s/co ratio — ABNORMAL HIGH (ref 0.0–0.9)

## 2016-06-23 LAB — HEPATITIS B SURFACE ANTIBODY,QUALITATIVE: Hep B Surface Ab, Qual: NONREACTIVE

## 2016-06-23 LAB — HEPATITIS B SURFACE ANTIGEN: HEP B S AG: NEGATIVE

## 2016-06-25 ENCOUNTER — Encounter: Payer: Self-pay | Admitting: Internal Medicine

## 2016-06-25 ENCOUNTER — Telehealth: Payer: Self-pay | Admitting: Internal Medicine

## 2016-06-25 NOTE — Progress Notes (Signed)
Medicine attending: Medical history, presenting problems, physical findings, and medications, reviewed with resident physician Dr Alexa Burns on the day of the patient visit and I concur with her evaluation and management plan. 

## 2016-06-25 NOTE — Telephone Encounter (Signed)
I called Dennis [name] to discuss his lab results. His liver enzymes are normal. However, his hepatitis C antibody was positive. I explained what this result could mean to the patient and that we should follow-up with laboratory work when he comes back to see [place] in March. He was agreeable. Of note, his hepatitis B antibody was negative indicating he is not immunized. Immunization should be discussed with the patient on follow-up.

## 2016-06-27 ENCOUNTER — Ambulatory Visit: Payer: Medicare Other | Admitting: Nurse Practitioner

## 2016-06-27 ENCOUNTER — Encounter: Payer: Self-pay | Admitting: Dietician

## 2016-06-27 NOTE — Addendum Note (Signed)
Addended by: Hulan Fray on: 06/27/2016 07:17 PM   Modules accepted: Orders

## 2016-07-24 ENCOUNTER — Telehealth: Payer: Self-pay | Admitting: Internal Medicine

## 2016-07-24 NOTE — Telephone Encounter (Signed)
APT. REMINDER CALL, LMTCB °

## 2016-07-25 ENCOUNTER — Encounter: Payer: Self-pay | Admitting: Internal Medicine

## 2016-07-25 ENCOUNTER — Ambulatory Visit (INDEPENDENT_AMBULATORY_CARE_PROVIDER_SITE_OTHER): Payer: Medicare Other | Admitting: Internal Medicine

## 2016-07-25 VITALS — BP 133/54 | HR 56 | Temp 98.6°F | Ht 72.0 in | Wt 213.8 lb

## 2016-07-25 DIAGNOSIS — Z87891 Personal history of nicotine dependence: Secondary | ICD-10-CM | POA: Diagnosis not present

## 2016-07-25 DIAGNOSIS — I10 Essential (primary) hypertension: Secondary | ICD-10-CM

## 2016-07-25 DIAGNOSIS — Z794 Long term (current) use of insulin: Secondary | ICD-10-CM | POA: Diagnosis not present

## 2016-07-25 DIAGNOSIS — E119 Type 2 diabetes mellitus without complications: Secondary | ICD-10-CM | POA: Diagnosis present

## 2016-07-25 DIAGNOSIS — D649 Anemia, unspecified: Secondary | ICD-10-CM | POA: Diagnosis not present

## 2016-07-25 DIAGNOSIS — E118 Type 2 diabetes mellitus with unspecified complications: Secondary | ICD-10-CM | POA: Diagnosis not present

## 2016-07-25 DIAGNOSIS — D473 Essential (hemorrhagic) thrombocythemia: Secondary | ICD-10-CM | POA: Diagnosis not present

## 2016-07-25 DIAGNOSIS — K921 Melena: Secondary | ICD-10-CM

## 2016-07-25 DIAGNOSIS — D75839 Thrombocytosis, unspecified: Secondary | ICD-10-CM

## 2016-07-25 DIAGNOSIS — R768 Other specified abnormal immunological findings in serum: Secondary | ICD-10-CM

## 2016-07-25 DIAGNOSIS — Z23 Encounter for immunization: Secondary | ICD-10-CM

## 2016-07-25 DIAGNOSIS — Z0000 Encounter for general adult medical examination without abnormal findings: Secondary | ICD-10-CM

## 2016-07-25 MED ORDER — METFORMIN HCL 500 MG PO TABS: 500.0000 mg | ORAL_TABLET | Freq: Every day | ORAL | 3 refills | Status: AC

## 2016-07-25 MED ORDER — INSULIN NPH ISOPHANE & REGULAR (70-30) 100 UNIT/ML ~~LOC~~ SUSP: SUBCUTANEOUS | 11 refills | Status: AC

## 2016-07-25 NOTE — Progress Notes (Signed)
   CC: T2DM  HPI:  Dennis [name] is a 71 y.o. male with PMH as listed below including HTN, T2DM, thrombocytosis, and positive Hep C antibody who presents for follow up management of his DM. Please see problem based charting for status of patient's chronic medical issues.  T2DM: Patient diagnosed in 2011. Last A1c was 5.8 on 06/22/2016. He currently takes Novolin 70/30 25 units am and 20 units pm. He checks his blood sugars 1-2 times per day with most readings between 120-170. He denies any recent hypoglycemic or hyperglycemic symptoms. He has never taken oral medications for his diabetes.  HTN: Patient previously prescribed Lisinopril, but is currently off antihypertensive medications. BP this visit is 133/54.  Thrombocytosis: Patient has a history of thrombocytosis with platelet count of 600k in December 2011 and most recently 554k on 06/22/2016.   Positive Hep C Antibody: Hep C antibody was positive on last visit. No prior known history of treated hepatitis C.  Blood in Stool: Patient reports 15-20 years history of intermittent dark red blood mixed in with his stool. He has not had a colonoscopy. He was referred to GI last month for colonoscopy and further evaluation but missed his appointment.  Healthcare maintenance: Patient found to have a negative Hep B antibody suggesting no prior immunization. He has not had a colonoscopy and missed his appointment with GI last month.    Past Medical History:  Diagnosis Date  . Chronic kidney disease    acute kidney injury, in setting of gentamicin toxicity,  . Diabetes mellitus 2011  . Hypertension   . 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  Respiratory: Negative for cough, sputum production and shortness of breath.   Cardiovascular: Negative for chest pain and leg swelling.  Gastrointestinal: Positive for  blood in stool. Negative for melena.  Genitourinary: Negative for dysuria and hematuria.  Musculoskeletal: Positive for back pain.  Neurological: Negative for dizziness, focal weakness and loss of consciousness.     Physical Exam:  Vitals:   07/25/16 1513  BP: (!) 133/54  Pulse: (!) 56  Temp: 98.6 F (37 C)  TempSrc: Oral  SpO2: 100%  Weight: 213 lb 12.8 oz (97 kg)  Height: 6' (1.829 m)   Physical Exam  Constitutional: He is oriented to person, place, and time. He appears well-developed and well-nourished. No distress.  HENT:  Head: Normocephalic and atraumatic.  Eyes: No scleral icterus.  Cardiovascular: Normal rate and regular rhythm.   Pulmonary/Chest: Effort normal. No respiratory distress. He has no wheezes.  Musculoskeletal:       Thoracic back: He exhibits no tenderness.       Lumbar back: He exhibits no tenderness.  Neurological: He is alert and oriented to person, place, and time.    Assessment & Plan:   See Encounters Tab for problem based charting.  Patient discussed with Dr. Beryle Beams

## 2016-07-25 NOTE — Patient Instructions (Addendum)
It was a pleasure to meet you Dennis [name].  We are checking blood work today. I will let you know the results.  We are giving you the first shot of the Hepatitis B vaccine today. We will repeat the shots in 1 month and 6 months from now.  Please call the GI doctors to schedule a colonoscopy.  For your diabetes, decrease your insulin to 22 units in the morning and 18 units at night.  We are adding Metformin for your diabetes. Take this as follows: Metformin 500 mg once a day for 1 week, then Metformin 500 mg twice a day for 1 week, then Metformin 1000 mg in the morning and 500 mg at night for 1 week, then Metformin 1000 mg twice a day  Please follow up with me in 2 months or sooner if needed.   Metformin tablets What is this medicine? METFORMIN (met FOR min) is used to treat type 2 diabetes. It helps to control blood sugar. Treatment is combined with diet and exercise. This medicine can be used alone or with other medicines for diabetes. This medicine may be used for other purposes; ask your health care provider or pharmacist if you have questions. COMMON BRAND NAME(S): Glucophage What should I tell my health care provider before I take this medicine? They need to know if you have any of these conditions: -anemia -dehydration -heart disease -frequently drink alcohol-containing beverages -kidney disease -liver disease -polycystic ovary syndrome -serious infection or injury -vomiting -an unusual or allergic reaction to metformin, other medicines, foods, dyes, or preservatives -pregnant or trying to get pregnant -breast-feeding How should I use this medicine? Take this medicine by mouth with a glass of water. Follow the directions on the prescription label. Take this medicine with food. Take your medicine at regular intervals. Do not take your medicine more often than directed. Do not stop taking except on your doctor's advice. Talk to your pediatrician regarding the use of this  medicine in children. While this drug may be prescribed for children as young as 49 years of age for selected conditions, precautions do apply. Overdosage: If you think you have taken too much of this medicine contact a poison control center or emergency room at once. NOTE: This medicine is only for you. Do not share this medicine with others. What if I miss a dose? If you miss a dose, take it as soon as you can. If it is almost time for your next dose, take only that dose. Do not take double or extra doses. What may interact with this medicine? Do not take this medicine with any of the following medications: -dofetilide -certain contrast medicines given before X-rays, CT scans, MRI, or other procedures This medicine may also interact with the following medications: -acetazolamide -certain antiviral medicines for HIV or AIDS or for hepatitis, like adefovir, dolutegravir, emtricitabine, entecavir, lamivudine, paritaprevir, or tenofovir -cimetidine -cobicistat -crizotinib -dichlorphenamide -digoxin -diuretics -male hormones, like estrogens or progestins and birth control pills -glycopyrrolate -isoniazid -lamotrigine -medicines for blood pressure, heart disease, irregular heart beat -memantine -midodrine -methazolamide -morphine -niacin -phenothiazines like chlorpromazine, mesoridazine, prochlorperazine, thioridazine -phenytoin -procainamide -propantheline -quinidine -quinine -ranitidine -ranolazine -steroid medicines like prednisone or cortisone -stimulant medicines for attention disorders, weight loss, or to stay awake -thyroid medicines -topiramate -trimethoprim -trospium -vancomycin -vandetanib -zonisamide This list may not describe all possible interactions. Give your health care provider a list of all the medicines, herbs, non-prescription drugs, or dietary supplements you use. Also tell them if you smoke, drink alcohol,  or use illegal drugs. Some items may interact  with your medicine. What should I watch for while using this medicine? Visit your doctor or health care professional for regular checks on your progress. A test called the HbA1C (A1C) will be monitored. This is a simple blood test. It measures your blood sugar control over the last 2 to 3 months. You will receive this test every 3 to 6 months. Learn how to check your blood sugar. Learn the symptoms of low and high blood sugar and how to manage them. Always carry a quick-source of sugar with you in case you have symptoms of low blood sugar. Examples include hard sugar candy or glucose tablets. Make sure others know that you can choke if you eat or drink when you develop serious symptoms of low blood sugar, such as seizures or unconsciousness. They must get medical help at once. Tell your doctor or health care professional if you have high blood sugar. You might need to change the dose of your medicine. If you are sick or exercising more than usual, you might need to change the dose of your medicine. Do not skip meals. Ask your doctor or health care professional if you should avoid alcohol. Many nonprescription cough and cold products contain sugar or alcohol. These can affect blood sugar. This medicine may cause ovulation in premenopausal women who do not have regular monthly periods. This may increase your chances of becoming pregnant. You should not take this medicine if you become pregnant or think you may be pregnant. Talk with your doctor or health care professional about your birth control options while taking this medicine. Contact your doctor or health care professional right away if think you are pregnant. If you are going to need surgery, a MRI, CT scan, or other procedure, tell your doctor that you are taking this medicine. You may need to stop taking this medicine before the procedure. Wear a medical ID bracelet or chain, and carry a card that describes your disease and details of your medicine  and dosage times. What side effects may I notice from receiving this medicine? Side effects that you should report to your doctor or health care professional as soon as possible: -allergic reactions like skin rash, itching or hives, swelling of the face, lips, or tongue -breathing problems -feeling faint or lightheaded, falls -muscle aches or pains -signs and symptoms of low blood sugar such as feeling anxious, confusion, dizziness, increased hunger, unusually weak or tired, sweating, shakiness, cold, irritable, headache, blurred vision, fast heartbeat, loss of consciousness -slow or irregular heartbeat -unusual stomach pain or discomfort -unusually tired or weak Side effects that usually do not require medical attention (report to your doctor or health care professional if they continue or are bothersome): -diarrhea -headache -heartburn -metallic taste in mouth -nausea -stomach gas, upset This list may not describe all possible side effects. Call your doctor for medical advice about side effects. You may report side effects to FDA at 1-800-FDA-1088. Where should I keep my medicine? Keep out of the reach of children. Store at room temperature between 15 and 30 degrees C (59 and 86 degrees F). Protect from moisture and light. Throw away any unused medicine after the expiration date. NOTE: This sheet is a summary. It may not cover all possible information. If you have questions about this medicine, talk to your doctor, pharmacist, or health care provider.  2018 Elsevier/Gold Standard (2015-11-09 15:34:19)

## 2016-07-26 LAB — MICROALBUMIN / CREATININE URINE RATIO
Creatinine, Urine: 187.6 mg/dL
Microalb/Creat Ratio: 5 mg/g creat (ref 0.0–30.0)
Microalbumin, Urine: 9.3 ug/mL

## 2016-07-26 NOTE — Assessment & Plan Note (Signed)
Patient found to have a negative Hep B antibody suggesting no prior immunization. He has not had a colonoscopy and missed his appointment with GI last month.  Patient given first in series of Hep B vaccination. 2nd and 3rd doses can be given at 1 and 6 months from now respectively.  Patient advised to reschedule with GI for possible colonoscopy.

## 2016-07-26 NOTE — Assessment & Plan Note (Signed)
Patient reports 15-20 years history of intermittent dark red blood mixed in with his stool. He has not had a colonoscopy. He was referred to GI last month for colonoscopy and further evaluation but missed his appointment.  I discussed the concerns about his chronic GI bleeding and that we should continue to pursue evaluation with a colonoscopy. He does say that he is concerned about having a colonoscopy, but also about his abnormal bleeding. He tells me he will contact the GI office to try to reschedule his appointment. - Reschedule appointment with GI

## 2016-07-26 NOTE — Assessment & Plan Note (Addendum)
Hep C antibody was positive on last visit. No prior known history of treated hepatitis C.  Checking HCV RNA this visit. If RNA elevated, will refer to ID for possible treatment. Patient advised of plan and is in agreement. He is also given the first in series of Hep B vaccinations.  ADDENDUM: HCV RNA viral load detected at 167,000 IU/mL. Will place referral to Infectious Disease. Attempted to call patient with results however there was no answer and no option for voicemail.

## 2016-07-26 NOTE — Assessment & Plan Note (Signed)
Patient has a history of thrombocytosis with platelet count of 600k in December 2011 and most recently 554k on 06/22/2016.  Iron/TIBC/Ferritin/ %Sat    Component Value Date/Time   IRON 22 (L) 05/07/2010 3437   TIBC 194 (L) 05/07/2010 3578   FERRITIN 734 (H) 05/07/2010 9784   IRONPCTSAT 11 (L) 05/07/2010 7841    Most common causes of thrombocytosis are iron deficiency followed by reactive states from infection or chronic inflammation. He does have a mild normocytic anemia on CBC last month. Prior iron studies from 2011 are listed above. Unfortuntely, we are unable to add on studies to blood work collected this visit. On follow up I would like to repeat a CBC with peripheral smear and obtain a ferritin and iron panel. If these are inconclusive, would consider checking JAK-2 afterwards. I have also discussed with him the importance of rescheduling with GI to consider colonoscopy for his chronic blood streaked stools.

## 2016-07-26 NOTE — Progress Notes (Signed)
Medicine attending: Medical history, presenting problems, physical findings, and medications, reviewed with resident physician Dr Vishal Patel on the day of the patient visit and I concur with his evaluation and management plan. 

## 2016-07-26 NOTE — Assessment & Plan Note (Signed)
Patient previously prescribed Lisinopril, but is currently off antihypertensive medications. BP this visit is 133/54.  BP Readings from Last 3 Encounters:  07/25/16 (!) 133/54  06/22/16 131/65  05/12/15 (!) 160/66   BP is controlled off medications. He does have diabetes, however urine microalbumin/creatinine this visit was not suggestive of micro/macroalbuminuria. Will continue to monitor off antihypertensives.

## 2016-07-26 NOTE — Assessment & Plan Note (Signed)
Patient diagnosed in 2011. Last A1c was 5.8 on 06/22/2016. He currently takes Novolin 70/30 25 units am and 20 units pm. He checks his blood sugars 1-2 times per day with most readings between 120-170. He denies any recent hypoglycemic or hyperglycemic symptoms. He has never taken oral medications for his diabetes.  Patient's diabetes is currently well controlled. We will add Metformin to his medications and plan to reduce his insulin and possibly come off insulin altogether down the road. - Start Metformin, taper up to 1000 mg BID - Decrease Novolin 70/30 to 22 units AM and 18 units PM - Repeat A1c in 2 months

## 2016-07-27 LAB — HCV RNA QUANT
HCV log10: 5.223 log10 IU/mL
Hepatitis C Quantitation: 167000 IU/mL

## 2016-07-27 NOTE — Addendum Note (Signed)
Addended by: Lenore Cordia on: 07/27/2016 01:21 PM   Modules accepted: Orders

## 2016-08-27 ENCOUNTER — Ambulatory Visit: Payer: Medicare Other

## 2016-08-27 NOTE — Addendum Note (Signed)
Addended by: Hulan Fray on: 08/27/2016 07:09 PM   Modules accepted: Orders

## 2016-09-19 ENCOUNTER — Encounter: Payer: Medicare Other | Admitting: Internal Medicine

## 2017-03-01 ENCOUNTER — Inpatient Hospital Stay (HOSPITAL_COMMUNITY)
Admission: EM | Admit: 2017-03-01 | Discharge: 2017-03-14 | DRG: 870 | Disposition: E | Payer: Medicare Other | Attending: Internal Medicine | Admitting: Internal Medicine

## 2017-03-01 ENCOUNTER — Emergency Department (HOSPITAL_COMMUNITY): Payer: Medicare Other

## 2017-03-01 ENCOUNTER — Encounter (HOSPITAL_COMMUNITY): Payer: Self-pay | Admitting: *Deleted

## 2017-03-01 DIAGNOSIS — G40A11 Absence epileptic syndrome, intractable, with status epilepticus: Secondary | ICD-10-CM | POA: Diagnosis present

## 2017-03-01 DIAGNOSIS — E1122 Type 2 diabetes mellitus with diabetic chronic kidney disease: Secondary | ICD-10-CM | POA: Diagnosis present

## 2017-03-01 DIAGNOSIS — J9601 Acute respiratory failure with hypoxia: Secondary | ICD-10-CM | POA: Diagnosis not present

## 2017-03-01 DIAGNOSIS — Z978 Presence of other specified devices: Secondary | ICD-10-CM | POA: Diagnosis not present

## 2017-03-01 DIAGNOSIS — E861 Hypovolemia: Secondary | ICD-10-CM | POA: Insufficient documentation

## 2017-03-01 DIAGNOSIS — R571 Hypovolemic shock: Secondary | ICD-10-CM | POA: Diagnosis not present

## 2017-03-01 DIAGNOSIS — J9690 Respiratory failure, unspecified, unspecified whether with hypoxia or hypercapnia: Secondary | ICD-10-CM

## 2017-03-01 DIAGNOSIS — L8990 Pressure ulcer of unspecified site, unspecified stage: Secondary | ICD-10-CM | POA: Insufficient documentation

## 2017-03-01 DIAGNOSIS — R55 Syncope and collapse: Secondary | ICD-10-CM | POA: Diagnosis not present

## 2017-03-01 DIAGNOSIS — N182 Chronic kidney disease, stage 2 (mild): Secondary | ICD-10-CM | POA: Diagnosis present

## 2017-03-01 DIAGNOSIS — R402322 Coma scale, best motor response, extension, at arrival to emergency department: Secondary | ICD-10-CM | POA: Diagnosis present

## 2017-03-01 DIAGNOSIS — Z452 Encounter for adjustment and management of vascular access device: Secondary | ICD-10-CM | POA: Diagnosis not present

## 2017-03-01 DIAGNOSIS — E785 Hyperlipidemia, unspecified: Secondary | ICD-10-CM | POA: Diagnosis present

## 2017-03-01 DIAGNOSIS — N179 Acute kidney failure, unspecified: Secondary | ICD-10-CM | POA: Diagnosis not present

## 2017-03-01 DIAGNOSIS — E860 Dehydration: Secondary | ICD-10-CM | POA: Diagnosis present

## 2017-03-01 DIAGNOSIS — Z7982 Long term (current) use of aspirin: Secondary | ICD-10-CM | POA: Diagnosis not present

## 2017-03-01 DIAGNOSIS — Z4682 Encounter for fitting and adjustment of non-vascular catheter: Secondary | ICD-10-CM | POA: Diagnosis not present

## 2017-03-01 DIAGNOSIS — R6521 Severe sepsis with septic shock: Secondary | ICD-10-CM | POA: Diagnosis present

## 2017-03-01 DIAGNOSIS — R402212 Coma scale, best verbal response, none, at arrival to emergency department: Secondary | ICD-10-CM | POA: Diagnosis present

## 2017-03-01 DIAGNOSIS — R188 Other ascites: Secondary | ICD-10-CM | POA: Diagnosis not present

## 2017-03-01 DIAGNOSIS — I248 Other forms of acute ischemic heart disease: Secondary | ICD-10-CM | POA: Diagnosis not present

## 2017-03-01 DIAGNOSIS — N289 Disorder of kidney and ureter, unspecified: Secondary | ICD-10-CM | POA: Diagnosis not present

## 2017-03-01 DIAGNOSIS — S3991XA Unspecified injury of abdomen, initial encounter: Secondary | ICD-10-CM | POA: Diagnosis not present

## 2017-03-01 DIAGNOSIS — G92 Toxic encephalopathy: Secondary | ICD-10-CM | POA: Diagnosis present

## 2017-03-01 DIAGNOSIS — D62 Acute posthemorrhagic anemia: Secondary | ICD-10-CM | POA: Diagnosis present

## 2017-03-01 DIAGNOSIS — R4189 Other symptoms and signs involving cognitive functions and awareness: Secondary | ICD-10-CM | POA: Diagnosis present

## 2017-03-01 DIAGNOSIS — N170 Acute kidney failure with tubular necrosis: Secondary | ICD-10-CM | POA: Diagnosis present

## 2017-03-01 DIAGNOSIS — E43 Unspecified severe protein-calorie malnutrition: Secondary | ICD-10-CM | POA: Diagnosis present

## 2017-03-01 DIAGNOSIS — M6282 Rhabdomyolysis: Secondary | ICD-10-CM | POA: Diagnosis present

## 2017-03-01 DIAGNOSIS — R220 Localized swelling, mass and lump, head: Secondary | ICD-10-CM | POA: Diagnosis not present

## 2017-03-01 DIAGNOSIS — I9589 Other hypotension: Secondary | ICD-10-CM

## 2017-03-01 DIAGNOSIS — R579 Shock, unspecified: Secondary | ICD-10-CM | POA: Diagnosis not present

## 2017-03-01 DIAGNOSIS — R001 Bradycardia, unspecified: Secondary | ICD-10-CM

## 2017-03-01 DIAGNOSIS — K7031 Alcoholic cirrhosis of liver with ascites: Secondary | ICD-10-CM | POA: Diagnosis present

## 2017-03-01 DIAGNOSIS — R4182 Altered mental status, unspecified: Secondary | ICD-10-CM | POA: Diagnosis not present

## 2017-03-01 DIAGNOSIS — A419 Sepsis, unspecified organism: Principal | ICD-10-CM | POA: Diagnosis present

## 2017-03-01 DIAGNOSIS — E875 Hyperkalemia: Secondary | ICD-10-CM | POA: Diagnosis present

## 2017-03-01 DIAGNOSIS — Z4659 Encounter for fitting and adjustment of other gastrointestinal appliance and device: Secondary | ICD-10-CM

## 2017-03-01 DIAGNOSIS — G40201 Localization-related (focal) (partial) symptomatic epilepsy and epileptic syndromes with complex partial seizures, not intractable, with status epilepticus: Secondary | ICD-10-CM | POA: Diagnosis not present

## 2017-03-01 DIAGNOSIS — R402112 Coma scale, eyes open, never, at arrival to emergency department: Secondary | ICD-10-CM | POA: Diagnosis present

## 2017-03-01 DIAGNOSIS — G40501 Epileptic seizures related to external causes, not intractable, with status epilepticus: Secondary | ICD-10-CM | POA: Diagnosis not present

## 2017-03-01 DIAGNOSIS — R0902 Hypoxemia: Secondary | ICD-10-CM | POA: Diagnosis not present

## 2017-03-01 DIAGNOSIS — J690 Pneumonitis due to inhalation of food and vomit: Secondary | ICD-10-CM | POA: Diagnosis present

## 2017-03-01 DIAGNOSIS — J9600 Acute respiratory failure, unspecified whether with hypoxia or hypercapnia: Secondary | ICD-10-CM | POA: Diagnosis not present

## 2017-03-01 DIAGNOSIS — Z66 Do not resuscitate: Secondary | ICD-10-CM | POA: Diagnosis present

## 2017-03-01 DIAGNOSIS — K7040 Alcoholic hepatic failure without coma: Secondary | ICD-10-CM | POA: Diagnosis present

## 2017-03-01 DIAGNOSIS — N171 Acute kidney failure with acute cortical necrosis: Secondary | ICD-10-CM | POA: Diagnosis not present

## 2017-03-01 DIAGNOSIS — R40243 Glasgow coma scale score 3-8, unspecified time: Secondary | ICD-10-CM | POA: Diagnosis not present

## 2017-03-01 DIAGNOSIS — E1165 Type 2 diabetes mellitus with hyperglycemia: Secondary | ICD-10-CM | POA: Diagnosis present

## 2017-03-01 DIAGNOSIS — E870 Hyperosmolality and hypernatremia: Secondary | ICD-10-CM | POA: Diagnosis not present

## 2017-03-01 DIAGNOSIS — Z6830 Body mass index (BMI) 30.0-30.9, adult: Secondary | ICD-10-CM

## 2017-03-01 DIAGNOSIS — R079 Chest pain, unspecified: Secondary | ICD-10-CM | POA: Diagnosis not present

## 2017-03-01 DIAGNOSIS — K860 Alcohol-induced chronic pancreatitis: Secondary | ICD-10-CM | POA: Diagnosis present

## 2017-03-01 DIAGNOSIS — F419 Anxiety disorder, unspecified: Secondary | ICD-10-CM | POA: Diagnosis present

## 2017-03-01 DIAGNOSIS — N189 Chronic kidney disease, unspecified: Secondary | ICD-10-CM | POA: Diagnosis not present

## 2017-03-01 DIAGNOSIS — R4020 Unspecified coma: Secondary | ICD-10-CM | POA: Diagnosis not present

## 2017-03-01 DIAGNOSIS — R569 Unspecified convulsions: Secondary | ICD-10-CM | POA: Diagnosis not present

## 2017-03-01 DIAGNOSIS — R031 Nonspecific low blood-pressure reading: Secondary | ICD-10-CM | POA: Diagnosis not present

## 2017-03-01 DIAGNOSIS — K7460 Unspecified cirrhosis of liver: Secondary | ICD-10-CM | POA: Diagnosis not present

## 2017-03-01 DIAGNOSIS — G936 Cerebral edema: Secondary | ICD-10-CM | POA: Diagnosis not present

## 2017-03-01 DIAGNOSIS — R404 Transient alteration of awareness: Secondary | ICD-10-CM | POA: Diagnosis not present

## 2017-03-01 DIAGNOSIS — Z7189 Other specified counseling: Secondary | ICD-10-CM

## 2017-03-01 DIAGNOSIS — E872 Acidosis: Secondary | ICD-10-CM | POA: Diagnosis not present

## 2017-03-01 DIAGNOSIS — D6489 Other specified anemias: Secondary | ICD-10-CM | POA: Diagnosis present

## 2017-03-01 DIAGNOSIS — E8809 Other disorders of plasma-protein metabolism, not elsewhere classified: Secondary | ICD-10-CM | POA: Diagnosis not present

## 2017-03-01 DIAGNOSIS — Z515 Encounter for palliative care: Secondary | ICD-10-CM | POA: Diagnosis not present

## 2017-03-01 DIAGNOSIS — Z9289 Personal history of other medical treatment: Secondary | ICD-10-CM

## 2017-03-01 DIAGNOSIS — G9340 Encephalopathy, unspecified: Secondary | ICD-10-CM | POA: Diagnosis not present

## 2017-03-01 DIAGNOSIS — B1920 Unspecified viral hepatitis C without hepatic coma: Secondary | ICD-10-CM | POA: Diagnosis present

## 2017-03-01 DIAGNOSIS — E874 Mixed disorder of acid-base balance: Secondary | ICD-10-CM | POA: Diagnosis present

## 2017-03-01 DIAGNOSIS — S0990XA Unspecified injury of head, initial encounter: Secondary | ICD-10-CM | POA: Diagnosis not present

## 2017-03-01 DIAGNOSIS — E8770 Fluid overload, unspecified: Secondary | ICD-10-CM | POA: Diagnosis not present

## 2017-03-01 DIAGNOSIS — E876 Hypokalemia: Secondary | ICD-10-CM | POA: Diagnosis not present

## 2017-03-01 DIAGNOSIS — S199XXA Unspecified injury of neck, initial encounter: Secondary | ICD-10-CM | POA: Diagnosis not present

## 2017-03-01 DIAGNOSIS — I959 Hypotension, unspecified: Secondary | ICD-10-CM | POA: Diagnosis not present

## 2017-03-01 DIAGNOSIS — G931 Anoxic brain damage, not elsewhere classified: Secondary | ICD-10-CM | POA: Diagnosis present

## 2017-03-01 DIAGNOSIS — G40901 Epilepsy, unspecified, not intractable, with status epilepticus: Secondary | ICD-10-CM | POA: Diagnosis not present

## 2017-03-01 DIAGNOSIS — R030 Elevated blood-pressure reading, without diagnosis of hypertension: Secondary | ICD-10-CM | POA: Diagnosis not present

## 2017-03-01 DIAGNOSIS — I129 Hypertensive chronic kidney disease with stage 1 through stage 4 chronic kidney disease, or unspecified chronic kidney disease: Secondary | ICD-10-CM | POA: Diagnosis present

## 2017-03-01 DIAGNOSIS — E11649 Type 2 diabetes mellitus with hypoglycemia without coma: Secondary | ICD-10-CM | POA: Diagnosis present

## 2017-03-01 HISTORY — DX: Bradycardia, unspecified: R00.1

## 2017-03-01 LAB — BPAM RBC
BLOOD PRODUCT EXPIRATION DATE: 201811062359
Blood Product Expiration Date: 201811082359
ISSUE DATE / TIME: 201810191009
ISSUE DATE / TIME: 201810191009
UNIT TYPE AND RH: 9500
Unit Type and Rh: 9500

## 2017-03-01 LAB — GLUCOSE, CAPILLARY
GLUCOSE-CAPILLARY: 178 mg/dL — AB (ref 65–99)
Glucose-Capillary: 213 mg/dL — ABNORMAL HIGH (ref 65–99)

## 2017-03-01 LAB — MRSA PCR SCREENING: MRSA by PCR: NEGATIVE

## 2017-03-01 LAB — I-STAT ARTERIAL BLOOD GAS, ED
Acid-Base Excess: 2 mmol/L (ref 0.0–2.0)
BICARBONATE: 25.4 mmol/L (ref 20.0–28.0)
O2 Saturation: 100 %
PO2 ART: 468 mmHg — AB (ref 83.0–108.0)
Patient temperature: 29.9
TCO2: 26 mmol/L (ref 22–32)
pCO2 arterial: 24.5 mmHg — ABNORMAL LOW (ref 32.0–48.0)
pH, Arterial: 7.599 — ABNORMAL HIGH (ref 7.350–7.450)

## 2017-03-01 LAB — TYPE AND SCREEN
ABO/RH(D): A POS
Antibody Screen: NEGATIVE
UNIT DIVISION: 0
Unit division: 0

## 2017-03-01 LAB — PREPARE FRESH FROZEN PLASMA
UNIT DIVISION: 0
Unit division: 0

## 2017-03-01 LAB — BASIC METABOLIC PANEL
Anion gap: 17 — ABNORMAL HIGH (ref 5–15)
BUN: 205 mg/dL — ABNORMAL HIGH (ref 6–20)
CHLORIDE: 119 mmol/L — AB (ref 101–111)
CO2: 19 mmol/L — ABNORMAL LOW (ref 22–32)
CREATININE: 3.74 mg/dL — AB (ref 0.61–1.24)
Calcium: 8.1 mg/dL — ABNORMAL LOW (ref 8.9–10.3)
GFR, EST AFRICAN AMERICAN: 17 mL/min — AB (ref >60)
GFR, EST NON AFRICAN AMERICAN: 15 mL/min — AB (ref >60)
Glucose, Bld: 271 mg/dL — ABNORMAL HIGH (ref 65–99)
Potassium: 4.6 mmol/L (ref 3.5–5.1)
SODIUM: 155 mmol/L — AB (ref 135–145)

## 2017-03-01 LAB — ABO/RH: ABO/RH(D): A POS

## 2017-03-01 LAB — I-STAT VENOUS BLOOD GAS, ED
BICARBONATE: 27.2 mmol/L (ref 20.0–28.0)
O2 SAT: 100 %
PO2 VEN: 332 mmHg — AB (ref 32.0–45.0)
TCO2: 29 mmol/L (ref 22–32)
pCO2, Ven: 55.2 mmHg (ref 44.0–60.0)
pH, Ven: 7.301 (ref 7.250–7.430)

## 2017-03-01 LAB — CDS SEROLOGY

## 2017-03-01 LAB — CBC
HCT: 39.3 % (ref 39.0–52.0)
HCT: 32.7 % — ABNORMAL LOW (ref 39.0–52.0)
HEMOGLOBIN: 11.8 g/dL — AB (ref 13.0–17.0)
MCH: 25 pg — AB (ref 26.0–34.0)
MCH: 24.9 pg — AB (ref 26.0–34.0)
MCHC: 30 g/dL (ref 30.0–36.0)
MCHC: 30.3 g/dL (ref 30.0–36.0)
MCV: 83.3 fL (ref 78.0–100.0)
MCV: 82.2 fL (ref 78.0–100.0)
PLATELETS: 404 10*3/uL — AB (ref 150–400)
Platelets: 333 10*3/uL (ref 150–400)
RBC: 4.72 MIL/uL (ref 4.22–5.81)
RBC: 3.98 MIL/uL — AB (ref 4.22–5.81)
RDW: 19.9 % — AB (ref 11.5–15.5)
RDW: 19.1 % — ABNORMAL HIGH (ref 11.5–15.5)
WBC: 19.5 10*3/uL — ABNORMAL HIGH (ref 4.0–10.5)
WBC: 15.1 10*3/uL — ABNORMAL HIGH (ref 4.0–10.5)

## 2017-03-01 LAB — I-STAT CG4 LACTIC ACID, ED: LACTIC ACID, VENOUS: 3.11 mmol/L — AB (ref 0.5–1.9)

## 2017-03-01 LAB — ETHANOL

## 2017-03-01 LAB — HEPATIC FUNCTION PANEL
ALT: 31 U/L (ref 17–63)
AST: 78 U/L — AB (ref 15–41)
Albumin: 1.7 g/dL — ABNORMAL LOW (ref 3.5–5.0)
Alkaline Phosphatase: 629 U/L — ABNORMAL HIGH (ref 38–126)
BILIRUBIN DIRECT: 1 mg/dL — AB (ref 0.1–0.5)
BILIRUBIN INDIRECT: 1.2 mg/dL — AB (ref 0.3–0.9)
Total Bilirubin: 2.2 mg/dL — ABNORMAL HIGH (ref 0.3–1.2)
Total Protein: 6.1 g/dL — ABNORMAL LOW (ref 6.5–8.1)

## 2017-03-01 LAB — I-STAT CHEM 8, ED
CALCIUM ION: 0.93 mmol/L — AB (ref 1.15–1.40)
CREATININE: 3.9 mg/dL — AB (ref 0.61–1.24)
Chloride: 115 mmol/L — ABNORMAL HIGH (ref 101–111)
Glucose, Bld: 292 mg/dL — ABNORMAL HIGH (ref 65–99)
HCT: 40 % (ref 39.0–52.0)
HEMOGLOBIN: 13.6 g/dL (ref 13.0–17.0)
Potassium: 6.5 mmol/L (ref 3.5–5.1)
Sodium: 155 mmol/L — ABNORMAL HIGH (ref 135–145)
TCO2: 26 mmol/L (ref 22–32)

## 2017-03-01 LAB — BPAM FFP
Blood Product Expiration Date: 201811032359
Blood Product Expiration Date: 201811032359
ISSUE DATE / TIME: 201810191010
ISSUE DATE / TIME: 201810191010
UNIT TYPE AND RH: 6200
Unit Type and Rh: 600

## 2017-03-01 LAB — AMMONIA: AMMONIA: 77 umol/L — AB (ref 9–35)

## 2017-03-01 LAB — LACTIC ACID, PLASMA: LACTIC ACID, VENOUS: 2.8 mmol/L — AB (ref 0.5–1.9)

## 2017-03-01 LAB — TROPONIN I
TROPONIN I: 0.17 ng/mL — AB (ref <0.03)
TROPONIN I: 0.19 ng/mL — AB (ref <0.03)

## 2017-03-01 LAB — CK: Total CK: 350 U/L (ref 49–397)

## 2017-03-01 LAB — CREATININE, SERUM
CREATININE: 3.75 mg/dL — AB (ref 0.61–1.24)
GFR calc non Af Amer: 15 mL/min — ABNORMAL LOW (ref >60)
GFR, EST AFRICAN AMERICAN: 17 mL/min — AB (ref >60)

## 2017-03-01 LAB — PROTIME-INR
INR: 1.5
Prothrombin Time: 18 seconds — ABNORMAL HIGH (ref 11.4–15.2)

## 2017-03-01 MED ORDER — ENOXAPARIN SODIUM 40 MG/0.4ML ~~LOC~~ SOLN
40.0000 mg | SUBCUTANEOUS | Status: DC
  Administered 2017-03-01: 40 mg via SUBCUTANEOUS
  Filled 2017-03-01: qty 0.4

## 2017-03-01 MED ORDER — VANCOMYCIN HCL IN DEXTROSE 1-5 GM/200ML-% IV SOLN: 1000.0000 mg | Freq: Once | INTRAVENOUS | Status: DC

## 2017-03-01 MED ORDER — DOPAMINE-DEXTROSE 3.2-5 MG/ML-% IV SOLN
0.0000 ug/kg/min | INTRAVENOUS | Status: DC
  Administered 2017-03-01: 2 ug/kg/min via INTRAVENOUS
  Filled 2017-03-01: qty 250

## 2017-03-01 MED ORDER — ROCURONIUM BROMIDE 50 MG/5ML IV SOLN
INTRAVENOUS | Status: AC | PRN
  Administered 2017-03-01: 100 mg via INTRAVENOUS

## 2017-03-01 MED ORDER — SODIUM CHLORIDE 0.9 % IV SOLN
INTRAVENOUS | Status: DC | PRN
  Administered 2017-03-01: 1000 mL via INTRAVENOUS

## 2017-03-01 MED ORDER — PANTOPRAZOLE SODIUM 40 MG IV SOLR
40.0000 mg | Freq: Every day | INTRAVENOUS | Status: DC
  Administered 2017-03-01 – 2017-03-04 (×4): 40 mg via INTRAVENOUS
  Filled 2017-03-01 (×4): qty 40

## 2017-03-01 MED ORDER — VANCOMYCIN HCL 10 G IV SOLR
1500.0000 mg | Freq: Once | INTRAVENOUS | Status: AC
  Administered 2017-03-01: 1500 mg via INTRAVENOUS
  Filled 2017-03-01: qty 1500

## 2017-03-01 MED ORDER — PANTOPRAZOLE SODIUM 40 MG PO TBEC: 40.0000 mg | DELAYED_RELEASE_TABLET | Freq: Every day | ORAL | Status: DC

## 2017-03-01 MED ORDER — INSULIN ASPART 100 UNIT/ML ~~LOC~~ SOLN
SUBCUTANEOUS | Status: AC | PRN
  Administered 2017-03-01: 20 [IU] via INTRAVENOUS

## 2017-03-01 MED ORDER — ORAL CARE MOUTH RINSE
15.0000 mL | OROMUCOSAL | Status: DC
  Administered 2017-03-01 – 2017-03-02 (×8): 15 mL via OROMUCOSAL

## 2017-03-01 MED ORDER — ETOMIDATE 2 MG/ML IV SOLN
INTRAVENOUS | Status: AC | PRN
  Administered 2017-03-01: 10 mg via INTRAVENOUS

## 2017-03-01 MED ORDER — PIPERACILLIN-TAZOBACTAM 3.375 G IVPB 30 MIN: 3.3750 g | Freq: Once | INTRAVENOUS | Status: DC

## 2017-03-01 MED ORDER — DEXTROSE 50 % IV SOLN
INTRAVENOUS | Status: AC | PRN
  Administered 2017-03-01: 25 mL via INTRAVENOUS

## 2017-03-01 MED ORDER — ORAL CARE MOUTH RINSE
15.0000 mL | Freq: Four times a day (QID) | OROMUCOSAL | Status: DC
  Administered 2017-03-01: 15 mL via OROMUCOSAL

## 2017-03-01 MED ORDER — CHLORHEXIDINE GLUCONATE 0.12% ORAL RINSE (MEDLINE KIT): 15.0000 mL | Freq: Two times a day (BID) | OROMUCOSAL | Status: DC

## 2017-03-01 MED ORDER — CHLORHEXIDINE GLUCONATE 0.12% ORAL RINSE (MEDLINE KIT)
15.0000 mL | Freq: Two times a day (BID) | OROMUCOSAL | Status: DC
  Administered 2017-03-01 – 2017-03-02 (×2): 15 mL via OROMUCOSAL

## 2017-03-01 MED ORDER — CALCIUM CHLORIDE 10 % IV SOLN
INTRAVENOUS | Status: AC | PRN
  Administered 2017-03-01: 1 g via INTRAVENOUS

## 2017-03-01 MED ORDER — PIPERACILLIN-TAZOBACTAM IN DEX 2-0.25 GM/50ML IV SOLN
2.2500 g | Freq: Three times a day (TID) | INTRAVENOUS | Status: DC
  Administered 2017-03-01 – 2017-03-04 (×9): 2.25 g via INTRAVENOUS
  Filled 2017-03-01 (×10): qty 50

## 2017-03-01 MED ORDER — DEXTROSE 5 % IV SOLN
0.0000 ug/min | INTRAVENOUS | Status: DC
  Administered 2017-03-01: 10 ug/min via INTRAVENOUS
  Administered 2017-03-01: 2 ug/min via INTRAVENOUS
  Administered 2017-03-02: 18 ug/min via INTRAVENOUS
  Administered 2017-03-02: 14 ug/min via INTRAVENOUS
  Administered 2017-03-02: 20 ug/min via INTRAVENOUS
  Administered 2017-03-03: 12 ug/min via INTRAVENOUS
  Administered 2017-03-03: 8 ug/min via INTRAVENOUS
  Administered 2017-03-03 (×2): 16 ug/min via INTRAVENOUS
  Administered 2017-03-04: 8 ug/min via INTRAVENOUS
  Filled 2017-03-01 (×13): qty 4

## 2017-03-01 MED ORDER — SODIUM CHLORIDE 0.45 % IV SOLN
INTRAVENOUS | Status: DC
  Administered 2017-03-01 – 2017-03-02 (×3): via INTRAVENOUS

## 2017-03-01 MED ORDER — DEXTROSE 50 % IV SOLN
INTRAVENOUS | Status: AC
  Filled 2017-03-01: qty 50

## 2017-03-01 MED ORDER — SODIUM CHLORIDE 0.9 % IV SOLN: INTRAVENOUS | Status: DC

## 2017-03-01 MED ORDER — INSULIN ASPART 100 UNIT/ML ~~LOC~~ SOLN
SUBCUTANEOUS | Status: AC
  Filled 2017-03-01: qty 1

## 2017-03-01 NOTE — Progress Notes (Signed)
Vent changes made per Dr. Richarda Blade order.

## 2017-03-01 NOTE — ED Notes (Signed)
Cardiology at bedside and aware of heart rate remaining low and blood pressure reading in the 83'A systolic
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
## 2017-03-01 NOTE — ED Notes (Signed)
Dr ray attempting intubation at this time.
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
## 2017-03-01 NOTE — Procedures (Signed)
Arterial Catheter Insertion Procedure Note Eliane Decree [id number] 05/14/1874  Procedure: Insertion of Arterial Catheter  Indications: Blood pressure monitoring and Frequent blood sampling  Procedure Details Consent: Unable to obtain consent because of emergent medical necessity. Time Out: Verified patient identification, verified procedure, site/side was marked, verified correct patient position, special equipment/implants available, medications/allergies/relevent history reviewed, required imaging and test results available.  Performed  Maximum sterile technique was used including antiseptics, cap, gloves, gown, hand hygiene, mask and sheet. Skin prep: Chlorhexidine; local anesthetic administered 20 gauge catheter was inserted into left radial artery using the Seldinger technique.  Evaluation Blood flow good; BP tracing good. Complications: No apparent complications.    Dimple Nanas 03/12/2017

## 2017-03-01 NOTE — ED Notes (Signed)
This rn remains at bedside
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
## 2017-03-01 NOTE — ED Notes (Signed)
c collar placed

## 2017-03-01 NOTE — ED Notes (Addendum)
Per lab repeat hgb has dropped to 9.  Dr. Jeanell Sparrow notified, no further orders.  Dr. Hulen Skains paged.
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
## 2017-03-01 NOTE — Consult Note (Signed)
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 03/10/2017 CLINICAL DATA:  Unresponsive. EXAM: PORTABLE CHEST 1 VIEW COMPARISON:  No comparison studies available. FINDINGS: Endotracheal tube tip about 2.5 cm above the base of the carina. Asymmetric elevation right hemidiaphragm. Left lung clear. Reticular opacity noted central right lung.The cardiopericardial silhouette is within normal limits for size. Prominence of the upper mediastinum may be due to the patient positioning. The visualized bony structures of the thorax are intact. Telemetry leads overlie the chest. IMPRESSION: Central reticulation in the right lung. Nonspecific but aspiration would be a consideration. Atypical infection is also possibility. Electronically Signed   By: Misty Stanley M.D.   On: 03/04/2017 11:07    Assessment and Plan:   1. Sinus bradycardia - unclear cause, due to pt condition, do not think he has had any meds that could be responsible - However, nothing is known about possible home meds. - his BP has been low, supplemented w/ IVF but keeps dropping - add dopamine, use lowest dose that will keep HR in the 50s - ck echo, ck LFTs  2. Hypotension - BP responds to fluids, would use those primarily - supplement fluid w/ DA     Active Problems: 3.  Acute renal failure (ARF) (HCC) - I stat initially showed BUN >140 and Cr 3.9 - after some hydration, BUN accurately measured at 205 and Cr at 3.75 - surprisingly CK is only 350 (?2nd minimal muscle mass) - continue hydration per attending  4.  Unresponsive - not requiring sedation on the vent. - per attending MD, consider CCM consult.  **Pt marked for merge, name and address are the same.  Hx of hypertension, uncontrolled diabetes, thrombocytosis, elevated liver enzymes, hyperlipidemia, obesity, ETOH use, Hep C ab+ **Upon chart review, at office visit w/ [facility] 07/2016, he weighed 213 lbs  Signed, Rosaria Ferries, PA-C  02/13/2017 1:38 PM

## 2017-03-01 NOTE — Progress Notes (Signed)
MD paged regarding pt having seizure like activity, no orders placed at this time.
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
## 2017-03-01 NOTE — ED Notes (Signed)
Successful intubation by dr ray. 7.0 ETT at 24 at the teeth, breath sounds diminished on right side

## 2017-03-02 ENCOUNTER — Inpatient Hospital Stay (HOSPITAL_COMMUNITY): Payer: Medicare Other

## 2017-03-02 DIAGNOSIS — L8990 Pressure ulcer of unspecified site, unspecified stage: Secondary | ICD-10-CM | POA: Insufficient documentation

## 2017-03-02 DIAGNOSIS — I248 Other forms of acute ischemic heart disease: Secondary | ICD-10-CM

## 2017-03-02 DIAGNOSIS — A419 Sepsis, unspecified organism: Principal | ICD-10-CM

## 2017-03-02 DIAGNOSIS — J9601 Acute respiratory failure with hypoxia: Secondary | ICD-10-CM

## 2017-03-02 LAB — URINALYSIS, ROUTINE W REFLEX MICROSCOPIC
BILIRUBIN URINE: NEGATIVE
Glucose, UA: NEGATIVE mg/dL
Ketones, ur: NEGATIVE mg/dL
Leukocytes, UA: NEGATIVE
Nitrite: NEGATIVE
Protein, ur: NEGATIVE mg/dL
SPECIFIC GRAVITY, URINE: 1.016 (ref 1.005–1.030)
SQUAMOUS EPITHELIAL / LPF: NONE SEEN
pH: 5 (ref 5.0–8.0)

## 2017-03-02 LAB — BLOOD CULTURE ID PANEL (REFLEXED)
Acinetobacter baumannii: NOT DETECTED
CANDIDA ALBICANS: NOT DETECTED
CANDIDA GLABRATA: NOT DETECTED
CANDIDA KRUSEI: NOT DETECTED
CANDIDA TROPICALIS: NOT DETECTED
Candida parapsilosis: NOT DETECTED
ENTEROBACTER CLOACAE COMPLEX: NOT DETECTED
ENTEROBACTERIACEAE SPECIES: NOT DETECTED
ENTEROCOCCUS SPECIES: NOT DETECTED
ESCHERICHIA COLI: NOT DETECTED
Haemophilus influenzae: NOT DETECTED
KLEBSIELLA PNEUMONIAE: NOT DETECTED
Klebsiella oxytoca: NOT DETECTED
Listeria monocytogenes: NOT DETECTED
Methicillin resistance: NOT DETECTED
NEISSERIA MENINGITIDIS: NOT DETECTED
Proteus species: NOT DETECTED
Pseudomonas aeruginosa: NOT DETECTED
STREPTOCOCCUS AGALACTIAE: NOT DETECTED
STREPTOCOCCUS PYOGENES: NOT DETECTED
STREPTOCOCCUS SPECIES: NOT DETECTED
Serratia marcescens: NOT DETECTED
Staphylococcus aureus (BCID): NOT DETECTED
Staphylococcus species: DETECTED — AB
Streptococcus pneumoniae: NOT DETECTED

## 2017-03-02 LAB — TROPONIN I
TROPONIN I: 0.85 ng/mL — AB (ref <0.03)
TROPONIN I: 0.86 ng/mL — AB (ref <0.03)
Troponin I: 0.76 ng/mL (ref <0.03)

## 2017-03-02 LAB — BASIC METABOLIC PANEL
ANION GAP: 14 (ref 5–15)
BUN: 183 mg/dL — ABNORMAL HIGH (ref 6–20)
CHLORIDE: 112 mmol/L — AB (ref 101–111)
CO2: 17 mmol/L — AB (ref 22–32)
CREATININE: 3.92 mg/dL — AB (ref 0.61–1.24)
Calcium: 7 mg/dL — ABNORMAL LOW (ref 8.9–10.3)
GFR calc non Af Amer: 14 mL/min — ABNORMAL LOW (ref >60)
GFR, EST AFRICAN AMERICAN: 16 mL/min — AB (ref >60)
GLUCOSE: 216 mg/dL — AB (ref 65–99)
Potassium: 4.9 mmol/L (ref 3.5–5.1)
Sodium: 143 mmol/L (ref 135–145)

## 2017-03-02 LAB — GLUCOSE, CAPILLARY
GLUCOSE-CAPILLARY: 249 mg/dL — AB (ref 65–99)
Glucose-Capillary: 218 mg/dL — ABNORMAL HIGH (ref 65–99)
Glucose-Capillary: 231 mg/dL — ABNORMAL HIGH (ref 65–99)
Glucose-Capillary: 232 mg/dL — ABNORMAL HIGH (ref 65–99)
Glucose-Capillary: 258 mg/dL — ABNORMAL HIGH (ref 65–99)
Glucose-Capillary: 175 mg/dL — ABNORMAL HIGH (ref 65–99)
Glucose-Capillary: 185 mg/dL — ABNORMAL HIGH (ref 65–99)

## 2017-03-02 LAB — COMPREHENSIVE METABOLIC PANEL
ALK PHOS: 889 U/L — AB (ref 38–126)
ALT: 55 U/L (ref 17–63)
AST: 254 U/L — ABNORMAL HIGH (ref 15–41)
Albumin: 1.7 g/dL — ABNORMAL LOW (ref 3.5–5.0)
Anion gap: 17 — ABNORMAL HIGH (ref 5–15)
BUN: 194 mg/dL — AB (ref 6–20)
CALCIUM: 7.5 mg/dL — AB (ref 8.9–10.3)
CO2: 16 mmol/L — ABNORMAL LOW (ref 22–32)
CREATININE: 4.07 mg/dL — AB (ref 0.61–1.24)
Chloride: 112 mmol/L — ABNORMAL HIGH (ref 101–111)
GFR, EST AFRICAN AMERICAN: 16 mL/min — AB (ref >60)
GFR, EST NON AFRICAN AMERICAN: 14 mL/min — AB (ref >60)
Glucose, Bld: 283 mg/dL — ABNORMAL HIGH (ref 65–99)
Potassium: 5.9 mmol/L — ABNORMAL HIGH (ref 3.5–5.1)
Sodium: 145 mmol/L (ref 135–145)
Total Bilirubin: 2.3 mg/dL — ABNORMAL HIGH (ref 0.3–1.2)
Total Protein: 6.5 g/dL (ref 6.5–8.1)

## 2017-03-02 LAB — PROTIME-INR
INR: 1.9
PROTHROMBIN TIME: 21.6 s — AB (ref 11.4–15.2)

## 2017-03-02 LAB — CBC
HCT: 31.4 % — ABNORMAL LOW (ref 39.0–52.0)
HEMATOCRIT: 35 % — AB (ref 39.0–52.0)
HEMOGLOBIN: 11 g/dL — AB (ref 13.0–17.0)
Hemoglobin: 9.5 g/dL — ABNORMAL LOW (ref 13.0–17.0)
MCH: 24.6 pg — AB (ref 26.0–34.0)
MCH: 24.1 pg — AB (ref 26.0–34.0)
MCHC: 31.4 g/dL (ref 30.0–36.0)
MCHC: 30.3 g/dL (ref 30.0–36.0)
MCV: 78.3 fL (ref 78.0–100.0)
MCV: 79.5 fL (ref 78.0–100.0)
PLATELETS: 407 10*3/uL — AB (ref 150–400)
Platelets: 534 10*3/uL — ABNORMAL HIGH (ref 150–400)
RBC: 4.47 MIL/uL (ref 4.22–5.81)
RBC: 3.95 MIL/uL — AB (ref 4.22–5.81)
RDW: 19.5 % — ABNORMAL HIGH (ref 11.5–15.5)
RDW: 19.6 % — AB (ref 11.5–15.5)
WBC: 19.4 10*3/uL — ABNORMAL HIGH (ref 4.0–10.5)
WBC: 19.2 10*3/uL — ABNORMAL HIGH (ref 4.0–10.5)

## 2017-03-02 LAB — LACTIC ACID, PLASMA
LACTIC ACID, VENOUS: 4.1 mmol/L — AB (ref 0.5–1.9)
LACTIC ACID, VENOUS: 3.3 mmol/L — AB (ref 0.5–1.9)

## 2017-03-02 LAB — POCT I-STAT 3, ART BLOOD GAS (G3+)
Acid-base deficit: 2 mmol/L (ref 0.0–2.0)
BICARBONATE: 21.2 mmol/L (ref 20.0–28.0)
O2 Saturation: 100 %
PCO2 ART: 31.9 mmHg — AB (ref 32.0–48.0)
PO2 ART: 217 mmHg — AB (ref 83.0–108.0)
TCO2: 22 mmol/L (ref 22–32)
pH, Arterial: 7.433 (ref 7.350–7.450)

## 2017-03-02 LAB — PHOSPHORUS: PHOSPHORUS: 5.5 mg/dL — AB (ref 2.5–4.6)

## 2017-03-02 LAB — PROCALCITONIN: PROCALCITONIN: 8.47 ng/mL

## 2017-03-02 LAB — AMMONIA: AMMONIA: 76 umol/L — AB (ref 9–35)

## 2017-03-02 LAB — TSH: TSH: 4.238 u[IU]/mL (ref 0.350–4.500)

## 2017-03-02 LAB — SODIUM, URINE, RANDOM

## 2017-03-02 LAB — CREATININE, URINE, RANDOM: CREATININE, URINE: 91.55 mg/dL

## 2017-03-02 LAB — MAGNESIUM: Magnesium: 3.4 mg/dL — ABNORMAL HIGH (ref 1.7–2.4)

## 2017-03-02 LAB — CK: Total CK: 715 U/L — ABNORMAL HIGH (ref 49–397)

## 2017-03-02 LAB — LIPASE, BLOOD: Lipase: 158 U/L — ABNORMAL HIGH (ref 11–51)

## 2017-03-02 MED ORDER — SILVER SULFADIAZINE 1 % EX CREA
TOPICAL_CREAM | Freq: Two times a day (BID) | CUTANEOUS | Status: DC
  Administered 2017-03-02: 13:00:00 via TOPICAL
  Administered 2017-03-03: 1 via TOPICAL
  Administered 2017-03-03 – 2017-03-04 (×4): via TOPICAL
  Administered 2017-03-05: 1 via TOPICAL
  Administered 2017-03-05 – 2017-03-07 (×3): via TOPICAL
  Administered 2017-03-07: 1 via TOPICAL
  Administered 2017-03-07 – 2017-03-10 (×5): via TOPICAL
  Administered 2017-03-10: 1 via TOPICAL
  Administered 2017-03-10 – 2017-03-12 (×4): via TOPICAL
  Administered 2017-03-12 – 2017-03-13 (×2): 1 via TOPICAL
  Filled 2017-03-02 (×3): qty 85

## 2017-03-02 MED ORDER — VANCOMYCIN HCL IN DEXTROSE 1-5 GM/200ML-% IV SOLN: 1000.0000 mg | Freq: Once | INTRAVENOUS | Status: DC

## 2017-03-02 MED ORDER — CHLORHEXIDINE GLUCONATE 0.12% ORAL RINSE (MEDLINE KIT)
15.0000 mL | Freq: Two times a day (BID) | OROMUCOSAL | Status: DC
  Administered 2017-03-02 – 2017-03-13 (×22): 15 mL via OROMUCOSAL

## 2017-03-02 MED ORDER — ALBUTEROL SULFATE (2.5 MG/3ML) 0.083% IN NEBU: 2.5000 mg | INHALATION_SOLUTION | RESPIRATORY_TRACT | Status: DC | PRN

## 2017-03-02 MED ORDER — SODIUM BICARBONATE 8.4 % IV SOLN
INTRAVENOUS | Status: DC
  Administered 2017-03-02 – 2017-03-04 (×6): via INTRAVENOUS
  Filled 2017-03-02 (×13): qty 75

## 2017-03-02 MED ORDER — FENTANYL 2500MCG IN NS 250ML (10MCG/ML) PREMIX INFUSION
25.0000 ug/h | INTRAVENOUS | Status: DC
  Filled 2017-03-02: qty 250

## 2017-03-02 MED ORDER — LACTULOSE 10 GM/15ML PO SOLN: 30.0000 g | Freq: Three times a day (TID) | ORAL | Status: DC | PRN

## 2017-03-02 MED ORDER — FAMOTIDINE IN NACL 20-0.9 MG/50ML-% IV SOLN
20.0000 mg | Freq: Two times a day (BID) | INTRAVENOUS | Status: DC
Start: 2017-03-02 — End: 2017-03-02

## 2017-03-02 MED ORDER — FENTANYL BOLUS VIA INFUSION: 25.0000 ug | INTRAVENOUS | Status: DC | PRN

## 2017-03-02 MED ORDER — DOCUSATE SODIUM 50 MG/5ML PO LIQD: 100.0000 mg | Freq: Two times a day (BID) | ORAL | Status: DC | PRN

## 2017-03-02 MED ORDER — DOCUSATE SODIUM 50 MG/5ML PO LIQD
100.0000 mg | Freq: Two times a day (BID) | ORAL | Status: DC
  Administered 2017-03-02 – 2017-03-03 (×3): 100 mg
  Filled 2017-03-02 (×3): qty 10

## 2017-03-02 MED ORDER — LORAZEPAM 2 MG/ML IJ SOLN
INTRAMUSCULAR | Status: AC
  Filled 2017-03-02: qty 1

## 2017-03-02 MED ORDER — FOLIC ACID 5 MG/ML IJ SOLN
1.0000 mg | Freq: Every day | INTRAMUSCULAR | Status: DC
  Administered 2017-03-02 – 2017-03-03 (×2): 1 mg via INTRAVENOUS
  Filled 2017-03-02 (×4): qty 0.2

## 2017-03-02 MED ORDER — THIAMINE HCL 100 MG/ML IJ SOLN
500.0000 mg | Freq: Every day | INTRAVENOUS | Status: DC
  Administered 2017-03-02 – 2017-03-04 (×3): 500 mg via INTRAVENOUS
  Filled 2017-03-02 (×3): qty 5

## 2017-03-02 MED ORDER — MIDAZOLAM HCL 2 MG/2ML IJ SOLN: 1.0000 mg | INTRAMUSCULAR | Status: DC | PRN

## 2017-03-02 MED ORDER — SODIUM CHLORIDE 0.9 % IV SOLN
500.0000 mg | Freq: Two times a day (BID) | INTRAVENOUS | Status: DC
  Administered 2017-03-02 – 2017-03-13 (×23): 500 mg via INTRAVENOUS
  Filled 2017-03-02 (×24): qty 5

## 2017-03-02 MED ORDER — SODIUM CHLORIDE 0.9 % IV BOLUS (SEPSIS)
1000.0000 mL | Freq: Once | INTRAVENOUS | Status: AC
  Administered 2017-03-02: 1000 mL via INTRAVENOUS

## 2017-03-02 MED ORDER — CLONAZEPAM 0.1 MG/ML ORAL SUSPENSION
1.0000 mg | Freq: Four times a day (QID) | ORAL | Status: DC | PRN
  Filled 2017-03-02: qty 10

## 2017-03-02 MED ORDER — FENTANYL CITRATE (PF) 100 MCG/2ML IJ SOLN
50.0000 ug | Freq: Once | INTRAMUSCULAR | Status: DC
  Filled 2017-03-02: qty 2

## 2017-03-02 MED ORDER — CLONAZEPAM 0.5 MG PO TBDP: 1.0000 mg | ORAL_TABLET | Freq: Four times a day (QID) | ORAL | Status: DC | PRN

## 2017-03-02 MED ORDER — ADULT MULTIVITAMIN LIQUID CH
15.0000 mL | Freq: Every day | ORAL | Status: DC
  Administered 2017-03-02 – 2017-03-04 (×3): 15 mL via ORAL
  Filled 2017-03-02 (×3): qty 15

## 2017-03-02 MED ORDER — INSULIN ASPART 100 UNIT/ML ~~LOC~~ SOLN: 2.0000 [IU] | SUBCUTANEOUS | Status: DC

## 2017-03-02 MED ORDER — INSULIN ASPART 100 UNIT/ML ~~LOC~~ SOLN
0.0000 [IU] | SUBCUTANEOUS | Status: DC
  Administered 2017-03-02 (×2): 3 [IU] via SUBCUTANEOUS
  Administered 2017-03-02: 8 [IU] via SUBCUTANEOUS
  Administered 2017-03-02 – 2017-03-03 (×2): 5 [IU] via SUBCUTANEOUS
  Administered 2017-03-03: 8 [IU] via SUBCUTANEOUS

## 2017-03-02 MED ORDER — FENTANYL BOLUS VIA INFUSION
50.0000 ug | INTRAVENOUS | Status: DC | PRN
  Filled 2017-03-02: qty 50

## 2017-03-02 MED ORDER — LORAZEPAM 2 MG/ML IJ SOLN
2.0000 mg | INTRAMUSCULAR | Status: DC | PRN
  Administered 2017-03-02: 2 mg via INTRAVENOUS

## 2017-03-02 MED ORDER — ORAL CARE MOUTH RINSE
15.0000 mL | Freq: Four times a day (QID) | OROMUCOSAL | Status: DC
  Administered 2017-03-02 – 2017-03-06 (×15): 15 mL via OROMUCOSAL

## 2017-03-02 MED ORDER — HEPARIN SODIUM (PORCINE) 5000 UNIT/ML IJ SOLN
5000.0000 [IU] | Freq: Three times a day (TID) | INTRAMUSCULAR | Status: DC
  Administered 2017-03-02 – 2017-03-04 (×5): 5000 [IU] via SUBCUTANEOUS
  Filled 2017-03-02 (×5): qty 1

## 2017-03-02 NOTE — Procedures (Signed)
Central Venous Catheter Insertion Procedure Note Dennis [name] [id number] 05/17/1945  Procedure: Insertion of Central Venous Catheter Indications: Assessment of intravascular volume, Drug and/or fluid administration and Frequent blood sampling  Procedure Details Consent: Unable to obtain consent because of emergent medical necessity. Time Out: Verified patient identification, verified procedure, site/side was marked, verified correct patient position, special equipment/implants available, medications/allergies/relevent history reviewed, required imaging and test results available. Time out was performed.   Maximum sterile technique was used including antiseptics, cap, gloves, gown, hand hygiene, mask and sheet. Skin prep: Chlorhexidine; local anesthetic administered A antimicrobial bonded/coated triple lumen catheter was placed in the RIJ using the Seldinger technique.  Evaluation Blood flow good Complications: No apparent complications Patient did tolerate procedure well. Chest X-ray ordered to verify placement.  CXR: pending.  Dennis [name] 03/02/2017, 4:58 PM

## 2017-03-02 NOTE — Consult Note (Addendum)
Renal Service Consult Note [facility]  Dennis [name] 03/02/2017 [place] D Requesting Physician:  Dr Jimmey Ralph, CCM  Reason for Consult:  Renal failure HPI: The patient is a 71 y.o. year-old found by a bystander at home, possibly laying there for 3 days.  Per ED reports pt was found agonal respirations on his bed, laying on his R side with blisters over the R side of his body.  He was unresponsive.  He was felt to possibly be a trauma case, got body CT in the ED results of which are noted below.  Trauma decided case would be better managed by CCM when they realized he didn't really have much trauma.    Making small amts of urine via foley cath.  CXR clear.  Pt not able to answer any questions. ROS abandoned. NO family is here either    CT abd/ pelv/ chest showed >  IMPRESSION: 1. Subjective bowel wall thickening throughout the mid small bowel. This is of uncertain etiology and significance. In the setting of trauma, acute post traumatic intramural hemorrhage is not excluded. Other differential considerations include bowel wall inflammation or edema. 2. No other overt signs to suggest significant acute traumatic injury elsewhere in the chest, abdomen or pelvis. 3. Probable transient intussusception of the distal descending colon. The possibility of a mass or polyp serving as a lead point Is not excluded, and follow-up nonemergent colonoscopy after resolution of the patient's acute illness is suggested in the near future to better evaluate these findings. 4. Heterogeneous appearance of the liver. In addition underlying cirrhosis, there is probable diffuse heterogeneous hepatic steatosis. However, the possibility of an infiltrative process including metastatic neoplastic disease is not excluded, and follow-up nonemergent MRI of the abdomen with and without IV gadolinium is recommended in the near future after resolution of the patient's acute illness. 5. Subtle inflammatory  changes around the pancreas. This could simply be related to underlying edema, however, the possibility of pancreatitis should be considered. 6. Right common femoral central catheter appears displaced with the tip either completely or partially extraluminal, as discussed above. Clinical correlation is recommended. 7. Aortic atherosclerosis, in addition to right coronary artery disease. Please note that although the presence of coronary artery calcium documents the presence of coronary artery disease, the severity of this disease and any potential stenosis cannot be assessed on this non-gated CT examination. Assessment for potential risk factor modification, dietary therapy or pharmacologic therapy may be warranted, if clinically indicated. 8. There are calcifications of the aortic valve. Echocardiographic correlation for evaluation of potential valvular dysfunction may be warranted if clinically indicated.    Past Medical History  Past Medical History:  Diagnosis Date  . 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 Family History No family history on file. Social History  has no tobacco, alcohol, and drug history on file. Allergies No Known Allergies Home medications Prior to Admission medications   Medication Sig Start Date End Date Taking? Authorizing Provider  insulin NPH-regular Human (NOVOLIN 70/30) (70-30) 100 UNIT/ML injection Inject 18-22 Units into the skin See admin instructions. 22 units in the morning and 18 units in the evening    [provider]  lisinopril (PRINIVIL,ZESTRIL) 20 MG tablet Take 20 mg by mouth daily.    [provider]  metFORMIN (GLUCOPHAGE) 500 MG tablet Take 500 mg by mouth daily with breakfast.    [provider]  pantoprazole (PROTONIX) 40 MG tablet Take 40 mg by  mouth daily.    [provider]   Liver Function Tests  Recent Labs Lab 03/11/2017 1218 03/02/17 0550  AST 78* 254*   ALT 31 55  ALKPHOS 629* 889*  BILITOT 2.2* 2.3*  PROT 6.1* 6.5  ALBUMIN 1.7* 1.7*    Recent Labs Lab 03/02/17 1445  LIPASE 158*   CBC  Recent Labs Lab 03/12/2017 1209 03/02/17 0550 03/02/17 1445  WBC 15.1* 19.4* 19.2*  HGB DELTA CHECK NOTED 11.0* 9.5*  HCT 32.7* 35.0* 31.4*  MCV 82.2 78.3 79.5  PLT 333 534* 725*   Basic Metabolic Panel  Recent Labs Lab 02/18/2017 1037 02/28/2017 1209 03/02/17 0550 03/02/17 1445  NA 155* 155* 145 143  K 6.5* 4.6 5.9* 4.9  CL 115* 119* 112* 112*  CO2  --  19* 16* 17*  GLUCOSE 292* 271* 283* 216*  BUN >140* 205* 194* 183*  CREATININE 3.90* 3.74*  3.75* 4.07* 3.92*  CALCIUM  --  8.1* 7.5* 7.0*  PHOS  --   --   --  5.5*   Iron/TIBC/Ferritin/ %Sat No results found for: IRON, TIBC, FERRITIN, IRONPCTSAT  Vitals:   03/02/17 0755 03/02/17 0800 03/02/17 0900 03/02/17 1202  BP: 126/68 129/61 (!) 117/57   Pulse: 92 94 83 71  Resp: 15 (!) 29 (!) 28 18  Temp:  99 F (37.2 C) 99.3 F (37.4 C)   TempSrc:  Other (Comment)    SpO2: 100% 97% 95% 100%  Weight:      Height:       Exam Gen unresponsive to voice/ palpation, on vent No rash, cyanosis or gangrene Sclera anicteric, throat w ETT  Neck not examined, on C-collar Chest clear bilat ant and lat RRR no MRG Abd soft ntnd no mass or ascites +bs scaphoid GU normal male w foley draining some small amts clear yellow urine MS no joint effusions or deformity Ext trace bilat LE edema, LUE edema 1-2+, no RUE edema / no wounds or ulcers Neuro is comatose, not responding on vent, on some sedation   Na 143  K 4.9   CO2 17  BUN 183  Cr 3.92   CA 7  Alb 1.7   AST 254/ ALT 55  TProt   6.5   NH3 76  Tbili 2.3    eGFR 15  CPK 715  Trop 0.86 ABG 7.43/ 32/ 217 CXR - clear, normal heart, normal lungs WBC 19k  Hb 9.5  plt 407     Impression: 1. Renal failure - in pt found down at home, hypothermic, not sure how long he was down.  Creat 4.  Suspect AKI from severe dehydration and shock.   Doubt  MS is from uremia alone with creat of 4.  Would recommend medical Rx, give IVF"s, dc Vanc and hopefully will start making more urine.  Will follow.   2. Shock/ hypothermia - on pressors 3. AMS - has cirrhosis by imaging, and ^^ NH3 in the 70's.  Uremia could be playing a role, not sure.   4. Cirrhosis - by imaging 5. Volume - I think he has room for volume.  Some edema of the arms > leg may represent 3rd spacing.    Plan - change IVF to bicarb gtt at 125/ hr.  Watch daily volume status, UOP , etc..  I think he is more intravasc dry w/ some extremity edema from 3rd spacing.   Kelly Splinter MD [employer] pager [phone]   03/02/2017, 3:54 PM
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
## 2017-03-02 NOTE — Progress Notes (Signed)
EEG completed, results pending. 

## 2017-03-02 NOTE — Progress Notes (Signed)
PHARMACY - PHYSICIAN COMMUNICATION CRITICAL VALUE ALERT - BLOOD CULTURE IDENTIFICATION (BCID)  Results for orders placed or performed during the hospital encounter of 03/06/2017  Blood Culture ID Panel (Reflexed) (Collected: 02/16/2017 12:04 PM)  Result Value Ref Range   Enterococcus species NOT DETECTED NOT DETECTED   Listeria monocytogenes NOT DETECTED NOT DETECTED   Staphylococcus species DETECTED (A) NOT DETECTED   Staphylococcus aureus NOT DETECTED NOT DETECTED   Methicillin resistance NOT DETECTED NOT DETECTED   Streptococcus species NOT DETECTED NOT DETECTED   Streptococcus agalactiae NOT DETECTED NOT DETECTED   Streptococcus pneumoniae NOT DETECTED NOT DETECTED   Streptococcus pyogenes NOT DETECTED NOT DETECTED   Acinetobacter baumannii NOT DETECTED NOT DETECTED   Enterobacteriaceae species NOT DETECTED NOT DETECTED   Enterobacter cloacae complex NOT DETECTED NOT DETECTED   Escherichia coli NOT DETECTED NOT DETECTED   Klebsiella oxytoca NOT DETECTED NOT DETECTED   Klebsiella pneumoniae NOT DETECTED NOT DETECTED   Proteus species NOT DETECTED NOT DETECTED   Serratia marcescens NOT DETECTED NOT DETECTED   Haemophilus influenzae NOT DETECTED NOT DETECTED   Neisseria meningitidis NOT DETECTED NOT DETECTED   Pseudomonas aeruginosa NOT DETECTED NOT DETECTED   Candida albicans NOT DETECTED NOT DETECTED   Candida glabrata NOT DETECTED NOT DETECTED   Candida krusei NOT DETECTED NOT DETECTED   Candida parapsilosis NOT DETECTED NOT DETECTED   Candida tropicalis NOT DETECTED NOT DETECTED    Name of physician (or Provider) Contacted: Surgery MD  Changes to prescribed antibiotics required: None, likely contaminant  Harvel Quale 03/02/2017  10:04 AM

## 2017-03-02 NOTE — Consult Note (Signed)
[facility] Nurse wound consult note Reason for Consult:Multiple deep tissue injury from fall and "down" for prolonged period.  Right hand, left anterior thigh, right shoulder Callous to right medial heel with maroon discoloration Right chest below pectoral muscle Center of chest Wound type:Pressure from prolonged fall and unable to get up, move Pressure Injury POA: Yes Measurement:RIght hand, scattered nonintact sloughing epithelium noted to thumb, dorsal hand. Left thigh : 8 cm x 4 cm x 0.2 cm sloughing epithelium Right shoulder:  2 cm x 2 cm x 0.2 cm sloughing epithelium Right chest below pectoral muscle:  2.5 cm x 11 cm intact maroon discoloration CEnter chest:  2 cmx  2 cm intact maroon discoloration, although beginning to slough Right medial heel:  4 cm x 3.2 cm intact callous with 2 cm x 2 cm maroon discoloration.  Wound BOM:QTTC moist tissue beneath sloughing epithelium Drainage (amount, consistency, odor) serous weeping Periwound:intact Dressing procedure/placement/frequency:Cleanse deep tissue wounds to right hand, left thigh, right should and chest with soap and water.  Apply Silvadene twice daily. COver with dry dressing.   Monitor callous/deep tissue injury to right heel Will not follow at this time.  Please re-consult if needed.  Domenic Moras RN BSN [place] Pager [phone]

## 2017-03-02 NOTE — Progress Notes (Signed)
CRITICAL VALUE ALERT  Critical Value:  Lactic acid  Date & Time Notied:  03/02/2017  Provider Notified: Dr. Jimmey Ralph  Orders Received/Actions taken: Provider on floor, writing new orders
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
## 2017-03-02 NOTE — Progress Notes (Signed)
Progress Note: General Surgery Service   Assessment/Plan: Patient Active Problem List   Diagnosis Date Noted  . Pressure injury of skin 03/02/2017  . Acute renal failure (ARF) ([place]) 02/27/2017  . Unresponsive 03/09/2017  . Bradycardia, sinus 02/28/2017  . Hypotension due to hypovolemia    Found down, nonresponsive ARF - fluids, recheck lytes this afternoon, if no better will get renal consult Concern for rhabdo - CK 300s, recheck Ck and get myo urine VDRF - continue ventilator hypotension - on vaso and levo, continue as required Muscular spasm/concern for seizure - neurology notified, concerned for uremic myoclonus, will f/u recs GI - no active issue, continue OG and NPO during work up Endo - hyperglycemic, will correct with SSI    LOS: 1 day  Chief Complaint/Subjective: Found down, intubated, requiring pressors, creatinine continuing to rise, multiple pressure ulcers  Objective: Vital signs in last 24 hours: Temp:  [85.8 F (29.9 C)-101.7 F (38.7 C)] 99.3 F (37.4 C) (10/20 0900) Pulse Rate:  [56-114] 83 (10/20 0900) Resp:  [12-29] 28 (10/20 0900) BP: (73-138)/(52-81) 117/57 (10/20 0900) SpO2:  [95 %-100 %] 95 % (10/20 0900) Arterial Line BP: (64-134)/(40-70) 91/52 (10/20 0900) FiO2 (%):  [40 %-100 %] 40 % (10/20 0800) Weight:  [65 kg (143 lb 4.8 oz)] 65 kg (143 lb 4.8 oz) (10/19 1300) Last BM Date:  (PTA)  Intake/Output from previous day: 10/19 0701 - 10/20 0700 In: 6680.5 [I.V.:6430.5; IV Piggyback:150] Out: 410 [Urine:410] Intake/Output this shift: Total I/O In: 193.6 [I.V.:193.6] Out: -   Lungs: CTAB  Cardiovascular: pressor requirement, intermittently tachycardic  Abd: soft, NT  Extremities: multiple wounds with eschars  Neuro: GCS 3t  Lab Results: CBC   Recent Labs  02/27/2017 1209 03/02/17 0550  WBC 15.1* 19.4*  HGB DELTA CHECK NOTED 11.0*  HCT 32.7* 35.0*  PLT 333 534*   BMET  Recent Labs  02/20/2017 1209 03/02/17 0550  NA 155* 145    K 4.6 5.9*  CL 119* 112*  CO2 19* 16*  GLUCOSE 271* 283*  BUN 205* 194*  CREATININE 3.74*  3.75* 4.07*  CALCIUM 8.1* 7.5*   PT/INR  Recent Labs  03/05/2017 1025 03/02/17 0550  LABPROT 18.0* 21.6*  INR 1.50 1.90   ABG  Recent Labs  03/06/2017 1038 03/07/2017 1257  PHART  --  7.599*  HCO3 27.2 25.4    Studies/Results:  Anti-infectives: Anti-infectives    Start     Dose/Rate Route Frequency Ordered Stop   03/08/2017 1200  vancomycin (VANCOCIN) 1,500 mg in sodium chloride 0.9 % 500 mL IVPB     1,500 mg 250 mL/hr over 120 Minutes Intravenous  Once 02/15/2017 1124 02/24/2017 1507   02/24/2017 1200  piperacillin-tazobactam (ZOSYN) IVPB 2.25 g     2.25 g 100 mL/hr over 30 Minutes Intravenous Every 8 hours 02/11/2017 1124     02/18/2017 1115  piperacillin-tazobactam (ZOSYN) IVPB 3.375 g  Status:  Discontinued     3.375 g 100 mL/hr over 30 Minutes Intravenous  Once 03/02/2017 1106 02/26/2017 1124   02/26/2017 1115  vancomycin (VANCOCIN) IVPB 1000 mg/200 mL premix  Status:  Discontinued     1,000 mg 200 mL/hr over 60 Minutes Intravenous  Once 03/12/2017 1106 02/12/2017 1124      Medications: Scheduled Meds: . chlorhexidine gluconate (MEDLINE KIT)  15 mL Mouth Rinse BID  . enoxaparin (LOVENOX) injection  40 mg Subcutaneous Q24H  . insulin aspart  0-15 Units Subcutaneous Q4H  . mouth rinse  15 mL Mouth  Rinse 10 times per day  . pantoprazole  40 mg Oral Daily   Or  . pantoprazole (PROTONIX) IV  40 mg Intravenous Daily   Continuous Infusions: . sodium chloride 150 mL/hr at 02/17/2017 2100  . DOPamine 6 mcg/kg/min (03/02/17 0930)  . norepinephrine (LEVOPHED) Adult infusion 20 mcg/min (03/02/17 0930)  . piperacillin-tazobactam (ZOSYN)  IV Stopped (03/02/17 4371)   PRN Meds:.LORazepam  Mickeal Skinner, MD Pg# [phone] [facility]

## 2017-03-03 ENCOUNTER — Inpatient Hospital Stay (HOSPITAL_COMMUNITY): Payer: Medicare Other

## 2017-03-03 DIAGNOSIS — J9601 Acute respiratory failure with hypoxia: Secondary | ICD-10-CM

## 2017-03-03 DIAGNOSIS — R4189 Other symptoms and signs involving cognitive functions and awareness: Secondary | ICD-10-CM

## 2017-03-03 DIAGNOSIS — R6521 Severe sepsis with septic shock: Secondary | ICD-10-CM

## 2017-03-03 DIAGNOSIS — N171 Acute kidney failure with acute cortical necrosis: Secondary | ICD-10-CM

## 2017-03-03 LAB — BLOOD GAS, ARTERIAL
Acid-base deficit: 4.8 mmol/L — ABNORMAL HIGH (ref 0.0–2.0)
Bicarbonate: 18.7 mmol/L — ABNORMAL LOW (ref 20.0–28.0)
DRAWN BY: 39898
FIO2: 40
MECHVT: 540 mL
O2 SAT: 99.3 %
PCO2 ART: 28.4 mmHg — AB (ref 32.0–48.0)
PEEP: 5 cmH2O
PH ART: 7.433 (ref 7.350–7.450)
Patient temperature: 98.6
RATE: 14 resp/min
pO2, Arterial: 178 mmHg — ABNORMAL HIGH (ref 83.0–108.0)

## 2017-03-03 LAB — CULTURE, BLOOD (ROUTINE X 2): Special Requests: ADEQUATE

## 2017-03-03 LAB — CBC WITH DIFFERENTIAL/PLATELET
Basophils Absolute: 0 10*3/uL (ref 0.0–0.1)
Basophils Relative: 0 %
Eosinophils Absolute: 0 10*3/uL (ref 0.0–0.7)
Eosinophils Relative: 0 %
HEMATOCRIT: 29.6 % — AB (ref 39.0–52.0)
HEMOGLOBIN: 9.5 g/dL — AB (ref 13.0–17.0)
LYMPHS PCT: 5 %
Lymphs Abs: 1.1 10*3/uL (ref 0.7–4.0)
MCH: 25.1 pg — AB (ref 26.0–34.0)
MCHC: 32.1 g/dL (ref 30.0–36.0)
MCV: 78.3 fL (ref 78.0–100.0)
MONOS PCT: 6 %
Monocytes Absolute: 1.4 10*3/uL — ABNORMAL HIGH (ref 0.1–1.0)
NEUTROS ABS: 20 10*3/uL — AB (ref 1.7–7.7)
Neutrophils Relative %: 89 %
Platelets: 299 10*3/uL (ref 150–400)
RBC: 3.78 MIL/uL — ABNORMAL LOW (ref 4.22–5.81)
RDW: 20 % — ABNORMAL HIGH (ref 11.5–15.5)
WBC: 22.5 10*3/uL — ABNORMAL HIGH (ref 4.0–10.5)

## 2017-03-03 LAB — PHOSPHORUS: Phosphorus: 5.1 mg/dL — ABNORMAL HIGH (ref 2.5–4.6)

## 2017-03-03 LAB — GLUCOSE, CAPILLARY
GLUCOSE-CAPILLARY: 118 mg/dL — AB (ref 65–99)
GLUCOSE-CAPILLARY: 155 mg/dL — AB (ref 65–99)
GLUCOSE-CAPILLARY: 234 mg/dL — AB (ref 65–99)
Glucose-Capillary: 151 mg/dL — ABNORMAL HIGH (ref 65–99)
Glucose-Capillary: 216 mg/dL — ABNORMAL HIGH (ref 65–99)
Glucose-Capillary: 243 mg/dL — ABNORMAL HIGH (ref 65–99)
Glucose-Capillary: 268 mg/dL — ABNORMAL HIGH (ref 65–99)
Glucose-Capillary: 272 mg/dL — ABNORMAL HIGH (ref 65–99)

## 2017-03-03 LAB — COMPREHENSIVE METABOLIC PANEL
ALK PHOS: 712 U/L — AB (ref 38–126)
ALT: 81 U/L — ABNORMAL HIGH (ref 17–63)
ANION GAP: 15 (ref 5–15)
AST: 363 U/L — ABNORMAL HIGH (ref 15–41)
Albumin: 1.5 g/dL — ABNORMAL LOW (ref 3.5–5.0)
BILIRUBIN TOTAL: 2.2 mg/dL — AB (ref 0.3–1.2)
BUN: 165 mg/dL — ABNORMAL HIGH (ref 6–20)
CALCIUM: 7.1 mg/dL — AB (ref 8.9–10.3)
CO2: 18 mmol/L — ABNORMAL LOW (ref 22–32)
Chloride: 107 mmol/L (ref 101–111)
Creatinine, Ser: 3.79 mg/dL — ABNORMAL HIGH (ref 0.61–1.24)
GFR, EST AFRICAN AMERICAN: 17 mL/min — AB (ref >60)
GFR, EST NON AFRICAN AMERICAN: 15 mL/min — AB (ref >60)
GLUCOSE: 292 mg/dL — AB (ref 65–99)
POTASSIUM: 4.6 mmol/L (ref 3.5–5.1)
Sodium: 140 mmol/L (ref 135–145)
TOTAL PROTEIN: 6.1 g/dL — AB (ref 6.5–8.1)

## 2017-03-03 LAB — ECHOCARDIOGRAM COMPLETE
Height: 72 in
Weight: 2292.78 oz

## 2017-03-03 LAB — LACTIC ACID, PLASMA
Lactic Acid, Venous: 3.8 mmol/L (ref 0.5–1.9)
Lactic Acid, Venous: 4.3 mmol/L (ref 0.5–1.9)

## 2017-03-03 LAB — CK: CK TOTAL: 778 U/L — AB (ref 49–397)

## 2017-03-03 LAB — PROCALCITONIN: Procalcitonin: 8.2 ng/mL

## 2017-03-03 LAB — MAGNESIUM: MAGNESIUM: 3 mg/dL — AB (ref 1.7–2.4)

## 2017-03-03 LAB — LIPASE, BLOOD: Lipase: 85 U/L — ABNORMAL HIGH (ref 11–51)

## 2017-03-03 LAB — TROPONIN I: Troponin I: 0.63 ng/mL (ref <0.03)

## 2017-03-03 MED ORDER — SODIUM CHLORIDE 0.9% FLUSH: 10.0000 mL | INTRAVENOUS | Status: DC | PRN

## 2017-03-03 MED ORDER — SENNOSIDES 8.8 MG/5ML PO SYRP
5.0000 mL | ORAL_SOLUTION | Freq: Two times a day (BID) | ORAL | Status: DC
  Administered 2017-03-03 – 2017-03-04 (×3): 5 mL via ORAL
  Filled 2017-03-03 (×3): qty 5

## 2017-03-03 MED ORDER — SODIUM CHLORIDE 0.9% FLUSH
10.0000 mL | Freq: Two times a day (BID) | INTRAVENOUS | Status: DC
  Administered 2017-03-04: 20 mL
  Administered 2017-03-04 – 2017-03-05 (×2): 10 mL
  Administered 2017-03-06: 30 mL
  Administered 2017-03-06 – 2017-03-07 (×2): 10 mL
  Administered 2017-03-07: 30 mL
  Administered 2017-03-08 – 2017-03-13 (×9): 10 mL

## 2017-03-03 MED ORDER — SODIUM CHLORIDE 0.9 % IV BOLUS (SEPSIS)
1000.0000 mL | Freq: Once | INTRAVENOUS | Status: AC
  Administered 2017-03-03: 1000 mL via INTRAVENOUS

## 2017-03-03 MED ORDER — SODIUM POLYSTYRENE SULFONATE 15 GM/60ML PO SUSP
15.0000 g | Freq: Once | ORAL | Status: AC
  Administered 2017-03-03: 15 g via ORAL
  Filled 2017-03-03: qty 60

## 2017-03-03 MED ORDER — INSULIN ASPART 100 UNIT/ML ~~LOC~~ SOLN
0.0000 [IU] | SUBCUTANEOUS | Status: DC
  Administered 2017-03-03: 4 [IU] via SUBCUTANEOUS
  Administered 2017-03-03: 7 [IU] via SUBCUTANEOUS
  Administered 2017-03-03: 11 [IU] via SUBCUTANEOUS
  Administered 2017-03-03: 4 [IU] via SUBCUTANEOUS
  Administered 2017-03-04: 3 [IU] via SUBCUTANEOUS
  Administered 2017-03-04 (×2): 4 [IU] via SUBCUTANEOUS
  Administered 2017-03-05: 3 [IU] via SUBCUTANEOUS
  Administered 2017-03-05: 4 [IU] via SUBCUTANEOUS
  Administered 2017-03-05: 3 [IU] via SUBCUTANEOUS
  Administered 2017-03-05 (×3): 4 [IU] via SUBCUTANEOUS
  Administered 2017-03-06 (×2): 3 [IU] via SUBCUTANEOUS
  Administered 2017-03-06: 4 [IU] via SUBCUTANEOUS
  Administered 2017-03-06: 3 [IU] via SUBCUTANEOUS
  Administered 2017-03-07: 4 [IU] via SUBCUTANEOUS
  Administered 2017-03-07 (×2): 3 [IU] via SUBCUTANEOUS
  Administered 2017-03-08 (×2): 4 [IU] via SUBCUTANEOUS
  Administered 2017-03-08: 3 [IU] via SUBCUTANEOUS
  Administered 2017-03-09: 4 [IU] via SUBCUTANEOUS
  Administered 2017-03-09: 3 [IU] via SUBCUTANEOUS
  Administered 2017-03-09 (×2): 4 [IU] via SUBCUTANEOUS
  Administered 2017-03-09: 3 [IU] via SUBCUTANEOUS
  Administered 2017-03-10: 4 [IU] via SUBCUTANEOUS
  Administered 2017-03-10 (×2): 3 [IU] via SUBCUTANEOUS
  Administered 2017-03-10 – 2017-03-11 (×4): 4 [IU] via SUBCUTANEOUS
  Administered 2017-03-11 (×3): 3 [IU] via SUBCUTANEOUS

## 2017-03-03 MED ORDER — CHLORHEXIDINE GLUCONATE CLOTH 2 % EX PADS
6.0000 | MEDICATED_PAD | Freq: Every day | CUTANEOUS | Status: DC
  Administered 2017-03-03 – 2017-03-13 (×11): 6 via TOPICAL

## 2017-03-03 NOTE — Progress Notes (Signed)
CRITICAL VALUE ALERT  Critical Value:  Lactic Acid 4.3  Date & Time Notied:  03/03/17 0403  Provider Notified: Dr. Jennet Maduro  Orders Received/Actions taken: No orders received at this time.  Will continue to monitor patient.

## 2017-03-03 NOTE — Progress Notes (Signed)
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    Trevaun Rendleman A 03/03/2017

## 2017-03-03 NOTE — Progress Notes (Signed)
RT called to pt's room due to blood dripping from A-line pullback syringe.  RT checked line and replaced the line and dressing.  Good blood flow and A-line wave with the new step-up.
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
## 2017-03-03 NOTE — Progress Notes (Signed)
  Echocardiogram 2D Echocardiogram has been performed.  Merrie Roof F 03/03/2017, 10:13 AM
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
## 2017-03-03 NOTE — Progress Notes (Signed)
Pt transported to 4N from MRI via vent with no complications.

## 2017-03-03 NOTE — Progress Notes (Addendum)
Pt placed back on full vent support due to low MVe and RR.  Pt scheduled MRI trip for 9am.

## 2017-03-03 NOTE — Progress Notes (Signed)
[facility] Progress Note  Subjective: BP's up and UOP about the same (450 cc yesterday).  Creat on admit 3.7 > 4.0 > 3.9 and 3.7 today, and  BUN down 205 > 165 with IVF"s.  Pt on vent, making some involuntary movements. Still on levo gtt.   Vitals:   03/03/17 1000 03/03/17 1030 03/03/17 1104 03/03/17 1202  BP: (!) 127/56 (!) 130/59 128/66   Pulse: 60 60 63   Resp: (!) 22 19 15    Temp: (!) 97 F (36.1 C) (!) 97 F (36.1 C)  (!) 97.1 F (36.2 C)  TempSrc:    Axillary  SpO2: 100% 100% 100%   Weight:      Height:        Inpatient medications: . chlorhexidine gluconate (MEDLINE KIT)  15 mL Mouth Rinse BID  . Chlorhexidine Gluconate Cloth  6 each Topical Daily  . docusate  100 mg Per Tube BID  . fentaNYL (SUBLIMAZE) injection  50 mcg Intravenous Once  . folic acid  1 mg Intravenous Daily  . heparin  5,000 Units Subcutaneous Q8H  . insulin aspart  0-20 Units Subcutaneous Q4H  . mouth rinse  15 mL Mouth Rinse QID  . multivitamin  15 mL Oral Daily  . pantoprazole  40 mg Oral Daily   Or  . pantoprazole (PROTONIX) IV  40 mg Intravenous Daily  . sennosides  5 mL Oral BID  . silver sulfADIAZINE   Topical BID  . sodium chloride flush  10-40 mL Intracatheter Q12H   . fentaNYL infusion INTRAVENOUS Stopped (03/02/17 1526)  . levETIRAcetam 500 mg (03/03/17 1215)  . norepinephrine (LEVOPHED) Adult infusion 12 mcg/min (03/03/17 1100)  . piperacillin-tazobactam (ZOSYN)  IV 2.25 g (03/03/17 0557)  .  sodium bicarbonate  infusion 1000 mL 150 mL/hr at 03/03/17 1040  . sodium chloride Stopped (03/03/17 1242)  . thiamine injection Stopped (03/03/17 0942)   albuterol, clonazepam, fentaNYL, midazolam, midazolam, sodium chloride flush  Exam: Gen on vent, not responsive, some invol movements today Sclera anicteric, throat w ETT  Chest clear bilat ant and lat RRR no MRG Abd soft ntnd no mass or ascites +bs scaphoid GU normal male w foley draining some small amts clear yellow  urine MS no joint effusions or deformity Ext 1+ bilat LE and UE edema diffusely Neuro - not responding on vent, on some sedation    CXR - clear, normal heart, normal lungs  Impression: 1. Renal failure - in pt found down at home, hypothermic on admission (89 deg F), not sure how long he was down.  Creat 4 on presentation, UA neg and CT normal kidneys. Suspect severe dehydration+ ACE inhibitor.  Creat down 3.7 now, BUN down w/ IVF"s, making some urine 0.5 L /day, CVP 6-9, 3rd spacing some fluid into extremities. Would continue to treat medically for now 2. Shock/ hypothermia - remains on pressors 3. AMS - + cirrhosis by imaging, poss anoxic damage 4. Cirrhosis - by imaging 5. HTN - was on lisinopril at home 6. DM2 - was on insulin/ metformin at home. Per primary.   Plan - as above.    Kelly Splinter MD [facility] pager [phone]   03/03/2017, 12:17 PM    Recent Labs Lab 03/02/17 0550 03/02/17 1445 03/03/17 0500  NA 145 143 140  K 5.9* 4.9 4.6  CL 112* 112* 107  CO2 16* 17* 18*  GLUCOSE 283* 216* 292*  BUN 194* 183* 165*  CREATININE 4.07* 3.92* 3.79*  CALCIUM  7.5* 7.0* 7.1*  PHOS  --  5.5* 5.1*    Recent Labs Lab 02/16/2017 1218 03/02/17 0550 03/03/17 0500  AST 78* 254* 363*  ALT 31 55 81*  ALKPHOS 629* 889* 712*  BILITOT 2.2* 2.3* 2.2*  PROT 6.1* 6.5 6.1*  ALBUMIN 1.7* 1.7* 1.5*    Recent Labs Lab 03/02/17 0550 03/02/17 1445 03/03/17 0852  WBC 19.4* 19.2* 22.5*  NEUTROABS  --   --  20.0*  HGB 11.0* 9.5* 9.5*  HCT 35.0* 31.4* 29.6*  MCV 78.3 79.5 78.3  PLT 534* 407* 299   Iron/TIBC/Ferritin/ %Sat No results found for: IRON, TIBC, FERRITIN, IRONPCTSAT

## 2017-03-03 NOTE — Progress Notes (Signed)
Progress Note  Patient Name: Dennis [name] Date of Encounter: 03/03/2017  Primary Cardiologist: New (Dr. Radford Pax)  Subjective   Unable to obtain.  Intubated.   Inpatient Medications    Scheduled Meds: . chlorhexidine gluconate (MEDLINE KIT)  15 mL Mouth Rinse BID  . Chlorhexidine Gluconate Cloth  6 each Topical Daily  . docusate  100 mg Per Tube BID  . fentaNYL (SUBLIMAZE) injection  50 mcg Intravenous Once  . folic acid  1 mg Intravenous Daily  . heparin  5,000 Units Subcutaneous Q8H  . insulin aspart  0-20 Units Subcutaneous Q4H  . mouth rinse  15 mL Mouth Rinse QID  . multivitamin  15 mL Oral Daily  . pantoprazole  40 mg Oral Daily   Or  . pantoprazole (PROTONIX) IV  40 mg Intravenous Daily  . sennosides  5 mL Oral BID  . silver sulfADIAZINE   Topical BID  . sodium chloride flush  10-40 mL Intracatheter Q12H   Continuous Infusions: . fentaNYL infusion INTRAVENOUS Stopped (03/02/17 1526)  . levETIRAcetam 500 mg (03/03/17 1215)  . norepinephrine (LEVOPHED) Adult infusion 12 mcg/min (03/03/17 1100)  . piperacillin-tazobactam (ZOSYN)  IV 2.25 g (03/03/17 0557)  .  sodium bicarbonate  infusion 1000 mL 150 mL/hr at 03/03/17 1040  . thiamine injection Stopped (03/03/17 0942)   PRN Meds: albuterol, clonazepam, fentaNYL, midazolam, midazolam, sodium chloride flush   Vital Signs    Vitals:   03/03/17 1030 03/03/17 1104 03/03/17 1202 03/03/17 1228  BP: (!) 130/59 128/66  (!) 138/50  Pulse: 60 63  (!) 57  Resp: 19 15  (!) 21  Temp: (!) 97 F (36.1 C)  (!) 97.1 F (36.2 C)   TempSrc:   Axillary   SpO2: 100% 100%  100%  Weight:      Height:        Intake/Output Summary (Last 24 hours) at 03/03/17 1304 Last data filed at 03/03/17 1100  Gross per 24 hour  Intake          6195.26 ml  Output              370 ml  Net          5825.26 ml   Filed Weights   03/10/2017 1300  Weight: 65 kg (143 lb 4.8 oz)    Telemetry    Sinus brady and sinus rhythm rates 50-70s.   No events. - Personally Reviewed  ECG    03/02/17: Sinus bradycardia.  Rate 51 bpm.  QTc 501 ms.  - Personally Reviewed  Physical Exam   VS:  BP (!) 138/50   Pulse (!) 57   Temp (!) 97.1 F (36.2 C) (Axillary)   Resp (!) 21   Ht 6' (1.829 m)   Wt 65 kg (143 lb 4.8 oz)   SpO2 100%   BMI 19.43 kg/m  , BMI Body mass index is 19.43 kg/m. GENERAL:  Chronically ill-appearing.  Intubated.  Not sedated.   HEENT: Pupils equal round and reactive, fundi not visualized  R ear edema NECK:  No jugular venous distention, waveform within normal limits, carotid upstroke brisk and symmetric, no bruits, no thyromegaly LUNGS:  Clear to auscultation bilaterally on anterior exam.  HEART:  RRR.  PMI not displaced or sustained,S1 and S2 within normal limits, no S3, no S4, no clicks, no rubs, no murmurs ABD:  Flat, positive bowel sounds normal in frequency in pitch, no bruits, no rebound, no guarding, no midline pulsatile mass, no  hepatomegaly, no splenomegaly EXT:  2 plus pulses throughout, no edema, no cyanosis no clubbing SKIN:  Crusted lesions on forehead.  Blisters on L anterior tibia NEURO:  Intubated. Unable to obtain PSYCH:  Intubated. Unable to obtain   Labs    Chemistry  Recent Labs Lab 03/09/2017 1218 03/02/17 0550 03/02/17 1445 03/03/17 0500  NA  --  145 143 140  K  --  5.9* 4.9 4.6  CL  --  112* 112* 107  CO2  --  16* 17* 18*  GLUCOSE  --  283* 216* 292*  BUN  --  194* 183* 165*  CREATININE  --  4.07* 3.92* 3.79*  CALCIUM  --  7.5* 7.0* 7.1*  PROT 6.1* 6.5  --  6.1*  ALBUMIN 1.7* 1.7*  --  1.5*  AST 78* 254*  --  363*  ALT 31 55  --  81*  ALKPHOS 629* 889*  --  712*  BILITOT 2.2* 2.3*  --  2.2*  GFRNONAA  --  14* 14* 15*  GFRAA  --  16* 16* 17*  ANIONGAP  --  17* 14 15     Hematology  Recent Labs Lab 03/02/17 0550 03/02/17 1445 03/03/17 0852  WBC 19.4* 19.2* 22.5*  RBC 4.47 3.95* 3.78*  HGB 11.0* 9.5* 9.5*  HCT 35.0* 31.4* 29.6*  MCV 78.3 79.5 78.3  MCH 24.6*  24.1* 25.1*  MCHC 31.4 30.3 32.1  RDW 19.5* 19.6* 20.0*  PLT 534* 407* 299    Cardiac Enzymes  Recent Labs Lab 03/02/17 0242 03/02/17 1445 03/02/17 2013 03/03/17 0232  TROPONINI 0.85* 0.86* 0.76* 0.63*   No results for input(s): TROPIPOC in the last 168 hours.   BNPNo results for input(s): BNP, PROBNP in the last 168 hours.   DDimer No results for input(s): DDIMER in the last 168 hours.   Radiology    Portable Chest Xray  Result Date: 03/03/2017 CLINICAL DATA:  Acute respiratory failure with hypoxia. EXAM: PORTABLE CHEST 1 VIEW COMPARISON:  03/02/2017. FINDINGS: Stable and normal cardiomediastinal silhouette. Support tubes and lines are unchanged. No edema or consolidation.  No pneumothorax. IMPRESSION: Stable chest without acute findings. Electronically Signed   By: Staci Righter M.D.   On: 03/03/2017 07:26   Dg Chest Port 1 View  Result Date: 03/02/2017 CLINICAL DATA:  Sinus bradycardia. Hypotension. Acute renal failure. Central line placement. EXAM: PORTABLE CHEST 1 VIEW COMPARISON:  03/02/2017 FINDINGS: A new right jugular central venous catheter is seen, with tip overlying the superior cavoatrial junction. Endotracheal tube and nasogastric tube remain in appropriate position. No pneumothorax visualized. Heart size and mediastinal contours are normal. Both lungs are clear. IMPRESSION: New right jugular central venous catheter in appropriate position. No evidence of pneumothorax. No active lung disease. Electronically Signed   By: Earle Gell M.D.   On: 03/02/2017 17:21   Dg Chest Port 1 View  Result Date: 03/02/2017 CLINICAL DATA:  Status post intubation EXAM: PORTABLE CHEST 1 VIEW COMPARISON:  02/25/2017 FINDINGS: Cardiac shadow is stable. Endotracheal tube and nasogastric catheter are noted in satisfactory position. The lungs are well aerated bilaterally. No focal infiltrate or sizable effusion is seen. IMPRESSION: Tubes and lines as described above.  No acute infiltrate  noted. Electronically Signed   By: Inez Catalina M.D.   On: 03/02/2017 07:04    Cardiac Studies   Echo 03/03/17: Study Conclusions  - Left ventricle: The cavity size was normal. Wall thickness was   normal. Systolic function was vigorous. The estimated  ejection   fraction was in the range of 65% to 70%. Wall motion was normal;   there were no regional wall motion abnormalities. Doppler   parameters are consistent with abnormal left ventricular   relaxation (grade 1 diastolic dysfunction).  Impressions:  - Hyperdynamic LV systolic function; mild diastolic dysfunction;   calcified aortic valve with no AS by doppler.  Patient Profile     71 y.o. 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  He ws unresponsive and intubated in the ER.  He was transiently bradycardic to the 40s/50s.   Assessment & Plan   # Hypotension: # AMS:  It is unclear what caused Mr. Millhouse to be found down.  BP improved with IV fluids and he is now off dopamine. He remains on norepinephrine.  Echo showed normal systolic function and grade one diastolic dysfunction.  He is being treated empirically with antibiotics.  He does have acute renal failure and uremia may be contributing to his AMS.  Ammonia is also elevated at 76.  This does not appear to be cardiac.   # Elevated troponin:  # Coronary calcification:  No acute findings on EKG.  Suspect this is demand ischemia.  RCA calcification noted on CT. No statin given active hepatitis C and cirrhosis noted on CT.  Normal systolic function on echo.  Aspirin 77m daily.   # Bradycardia: Resolved.    # Aortic valve calcification: Aortic valve sclerosis without stenosis.    # Acute renal failure: Being followed by nephrology.    We will sign off.  Please call if there are questions.    For questions or updates, please contact [place]Please consult [email or web address] for contact info under  Cardiology/STEMI.      Signed, TSkeet Latch MD  03/03/2017, 1:04 PM

## 2017-03-04 ENCOUNTER — Inpatient Hospital Stay (HOSPITAL_COMMUNITY): Payer: Medicare Other

## 2017-03-04 DIAGNOSIS — R4182 Altered mental status, unspecified: Secondary | ICD-10-CM

## 2017-03-04 LAB — CSF CELL COUNT WITH DIFFERENTIAL
RBC Count, CSF: 0 /mm3
TUBE #: 1
WBC, CSF: 0 /mm3 (ref 0–5)

## 2017-03-04 LAB — PROTEIN AND GLUCOSE, CSF
GLUCOSE CSF: 92 mg/dL — AB (ref 40–70)
TOTAL PROTEIN, CSF: 115 mg/dL — AB (ref 15–45)

## 2017-03-04 LAB — POCT I-STAT 3, ART BLOOD GAS (G3+)
Acid-Base Excess: 2 mmol/L (ref 0.0–2.0)
Bicarbonate: 24.8 mmol/L (ref 20.0–28.0)
O2 Saturation: 100 %
PCO2 ART: 29.8 mmHg — AB (ref 32.0–48.0)
PH ART: 7.525 — AB (ref 7.350–7.450)
PO2 ART: 251 mmHg — AB (ref 83.0–108.0)
Patient temperature: 97.5
TCO2: 26 mmol/L (ref 22–32)

## 2017-03-04 LAB — BLOOD GAS, ARTERIAL
Acid-base deficit: 1.4 mmol/L (ref 0.0–2.0)
Bicarbonate: 21 mmol/L (ref 20.0–28.0)
DRAWN BY: 39898
FIO2: 40
MECHVT: 540 mL
O2 SAT: 99.7 %
PATIENT TEMPERATURE: 97
PCO2 ART: 24.1 mmHg — AB (ref 32.0–48.0)
PEEP: 5 cmH2O
PO2 ART: 201 mmHg — AB (ref 83.0–108.0)
RATE: 14 resp/min
pH, Arterial: 7.546 — ABNORMAL HIGH (ref 7.350–7.450)

## 2017-03-04 LAB — CBC WITH DIFFERENTIAL/PLATELET
BASOS ABS: 0 10*3/uL (ref 0.0–0.1)
Basophils Relative: 0 %
EOS PCT: 0 %
Eosinophils Absolute: 0.1 10*3/uL (ref 0.0–0.7)
HEMATOCRIT: 28.3 % — AB (ref 39.0–52.0)
Hemoglobin: 9.4 g/dL — ABNORMAL LOW (ref 13.0–17.0)
LYMPHS ABS: 0.8 10*3/uL (ref 0.7–4.0)
LYMPHS PCT: 4 %
MCH: 25.3 pg — ABNORMAL LOW (ref 26.0–34.0)
MCHC: 33.2 g/dL (ref 30.0–36.0)
MCV: 76.3 fL — ABNORMAL LOW (ref 78.0–100.0)
Monocytes Absolute: 1.5 10*3/uL — ABNORMAL HIGH (ref 0.1–1.0)
Monocytes Relative: 7 %
NEUTROS ABS: 17.6 10*3/uL — AB (ref 1.7–7.7)
Neutrophils Relative %: 88 %
Platelets: 211 10*3/uL (ref 150–400)
RBC: 3.71 MIL/uL — AB (ref 4.22–5.81)
RDW: 19.7 % — AB (ref 11.5–15.5)
WBC: 20 10*3/uL — AB (ref 4.0–10.5)

## 2017-03-04 LAB — GLUCOSE, CAPILLARY
GLUCOSE-CAPILLARY: 166 mg/dL — AB (ref 65–99)
GLUCOSE-CAPILLARY: 122 mg/dL — AB (ref 65–99)
GLUCOSE-CAPILLARY: 72 mg/dL (ref 65–99)
GLUCOSE-CAPILLARY: 98 mg/dL (ref 65–99)
GLUCOSE-CAPILLARY: 135 mg/dL — AB (ref 65–99)
Glucose-Capillary: 158 mg/dL — ABNORMAL HIGH (ref 65–99)

## 2017-03-04 LAB — CULTURE, RESPIRATORY W GRAM STAIN

## 2017-03-04 LAB — COMPREHENSIVE METABOLIC PANEL
ALK PHOS: 682 U/L — AB (ref 38–126)
ALT: 78 U/L — AB (ref 17–63)
ANION GAP: 12 (ref 5–15)
AST: 298 U/L — ABNORMAL HIGH (ref 15–41)
Albumin: 1.4 g/dL — ABNORMAL LOW (ref 3.5–5.0)
BUN: 142 mg/dL — ABNORMAL HIGH (ref 6–20)
CALCIUM: 6.8 mg/dL — AB (ref 8.9–10.3)
CHLORIDE: 103 mmol/L (ref 101–111)
CO2: 21 mmol/L — AB (ref 22–32)
CREATININE: 3.48 mg/dL — AB (ref 0.61–1.24)
GFR calc Af Amer: 19 mL/min — ABNORMAL LOW (ref >60)
GFR calc non Af Amer: 16 mL/min — ABNORMAL LOW (ref >60)
GLUCOSE: 173 mg/dL — AB (ref 65–99)
Potassium: 3.4 mmol/L — ABNORMAL LOW (ref 3.5–5.1)
SODIUM: 136 mmol/L (ref 135–145)
Total Bilirubin: 2.2 mg/dL — ABNORMAL HIGH (ref 0.3–1.2)
Total Protein: 6.1 g/dL — ABNORMAL LOW (ref 6.5–8.1)

## 2017-03-04 LAB — POCT I-STAT, CHEM 8
CALCIUM ION: 0.93 mmol/L — AB (ref 1.15–1.40)
CHLORIDE: 115 mmol/L — AB (ref 101–111)
Creatinine, Ser: 3.9 mg/dL — ABNORMAL HIGH (ref 0.61–1.24)
GLUCOSE: 292 mg/dL — AB (ref 65–99)
HCT: 40 % (ref 39.0–52.0)
Hemoglobin: 13.6 g/dL (ref 13.0–17.0)
POTASSIUM: 6.5 mmol/L — AB (ref 3.5–5.1)
Sodium: 155 mmol/L — ABNORMAL HIGH (ref 135–145)
TCO2: 26 mmol/L (ref 22–32)

## 2017-03-04 LAB — MAGNESIUM: Magnesium: 2.8 mg/dL — ABNORMAL HIGH (ref 1.7–2.4)

## 2017-03-04 LAB — CULTURE, RESPIRATORY

## 2017-03-04 LAB — PROCALCITONIN: PROCALCITONIN: 5.49 ng/mL

## 2017-03-04 LAB — MYOGLOBIN, URINE: Myoglobin, Ur: 13 ng/mL (ref 0–13)

## 2017-03-04 LAB — LACTIC ACID, PLASMA: LACTIC ACID, VENOUS: 4.4 mmol/L — AB (ref 0.5–1.9)

## 2017-03-04 LAB — AMMONIA: Ammonia: 72 umol/L — ABNORMAL HIGH (ref 9–35)

## 2017-03-04 LAB — PHOSPHORUS: Phosphorus: 4.9 mg/dL — ABNORMAL HIGH (ref 2.5–4.6)

## 2017-03-04 MED ORDER — NEPRO/CARBSTEADY PO LIQD
1000.0000 mL | ORAL | Status: DC
  Administered 2017-03-04: 1000 mL
  Filled 2017-03-04: qty 1000

## 2017-03-04 MED ORDER — LACTULOSE 10 GM/15ML PO SOLN
30.0000 g | Freq: Once | ORAL | Status: DC
Start: 2017-03-04 — End: 2017-03-06

## 2017-03-04 MED ORDER — PANTOPRAZOLE SODIUM 40 MG PO PACK
40.0000 mg | PACK | Freq: Every day | ORAL | Status: DC
  Administered 2017-03-05 – 2017-03-11 (×7): 40 mg
  Filled 2017-03-04 (×7): qty 20

## 2017-03-04 MED ORDER — FOLIC ACID 1 MG PO TABS
1.0000 mg | ORAL_TABLET | Freq: Every day | ORAL | Status: DC
  Administered 2017-03-04 – 2017-03-11 (×8): 1 mg
  Filled 2017-03-04 (×8): qty 1

## 2017-03-04 MED ORDER — CHLORHEXIDINE GLUCONATE 0.12 % MT SOLN
OROMUCOSAL | Status: AC
  Filled 2017-03-04: qty 15

## 2017-03-04 MED ORDER — SODIUM CHLORIDE 0.9 % IV SOLN
200.0000 mg | Freq: Once | INTRAVENOUS | Status: AC
  Administered 2017-03-04: 200 mg via INTRAVENOUS
  Filled 2017-03-04: qty 20

## 2017-03-04 MED ORDER — LACTULOSE 10 GM/15ML PO SOLN
30.0000 g | Freq: Two times a day (BID) | ORAL | Status: DC
  Administered 2017-03-05 – 2017-03-09 (×8): 30 g via ORAL
  Filled 2017-03-04 (×10): qty 45

## 2017-03-04 MED ORDER — SODIUM CHLORIDE 0.9 % IV BOLUS (SEPSIS)
1000.0000 mL | Freq: Once | INTRAVENOUS | Status: AC
  Administered 2017-03-04: 1000 mL via INTRAVENOUS

## 2017-03-04 MED ORDER — NEPRO/CARBSTEADY PO LIQD
1000.0000 mL | ORAL | Status: DC
  Administered 2017-03-07 – 2017-03-10 (×5): 1000 mL
  Filled 2017-03-04 (×9): qty 1000

## 2017-03-04 MED ORDER — PRO-STAT SUGAR FREE PO LIQD
60.0000 mL | Freq: Two times a day (BID) | ORAL | Status: DC
  Administered 2017-03-04 – 2017-03-11 (×16): 60 mL
  Filled 2017-03-04 (×16): qty 60

## 2017-03-04 MED ORDER — FUROSEMIDE 10 MG/ML IJ SOLN
40.0000 mg | Freq: Once | INTRAMUSCULAR | Status: AC
  Administered 2017-03-04: 40 mg via INTRAVENOUS
  Filled 2017-03-04: qty 4

## 2017-03-04 MED ORDER — HEPARIN SODIUM (PORCINE) 5000 UNIT/ML IJ SOLN: 5000.0000 [IU] | Freq: Three times a day (TID) | INTRAMUSCULAR | Status: DC

## 2017-03-04 MED ORDER — LIDOCAINE HCL (PF) 1 % IJ SOLN
INTRAMUSCULAR | Status: AC
  Filled 2017-03-04: qty 5

## 2017-03-04 MED ORDER — PIVOT 1.5 CAL PO LIQD: 1000.0000 mL | ORAL | Status: DC

## 2017-03-04 MED ORDER — VITAMIN B-1 100 MG PO TABS
100.0000 mg | ORAL_TABLET | Freq: Every day | ORAL | Status: DC
  Administered 2017-03-05 – 2017-03-11 (×7): 100 mg
  Filled 2017-03-04 (×7): qty 1

## 2017-03-04 MED ORDER — SODIUM BICARBONATE 650 MG PO TABS: 1300.0000 mg | ORAL_TABLET | Freq: Two times a day (BID) | ORAL | Status: DC

## 2017-03-04 MED ORDER — ADULT MULTIVITAMIN LIQUID CH
15.0000 mL | Freq: Every day | ORAL | Status: DC
  Administered 2017-03-05 – 2017-03-11 (×7): 15 mL
  Filled 2017-03-04 (×7): qty 15

## 2017-03-04 MED ORDER — SODIUM CHLORIDE 0.9 % IV SOLN
100.0000 mg | Freq: Two times a day (BID) | INTRAVENOUS | Status: DC
  Administered 2017-03-04 – 2017-03-05 (×2): 100 mg via INTRAVENOUS
  Filled 2017-03-04 (×3): qty 10

## 2017-03-04 MED ORDER — PIPERACILLIN-TAZOBACTAM 3.375 G IVPB
3.3750 g | Freq: Three times a day (TID) | INTRAVENOUS | Status: AC
  Administered 2017-03-04 – 2017-03-07 (×11): 3.375 g via INTRAVENOUS
  Filled 2017-03-04 (×11): qty 50

## 2017-03-04 MED ORDER — SODIUM POLYSTYRENE SULFONATE 15 GM/60ML PO SUSP: 30.0000 g | Freq: Once | ORAL | Status: DC

## 2017-03-04 MED ORDER — NOREPINEPHRINE BITARTRATE 1 MG/ML IV SOLN
0.0000 ug/min | INTRAVENOUS | Status: DC
  Administered 2017-03-04: 2 ug/min via INTRAVENOUS
  Administered 2017-03-05: 3 ug/min via INTRAVENOUS
  Administered 2017-03-05: 2 ug/min via INTRAVENOUS
  Administered 2017-03-06: 3 ug/min via INTRAVENOUS
  Filled 2017-03-04 (×4): qty 4

## 2017-03-04 NOTE — Progress Notes (Signed)
PULMONARY / CRITICAL CARE MEDICINE   Name: ARLYNN MCDERMID MRN: [id number] DOB: Oct 26, 1945    ADMISSION DATE:  03/12/2017 CONSULTATION DATE:  03/02/2017  REFERRING MD:  Dr. Kieth Brightly, Trauma  CHIEF COMPLAINT:  Respiratory failure  HISTORY OF PRESENT ILLNESS:   71 yo male found unresponsive at home, hypothermic, VDRF, bradycardia, acute renal failure, seizure. PMHx of ETOH, HCV, Pancreatitis, DM, HTN, CKD 2.  SUBJECTIVE:  Tolerating pressure support.  Remains on pressors.  VITAL SIGNS: BP (!) 142/61   Pulse 64   Temp (!) 97.5 F (36.4 C) (Axillary)   Resp 14   Ht 6' (1.829 m)   Wt 192 lb 0.3 oz (87.1 kg)   SpO2 100%   BMI 26.04 kg/m   HEMODYNAMICS: CVP:  [4 mmHg-16 mmHg] 4 mmHg  VENTILATOR SETTINGS: Vent Mode: PRVC FiO2 (%):  [40 %] 40 % Set Rate:  [14 bmp] 14 bmp Vt Set:  [540 mL] 540 mL PEEP:  [5 cmH20] 5 cmH20 Pressure Support:  [5 cmH20] 5 cmH20 Plateau Pressure:  [17 cmH20-19 cmH20] 18 cmH20  INTAKE / OUTPUT: I/O last 3 completed shifts: In: 7075 [I.V.:6825; IV Piggyback:250] Out: 1900 [Urine:1900]  PHYSICAL EXAMINATION:  General - on vent Eyes - pupils reactive ENT - ETT in place Cardiac - regular, no murmur Chest - no wheeze, rales Abd - soft, non tender Ext - no edema Skin - no rashes Neuro - not following commands   LABS:  BMET  Recent Labs Lab 03/02/17 1445 03/03/17 0500 03/04/17 0435  NA 143 140 136  K 4.9 4.6 3.4*  CL 112* 107 103  CO2 17* 18* 21*  BUN 183* 165* 142*  CREATININE 3.92* 3.79* 3.48*  GLUCOSE 216* 292* 173*    Electrolytes  Recent Labs Lab 03/02/17 1445 03/03/17 0500 03/04/17 0435  CALCIUM 7.0* 7.1* 6.8*  MG 3.4* 3.0* 2.8*  PHOS 5.5* 5.1* 4.9*    CBC  Recent Labs Lab 03/02/17 1445 03/03/17 0852 03/04/17 0435  WBC 19.2* 22.5* 20.0*  HGB 9.5* 9.5* 9.4*  HCT 31.4* 29.6* 28.3*  PLT 407* 299 211    Coag's  Recent Labs Lab 02/26/2017 1025 03/02/17 0550  INR 1.50 1.90    Sepsis  Markers  Recent Labs Lab 03/02/17 1445  03/03/17 0500 03/03/17 0853 03/03/17 1247 03/04/17 0435  LATICACIDVEN  --   < >  --  3.8* 4.3* 4.4*  PROCALCITON 8.47  --  8.20  --   --  5.49  < > = values in this interval not displayed.  ABG  Recent Labs Lab 03/02/17 1437 03/03/17 0320 03/04/17 0330  PHART 7.433 7.433 7.546*  PCO2ART 31.9* 28.4* 24.1*  PO2ART 217.0* 178* 201*    Liver Enzymes  Recent Labs Lab 03/02/17 0550 03/03/17 0500 03/04/17 0435  AST 254* 363* 298*  ALT 55 81* 78*  ALKPHOS 889* 712* 682*  BILITOT 2.3* 2.2* 2.2*  ALBUMIN 1.7* 1.5* 1.4*    Cardiac Enzymes  Recent Labs Lab 03/02/17 1445 03/02/17 2013 03/03/17 0232  TROPONINI 0.86* 0.76* 0.63*    Glucose  Recent Labs Lab 03/03/17 1159 03/03/17 1535 03/03/17 1953 03/03/17 2344 03/04/17 0334 03/04/17 0818  GLUCAP 216* 155* 118* 151* 166* 158*    Imaging Mr Brain Wo Contrast  Result Date: 03/03/2017 CLINICAL DATA:  71 y/o M; altered level of consciousness on ventilator. EXAM: MRI HEAD WITHOUT CONTRAST TECHNIQUE: Multiplanar, multiecho pulse sequences of the brain and surrounding structures were obtained without intravenous contrast. COMPARISON:  02/26/2017 CT head FINDINGS:  Brain: Diffuse predominantly cortical reduced diffusion in in the right cerebral hemisphere with relative sparing of the occipital lobe. Additionally there is reduced diffusion in the right lateral putamen, right globus pallidus, and right thalamus. Faint reduced diffusion is present within the left lateral thalamus, left posterior limb of internal capsule, and splenium of corpus callosum. No signal abnormality of brainstem or cerebellum identified. Areas of reduced diffusion demonstrate T2 FLAIR hyperintense signal abnormality and mild local mass effect. No susceptibility hypointensity to indicate intracranial hemorrhage. No extra-axial collection, effacement of basilar cisterns, midline shift, or herniation identified.  Vascular: Normal flow voids. Skull and upper cervical spine: Interval diminution in soft tissue swelling of the right convexity scalp from prior CT. Sinuses/Orbits: Partial paranasal sinus and mastoid opacification is likely due to intubation and nasoenteric tube. Orbits are unremarkable. Other: None. IMPRESSION: Cortical and basal ganglia reduced diffusion and edema throughout the right cerebral hemisphere with relative sparing of the right occipital lobe. All vascular territories are involved. No hemorrhage or significant mass effect. The pattern of signal abnormality could be seen with hypoxic ischemic injury, seizure activity, or infectious/inflammatory/autoimmune encephalitis. Near complete unilateral abnormality favors seizure activity, however, underlying hypoxic insult or encephalitis with secondary seizure is certainly possible. Electronically Signed   By: Kristine Garbe M.D.   On: 03/03/2017 22:25   Dg Chest Port 1 View  Result Date: 03/04/2017 CLINICAL DATA:  Hypoxia EXAM: PORTABLE CHEST 1 VIEW COMPARISON:  March 03, 2017 FINDINGS: Endotracheal tube tip is 4.3 cm above the carina. Central catheter tip is in the superior vena cava. Nasogastric tube tip and side port below the diaphragm. No pneumothorax. No edema or consolidation. Heart size and pulmonary vascularity are normal. No adenopathy. No evident bone lesions. IMPRESSION: Tube and catheter positions as described without pneumothorax. No edema or consolidation. Electronically Signed   By: Lowella Grip III M.D.   On: 03/04/2017 07:38     STUDIES:  CT chest 10/19 >> atherosclerosis CT abd/pelvis 10/19 >> cirrhosis, gallstones, ascites CT head 10/19 >> no acute findings EEG 10/20 >> global cerebral dysfunction worse on Lt, PLEDS from Rt frontal and Rt posterior temporal lobes Echo 10/21 >> EF 65 to 70%, grade 1 DD MRI brain 10/21 >> cortical/basal ganglia edema Rt hemisphere  CULTURES: Blood 10/19 >> Coag neg  Staph Sputum 10/20 >> Acinetobacter Blood 10/20 >>  ANTIBIOTICS: Vancomycin 10/19 >> 10/19 Zosyn 10/19 >>   SIGNIFICANT EVENTS: 10/19 Admit, cardiology consulted 10/20 Neurology, nephrology consulted 10/21 Cardiology s/o  LINES/TUBES: ETT 10/19 >> Lt radial aline 10/19 >>  Rt IJ CVL 10/20 >>  ASSESSMENT / PLAN:  Acute hypoxic respiratory failure. Aspiration pneumonia with Acinetobacter. - continue vent support - day 4 of Abx  Status epilepticus. Hx of ETOH. Elevated ammonia. - AEDs per neurology - f/u EEG, MRA - neurology planning for LP - continue lactulose  Shock from volume depletion and sepsis. 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 - f/u LFTs - thiamine, folic acid, MVI  Bradycardia. - likely from hypothermia - monitor hemodynamics  Acute renal failure from volume depletion and ACE inhibitor. Hypokalemia. Lactic acidosis. - monitor renal fx - replace electrolytes as needed - f/u lactic acid  Deep tissue injuries Rt hand, Lt thigh, Rt shoulder. - present prior to admission - wound care  DM type II. - SSI  Anemia of critical illness and chronic disease. - f/u CBC  DVT prophylaxis - SCDs SUP - Protonix Nutrition - tube  feeds Goals of care - full code  CC time 38 minutes  Chesley Mires, MD [place] 03/04/2017, 11:23 AM Pager:  [phone] After 3pm call: [phone]

## 2017-03-04 NOTE — Progress Notes (Signed)
Follow up - Trauma and Critical Care  Patient Details:    Dennis [name] is an 71 y.o. male.  Lines/tubes : Airway 7 mm (Active)  Secured at (cm) 25 cm 03/04/2017  3:30 AM  Measured From Lips 03/04/2017  3:30 AM  [place] 03/04/2017  3:30 AM  Secured By [employer] 03/04/2017  3:30 AM  Tube Holder Repositioned Yes 03/04/2017  3:30 AM  Cuff Pressure (cm H2O) 26 cm H2O 03/03/2017  8:00 PM  Site Condition Dry 03/04/2017  3:30 AM     CVC Triple Lumen 03/02/17 Right Internal jugular (Active)  Indication for Insertion or Continuance of Line Vasoactive infusions 03/03/2017  8:00 PM  Site Assessment Clean;Dry;Intact 03/03/2017  8:00 PM  Proximal Lumen Status Infusing 03/03/2017  8:00 PM  Medial Lumen Status Infusing 03/03/2017  8:00 PM  Distal Lumen Status Infusing;In-line blood sampling system in place 03/03/2017  8:00 PM  Dressing Type Transparent;Occlusive 03/03/2017  8:00 PM  Dressing Status Clean;Dry;Intact;Antimicrobial disc in place 03/03/2017  8:00 PM  Line Care Connections checked and tightened;Zeroed and calibrated;Leveled 03/03/2017  8:00 PM  Dressing Change Due 03/09/17 03/03/2017  8:00 PM     Arterial Line 03/02/2017 Left Radial (Active)  Site Assessment Clean;Dry;Intact 03/03/2017  8:00 PM  Line Status Pulsatile blood flow 03/03/2017  8:00 PM  Art Line Waveform Appropriate 03/03/2017  8:00 PM  Art Line Interventions Zeroed and calibrated;Leveled;Connections checked and tightened;Line pulled back 03/03/2017  8:00 PM  Color/Movement/Sensation Capillary refill less than 3 sec 03/03/2017  8:00 PM  Dressing Type Transparent;Occlusive 03/03/2017  8:00 PM  Dressing Status Dry;Clean;Intact;Antimicrobial disc in place 03/03/2017  8:00 PM  Interventions Dressing changed;Tubing changed;Antimicrobial disc changed 03/03/2017 10:00 AM  Dressing Change Due 03/10/17 03/03/2017  8:00 PM     Urethral Catheter cconnor rn Non-latex;Temperature probe 14 Fr. (Active)   Indication for Insertion or Continuance of Catheter Unstable critical patients (first 24-48 hours);Bladder outlet obstruction / other urologic reason 03/03/2017  8:00 PM  Site Assessment Clean;Intact 03/03/2017 12:00 PM  Catheter Maintenance Bag below level of bladder;Catheter secured;Drainage bag/tubing not touching floor;No dependent loops 03/03/2017  8:00 PM  Collection Container Standard drainage bag 03/03/2017 12:00 PM  Securement Method Securing device (Describe) 03/03/2017 12:00 PM  Urinary Catheter Interventions Unclamped 03/02/2017  8:00 PM  Input (mL) 100 mL 02/16/2017  2:01 PM  Output (mL) 350 mL 03/04/2017  6:00 AM    Microbiology/Sepsis markers: Results for orders placed or performed during the hospital encounter of 03/03/2017  Blood culture (routine x 2)     Status: Abnormal   Collection Time: 02/16/2017 12:04 PM  Result Value Ref Range Status   Specimen Description BLOOD GROIN  Final   Special Requests   Final    BOTTLES DRAWN AEROBIC AND ANAEROBIC Blood Culture adequate volume   Culture  Setup Time   Final    GRAM POSITIVE COCCI IN CLUSTERS AEROBIC BOTTLE ONLY CRITICAL RESULT CALLED TO, READ BACK BY AND VERIFIED WITH: A MASTERS,PHARMD AT 7902 03/02/17 BY L BENFIELD    Culture (A)  Final    STAPHYLOCOCCUS SPECIES (COAGULASE NEGATIVE) THE SIGNIFICANCE OF ISOLATING THIS ORGANISM FROM A SINGLE SET OF BLOOD CULTURES WHEN MULTIPLE SETS ARE DRAWN IS UNCERTAIN. PLEASE NOTIFY THE MICROBIOLOGY DEPARTMENT WITHIN ONE WEEK IF SPECIATION AND SENSITIVITIES ARE REQUIRED.    Report Status 03/03/2017 FINAL  Final  Blood Culture ID Panel (Reflexed)     Status: Abnormal   Collection Time: 02/16/2017 12:04 PM  Result Value Ref Range  Status   Enterococcus species NOT DETECTED NOT DETECTED Final   Listeria monocytogenes NOT DETECTED NOT DETECTED Final   Staphylococcus species DETECTED (A) NOT DETECTED Final    Comment: Methicillin (oxacillin) susceptible coagulase negative staphylococcus.  Possible blood culture contaminant (unless isolated from more than one blood culture draw or clinical case suggests pathogenicity). No antibiotic treatment is indicated for blood  culture contaminants. CRITICAL RESULT CALLED TO, READ BACK BY AND VERIFIED WITH: A MASTERS,PHARMD AT 9702 03/02/17 BY L BENFIELD    Staphylococcus aureus NOT DETECTED NOT DETECTED Final   Methicillin resistance NOT DETECTED NOT DETECTED Final   Streptococcus species NOT DETECTED NOT DETECTED Final   Streptococcus agalactiae NOT DETECTED NOT DETECTED Final   Streptococcus pneumoniae NOT DETECTED NOT DETECTED Final   Streptococcus pyogenes NOT DETECTED NOT DETECTED Final   Acinetobacter baumannii NOT DETECTED NOT DETECTED Final   Enterobacteriaceae species NOT DETECTED NOT DETECTED Final   Enterobacter cloacae complex NOT DETECTED NOT DETECTED Final   Escherichia coli NOT DETECTED NOT DETECTED Final   Klebsiella oxytoca NOT DETECTED NOT DETECTED Final   Klebsiella pneumoniae NOT DETECTED NOT DETECTED Final   Proteus species NOT DETECTED NOT DETECTED Final   Serratia marcescens NOT DETECTED NOT DETECTED Final   Haemophilus influenzae NOT DETECTED NOT DETECTED Final   Neisseria meningitidis NOT DETECTED NOT DETECTED Final   Pseudomonas aeruginosa NOT DETECTED NOT DETECTED Final   Candida albicans NOT DETECTED NOT DETECTED Final   Candida glabrata NOT DETECTED NOT DETECTED Final   Candida krusei NOT DETECTED NOT DETECTED Final   Candida parapsilosis NOT DETECTED NOT DETECTED Final   Candida tropicalis NOT DETECTED NOT DETECTED Final  Blood culture (routine x 2)     Status: None (Preliminary result)   Collection Time: 03/02/2017 12:19 PM  Result Value Ref Range Status   Specimen Description BLOOD RIGHT HAND  Final   Special Requests   Final    BOTTLES DRAWN AEROBIC AND ANAEROBIC Blood Culture adequate volume   Culture NO GROWTH 2 DAYS  Final   Report Status PENDING  Incomplete  MRSA PCR Screening     Status: None    Collection Time: 02/23/2017  4:35 PM  Result Value Ref Range Status   MRSA by PCR NEGATIVE NEGATIVE Final    Comment:        The GeneXpert MRSA Assay (FDA approved for NASAL specimens only), is one component of a comprehensive MRSA colonization surveillance program. It is not intended to diagnose MRSA infection nor to guide or monitor treatment for MRSA infections.   Culture, respiratory (NON-Expectorated)     Status: None (Preliminary result)   Collection Time: 03/02/17  2:31 PM  Result Value Ref Range Status   Specimen Description TRACHEAL ASPIRATE  Final   Special Requests NONE  Final   Gram Stain   Final    ABUNDANT WBC PRESENT, PREDOMINANTLY PMN RARE SQUAMOUS EPITHELIAL CELLS PRESENT FEW GRAM POSITIVE COCCI IN PAIRS IN CLUSTERS RARE GRAM NEGATIVE RODS    Culture   Final    FEW ACINETOBACTER CALCOACETICUS/BAUMANNII COMPLEX SUSCEPTIBILITIES TO FOLLOW    Report Status PENDING  Incomplete  Culture, blood (routine x 2)     Status: None (Preliminary result)   Collection Time: 03/02/17  7:50 PM  Result Value Ref Range Status   Specimen Description BLOOD RIGHT ANTECUBITAL  Final   Special Requests IN PEDIATRIC BOTTLE Blood Culture adequate volume  Final   Culture NO GROWTH < 24 HOURS  Final   Report Status  PENDING  Incomplete  Culture, blood (routine x 2)     Status: None (Preliminary result)   Collection Time: 03/02/17  8:12 PM  Result Value Ref Range Status   Specimen Description BLOOD LEFT ANTECUBITAL  Final   Special Requests IN PEDIATRIC BOTTLE Blood Culture adequate volume  Final   Culture NO GROWTH < 24 HOURS  Final   Report Status PENDING  Incomplete    Anti-infectives:  Anti-infectives    Start     Dose/Rate Route Frequency Ordered Stop   03/03/17 1000  vancomycin (VANCOCIN) IVPB 1000 mg/200 mL premix  Status:  Discontinued     1,000 mg 200 mL/hr over 60 Minutes Intravenous  Once 03/02/17 1442 03/02/17 1619   03/12/2017 1200  vancomycin (VANCOCIN) 1,500 mg in  sodium chloride 0.9 % 500 mL IVPB     1,500 mg 250 mL/hr over 120 Minutes Intravenous  Once 02/25/2017 1124 03/02/2017 1507   02/14/2017 1200  piperacillin-tazobactam (ZOSYN) IVPB 2.25 g     2.25 g 100 mL/hr over 30 Minutes Intravenous Every 8 hours 02/12/2017 1124     03/05/2017 1115  piperacillin-tazobactam (ZOSYN) IVPB 3.375 g  Status:  Discontinued     3.375 g 100 mL/hr over 30 Minutes Intravenous  Once 03/08/2017 1106 02/27/2017 1124   03/10/2017 1115  vancomycin (VANCOCIN) IVPB 1000 mg/200 mL premix  Status:  Discontinued     1,000 mg 200 mL/hr over 60 Minutes Intravenous  Once 02/21/2017 1106 03/11/2017 1124      Best Practice/Protocols:  VTE Prophylaxis: Heparin (SQ) GI Prophylaxis: Proton Pump Inhibitor No sedation  Consults: Treatment Team:  Roney Jaffe, MD    Events:  Subjective:    Overnight Issues: No new issues overnight.  He is weanin on the ventilator this morning, but not responding at all.  Objective:  Vital signs for last 24 hours: Temp:  [94.8 F (34.9 C)-99 F (37.2 C)] 97.5 F (36.4 C) (10/22 0800) Pulse Rate:  [53-66] 64 (10/22 0800) Resp:  [14-27] 14 (10/22 0800) BP: (117-142)/(50-66) 142/61 (10/22 0700) SpO2:  [99 %-100 %] 100 % (10/22 0700) Arterial Line BP: (115-163)/(42-57) 163/49 (10/22 0700) FiO2 (%):  [40 %] 40 % (10/22 0800) Weight:  [85.8 kg (189 lb 2.5 oz)-87.1 kg (192 lb 0.3 oz)] 87.1 kg (192 lb 0.3 oz) (10/22 0500)  Hemodynamic parameters for last 24 hours: CVP:  [4 mmHg-16 mmHg] 4 mmHg  Intake/Output from previous day: 10/21 0701 - 10/22 0700 In: 4660 [I.V.:4510; IV Piggyback:150] Out: 2426 [Urine:1575]  Intake/Output this shift: No intake/output data recorded.  Vent settings for last 24 hours: Vent Mode: PRVC FiO2 (%):  [40 %] 40 % Set Rate:  [14 bmp] 14 bmp Vt Set:  [540 mL] 540 mL PEEP:  [5 cmH20] 5 cmH20 Pressure Support:  [5 cmH20] 5 cmH20 Plateau Pressure:  [17 cmH20-19 cmH20] 18 cmH20  Physical Exam:  General: no  respiratory distress and no apparent distress Neuro: nonfocal exam, RASS -3 or deeper and comatose HEENT/Neck: apparently loose teeth in the back of his mouth and maybe one tooth out in his throat. Resp: clear to auscultation bilaterally CVS: regular rate and rhythm, S1, S2 normal, no murmur, click, rub or gallop GI: soft, nontender, BS WNL, no r/g and Not getting any tube feedings yet.  Haivng diarrhea. Extremities: edema 3+ and all extremities are swollen and so are his genitals  Results for orders placed or performed during the hospital encounter of 02/18/2017 (from the past 24 hour(s))  Glucose, capillary  Status: Abnormal   Collection Time: 03/03/17  8:46 AM  Result Value Ref Range   Glucose-Capillary 272 (H) 65 - 99 mg/dL  CBC with Differential/Platelet     Status: Abnormal   Collection Time: 03/03/17  8:52 AM  Result Value Ref Range   WBC 22.5 (H) 4.0 - 10.5 K/uL   RBC 3.78 (L) 4.22 - 5.81 MIL/uL   Hemoglobin 9.5 (L) 13.0 - 17.0 g/dL   HCT 29.6 (L) 39.0 - 52.0 %   MCV 78.3 78.0 - 100.0 fL   MCH 25.1 (L) 26.0 - 34.0 pg   MCHC 32.1 30.0 - 36.0 g/dL   RDW 20.0 (H) 11.5 - 15.5 %   Platelets 299 150 - 400 K/uL   Neutrophils Relative % 89 %   Lymphocytes Relative 5 %   Monocytes Relative 6 %   Eosinophils Relative 0 %   Basophils Relative 0 %   Neutro Abs 20.0 (H) 1.7 - 7.7 K/uL   Lymphs Abs 1.1 0.7 - 4.0 K/uL   Monocytes Absolute 1.4 (H) 0.1 - 1.0 K/uL   Eosinophils Absolute 0.0 0.0 - 0.7 K/uL   Basophils Absolute 0.0 0.0 - 0.1 K/uL   RBC Morphology POLYCHROMASIA PRESENT   Lactic acid, plasma     Status: Abnormal   Collection Time: 03/03/17  8:53 AM  Result Value Ref Range   Lactic Acid, Venous 3.8 (HH) 0.5 - 1.9 mmol/L  Glucose, capillary     Status: Abnormal   Collection Time: 03/03/17 11:59 AM  Result Value Ref Range   Glucose-Capillary 216 (H) 65 - 99 mg/dL  Lactic acid, plasma     Status: Abnormal   Collection Time: 03/03/17 12:47 PM  Result Value Ref Range    Lactic Acid, Venous 4.3 (HH) 0.5 - 1.9 mmol/L  Glucose, capillary     Status: Abnormal   Collection Time: 03/03/17  3:35 PM  Result Value Ref Range   Glucose-Capillary 155 (H) 65 - 99 mg/dL  Lipase, blood     Status: Abnormal   Collection Time: 03/03/17  3:44 PM  Result Value Ref Range   Lipase 85 (H) 11 - 51 U/L  Glucose, capillary     Status: Abnormal   Collection Time: 03/03/17  7:53 PM  Result Value Ref Range   Glucose-Capillary 118 (H) 65 - 99 mg/dL  Glucose, capillary     Status: Abnormal   Collection Time: 03/03/17 11:44 PM  Result Value Ref Range   Glucose-Capillary 151 (H) 65 - 99 mg/dL  Blood gas, arterial     Status: Abnormal   Collection Time: 03/04/17  3:30 AM  Result Value Ref Range   FIO2 40.00    Delivery systems VENTILATOR    Mode PRESSURE REGULATED VOLUME CONTROL    VT 540 mL   LHR 14 resp/min   Peep/cpap 5.0 cm H20   pH, Arterial 7.546 (H) 7.350 - 7.450   pCO2 arterial 24.1 (L) 32.0 - 48.0 mmHg   pO2, Arterial 201 (H) 83.0 - 108.0 mmHg   Bicarbonate 21.0 20.0 - 28.0 mmol/L   Acid-base deficit 1.4 0.0 - 2.0 mmol/L   O2 Saturation 99.7 %   Patient temperature 97.0    Collection site A-LINE    Drawn by [phone]    Sample type ARTERIAL DRAW   Glucose, capillary     Status: Abnormal   Collection Time: 03/04/17  3:34 AM  Result Value Ref Range   Glucose-Capillary 166 (H) 65 - 99 mg/dL  Comprehensive metabolic  panel     Status: Abnormal   Collection Time: 03/04/17  4:35 AM  Result Value Ref Range   Sodium 136 135 - 145 mmol/L   Potassium 3.4 (L) 3.5 - 5.1 mmol/L   Chloride 103 101 - 111 mmol/L   CO2 21 (L) 22 - 32 mmol/L   Glucose, Bld 173 (H) 65 - 99 mg/dL   BUN 142 (H) 6 - 20 mg/dL   Creatinine, Ser 3.48 (H) 0.61 - 1.24 mg/dL   Calcium 6.8 (L) 8.9 - 10.3 mg/dL   Total Protein 6.1 (L) 6.5 - 8.1 g/dL   Albumin 1.4 (L) 3.5 - 5.0 g/dL   AST 298 (H) 15 - 41 U/L   ALT 78 (H) 17 - 63 U/L   Alkaline Phosphatase 682 (H) 38 - 126 U/L   Total Bilirubin 2.2 (H)  0.3 - 1.2 mg/dL   GFR calc non Af Amer 16 (L) >60 mL/min   GFR calc Af Amer 19 (L) >60 mL/min   Anion gap 12 5 - 15  Magnesium     Status: Abnormal   Collection Time: 03/04/17  4:35 AM  Result Value Ref Range   Magnesium 2.8 (H) 1.7 - 2.4 mg/dL  Phosphorus     Status: Abnormal   Collection Time: 03/04/17  4:35 AM  Result Value Ref Range   Phosphorus 4.9 (H) 2.5 - 4.6 mg/dL  CBC with Differential/Platelet     Status: Abnormal   Collection Time: 03/04/17  4:35 AM  Result Value Ref Range   WBC 20.0 (H) 4.0 - 10.5 K/uL   RBC 3.71 (L) 4.22 - 5.81 MIL/uL   Hemoglobin 9.4 (L) 13.0 - 17.0 g/dL   HCT 28.3 (L) 39.0 - 52.0 %   MCV 76.3 (L) 78.0 - 100.0 fL   MCH 25.3 (L) 26.0 - 34.0 pg   MCHC 33.2 30.0 - 36.0 g/dL   RDW 19.7 (H) 11.5 - 15.5 %   Platelets 211 150 - 400 K/uL   Neutrophils Relative % 88 %   Neutro Abs 17.6 (H) 1.7 - 7.7 K/uL   Lymphocytes Relative 4 %   Lymphs Abs 0.8 0.7 - 4.0 K/uL   Monocytes Relative 7 %   Monocytes Absolute 1.5 (H) 0.1 - 1.0 K/uL   Eosinophils Relative 0 %   Eosinophils Absolute 0.1 0.0 - 0.7 K/uL   Basophils Relative 0 %   Basophils Absolute 0.0 0.0 - 0.1 K/uL  Lactic acid, plasma     Status: Abnormal   Collection Time: 03/04/17  4:35 AM  Result Value Ref Range   Lactic Acid, Venous 4.4 (HH) 0.5 - 1.9 mmol/L  Ammonia     Status: Abnormal   Collection Time: 03/04/17  4:35 AM  Result Value Ref Range   Ammonia 72 (H) 9 - 35 umol/L  Glucose, capillary     Status: Abnormal   Collection Time: 03/04/17  8:18 AM  Result Value Ref Range   Glucose-Capillary 158 (H) 65 - 99 mg/dL   Comment 1 Notify RN    Comment 2 Document in Chart      Assessment/Plan:   NEURO  Altered Mental Status:  coma   Plan: Patient not awakening at all.  PULM  Weaning on the ventilator and doing fairly well.   Plan: No plans to extubate until we have a plan for moving forward.  CARDIO  No cardiac issues   Plan: CPM  RENAL  Oliguria (suspect ATN) Metabolic Acidosis  (from renal failure and  uremia) and Respiratory Alkalosis (compensatory) Actue Renal Failure (due to rhabdomyolysis) Hypervolemia   Plan: Urine output has picked up and BUN/creatinine are improving.  GI  No specific issues   Plan: COnsider tube feedings   ID  No known infectious sources  A few Acinetobacter in sputum   Plan: COntinue on Zosyn  HEME  Anemia acute blood loss anemia and anemia of critical illness)   Plan: No transfusions are necessary  ENDO No known issues   Plan: CPM  Global Issues  Patient is improving physiologically, but not neurologically.  His rhabdomyolysis is clearing and BUN/creatinine are coming down.  He is weaning on the ventilator.  But I do not believe that he will be extubated soon.  He does not appear to be a true trauma patient.  I believe that he was found down and most of his problems are from either sepsis or the rhabdomyolysis    LOS: 3 days   Additional comments:I reviewed the patient's new clinical lab test results. cbc/bmet and I reviewed the patients new imaging test results. CXR  Critical Care Total Time*: 30 Minutes  Uri Turnbough 03/04/2017  *Care during the described time interval was provided by me and/or other providers on the critical care team.  I have reviewed this patient's available data, including medical history, events of note, physical examination and test results as part of my evaluation.

## 2017-03-04 NOTE — Progress Notes (Signed)
Subjective: Interval History: on vent does not awaken to stimulus.  Objective: Vital signs in last 24 hours: Temp:  [95.5 F (35.3 C)-99 F (37.2 C)] 97.5 F (36.4 C) (10/22 0800) Pulse Rate:  [53-66] 64 (10/22 0800) Resp:  [14-27] 14 (10/22 0800) BP: (117-142)/(50-65) 142/61 (10/22 0700) SpO2:  [99 %-100 %] 100 % (10/22 0700) Arterial Line BP: (115-163)/(42-60) 156/60 (10/22 0800) FiO2 (%):  [40 %] 40 % (10/22 0800) Weight:  [85.8 kg (189 lb 2.5 oz)-87.1 kg (192 lb 0.3 oz)] 87.1 kg (192 lb 0.3 oz) (10/22 0500) Weight change:   Intake/Output from previous day: 10/21 0701 - 10/22 0700 In: 4660 [I.V.:4510; IV Piggyback:150] Out: 1600 [Urine:1600] Intake/Output this shift: Total I/O In: -  Out: 200 [Urine:200]  General appearance: no distress and vent, not responsive to verbal stim Resp: diminished breath sounds bibasilar, rales bibasilar and rhonchi bibasilar Cardio: S1, S2 normal and systolic murmur: systolic ejection 2/6, decrescendo at 2nd left intercostal space GI: soft, non-tender; bowel sounds normal; no masses,  no organomegaly Extremities: edema 2-3+  Lab Results:  Recent Labs  03/03/17 0852 03/04/17 0435  WBC 22.5* 20.0*  HGB 9.5* 9.4*  HCT 29.6* 28.3*  PLT 299 211   BMET:  Recent Labs  03/03/17 0500 03/04/17 0435  NA 140 136  K 4.6 3.4*  CL 107 103  CO2 18* 21*  GLUCOSE 292* 173*  BUN 165* 142*  CREATININE 3.79* 3.48*  CALCIUM 7.1* 6.8*   No results for input(s): PTH in the last 72 hours. Iron Studies: No results for input(s): IRON, TIBC, TRANSFERRIN, FERRITIN in the last 72 hours.  Studies/Results: Mr Brain Wo Contrast  Result Date: 03/03/2017 CLINICAL DATA:  71 y/o M; altered level of consciousness on ventilator. EXAM: MRI HEAD WITHOUT CONTRAST TECHNIQUE: Multiplanar, multiecho pulse sequences of the brain and surrounding structures were obtained without intravenous contrast. COMPARISON:  03/09/2017 CT head FINDINGS: Brain: Diffuse  predominantly cortical reduced diffusion in in the right cerebral hemisphere with relative sparing of the occipital lobe. Additionally there is reduced diffusion in the right lateral putamen, right globus pallidus, and right thalamus. Faint reduced diffusion is present within the left lateral thalamus, left posterior limb of internal capsule, and splenium of corpus callosum. No signal abnormality of brainstem or cerebellum identified. Areas of reduced diffusion demonstrate T2 FLAIR hyperintense signal abnormality and mild local mass effect. No susceptibility hypointensity to indicate intracranial hemorrhage. No extra-axial collection, effacement of basilar cisterns, midline shift, or herniation identified. Vascular: Normal flow voids. Skull and upper cervical spine: Interval diminution in soft tissue swelling of the right convexity scalp from prior CT. Sinuses/Orbits: Partial paranasal sinus and mastoid opacification is likely due to intubation and nasoenteric tube. Orbits are unremarkable. Other: None. IMPRESSION: Cortical and basal ganglia reduced diffusion and edema throughout the right cerebral hemisphere with relative sparing of the right occipital lobe. All vascular territories are involved. No hemorrhage or significant mass effect. The pattern of signal abnormality could be seen with hypoxic ischemic injury, seizure activity, or infectious/inflammatory/autoimmune encephalitis. Near complete unilateral abnormality favors seizure activity, however, underlying hypoxic insult or encephalitis with secondary seizure is certainly possible. Electronically Signed   By: Kristine Garbe M.D.   On: 03/03/2017 22:25   Dg Chest Port 1 View  Result Date: 03/04/2017 CLINICAL DATA:  Hypoxia EXAM: PORTABLE CHEST 1 VIEW COMPARISON:  March 03, 2017 FINDINGS: Endotracheal tube tip is 4.3 cm above the carina. Central catheter tip is in the superior vena cava. Nasogastric tube tip and  side port below the diaphragm.  No pneumothorax. No edema or consolidation. Heart size and pulmonary vascularity are normal. No adenopathy. No evident bone lesions. IMPRESSION: Tube and catheter positions as described without pneumothorax. No edema or consolidation. Electronically Signed   By: Lowella Grip III M.D.   On: 03/04/2017 07:38   Portable Chest Xray  Result Date: 03/03/2017 CLINICAL DATA:  Acute respiratory failure with hypoxia. EXAM: PORTABLE CHEST 1 VIEW COMPARISON:  03/02/2017. FINDINGS: Stable and normal cardiomediastinal silhouette. Support tubes and lines are unchanged. No edema or consolidation.  No pneumothorax. IMPRESSION: Stable chest without acute findings. Electronically Signed   By: Staci Righter M.D.   On: 03/03/2017 07:26   Dg Chest Port 1 View  Result Date: 03/02/2017 CLINICAL DATA:  Sinus bradycardia. Hypotension. Acute renal failure. Central line placement. EXAM: PORTABLE CHEST 1 VIEW COMPARISON:  03/02/2017 FINDINGS: A new right jugular central venous catheter is seen, with tip overlying the superior cavoatrial junction. Endotracheal tube and nasogastric tube remain in appropriate position. No pneumothorax visualized. Heart size and mediastinal contours are normal. Both lungs are clear. IMPRESSION: New right jugular central venous catheter in appropriate position. No evidence of pneumothorax. No active lung disease. Electronically Signed   By: Earle Gell M.D.   On: 03/02/2017 17:21    I have reviewed the patient's current medications.  Assessment/Plan: 1 AKI presumed acute.  Now nonoliuguric. Vol xs and bp ^, stop iv bicarb.  No signif change solute. Ischemic with low bp and ACEI 2 ^^LFTS,  ? Hepatitis 3 VDRF mental status ? Sz, vs substance vs hypoxic injury. N o evidence pneu 4 Sz 5 AMS 6 Nutrition TF P stop ivf with bicarb, use po,  Allow to diurese,  Vent support, TF    LOS: 3 days   Jamirra Curnow L 03/04/2017,11:11 AM

## 2017-03-04 NOTE — Procedures (Signed)
ELECTROENCEPHALOGRAM REPORT  Date of Study: 03/04/2017  Patient's Name: Dennis [name] MRN: [id number] Date of Birth: [birth year]  Referring Provider: Dr. Roland Rack  Clinical History: This is a 71 year old man with altered mental status, twitching.  Medications: levETIRAcetam (KEPPRA) 500 mg in sodium chloride 0.9 % 100 mL IVPB  lacosamide (VIMPAT) 100 mg in sodium chloride 0.9 % 25 mL IVPB  albuterol (PROVENTIL) (2.5 MG/3ML) 0.083% nebulizer solution 2.5 mg  folic acid (FOLVITE) tablet 1 mg  insulin aspart (novoLOG) injection 0-20 Units  lactulose (CHRONULAC) 10 GM/15ML solution 30 g  lactulose (CHRONULAC) 10 GM/15ML solution 30 g  multivitamin liquid 15 mL  norepinephrine (LEVOPHED) 4 mg in dextrose 5 % 250 mL (0.016 mg/mL) infusion  pantoprazole sodium (PROTONIX) 40 mg/20 mL oral suspension 40 mg  piperacillin-tazobactam (ZOSYN) IVPB 3.375 g  silver sulfADIAZINE (SILVADENE) 1 % cream  thiamine (VITAMIN B-1) tablet 100 mg   Technical Summary: A multichannel digital EEG recording measured by the international 10-20 system with electrodes applied with paste and impedances below 5000 ohms performed as portable with EKG monitoring in an intubated and unresponsive patient.  Hyperventilation and photic stimulation were not performed.  The digital EEG was referentially recorded, reformatted, and digitally filtered in a variety of bipolar and referential montages for optimal display.   Description: The patient is intubated and unresponsive during the recording. No sedating medications listed. There is no clear posterior dominant rhythm. The background consists of a large amount of diffuse low to medium voltage 4-5 Hz theta and 2-3 Hz delta slowing. There are lateralized periodic discharges occurring every 1-2 seconds  over the right hemisphere, maximal over the right posterior temporal region, with occasional spread to the left posterior quadrant. There is no evolution in frequency  or amplitude seen. On transverse montage, the periodic discharges appear over the vertex region, but do not have the appearance of normal sleep architecture. There is increase in muscle artifact with noxious stimulation. No electrographic seizures seen.  EKG lead was unremarkable.  Impression: This EEG is abnormal due to the presence of: 1. Moderate diffuse background slowing 2. Lateralized periodic discharges over the right hemisphere, maximal over the right posterior temporal region  Clinical Correlation of the above findings indicates diffuse cerebral dysfunction that is non-specific in etiology and can be seen with hypoxic/ischemic injury, toxic/metabolic encephalopathies, or medication effect.  Although lateralized periodic discharges do not represent ongoing seizure activity, they are commonly associated with an acute brain lesion and clinical focal seizures, or post-ictal after focal status epilepticus. There were no electrographic seizures in this study. Clinical correlation is advised.   Ellouise Newer, M.D.
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
## 2017-03-04 NOTE — Progress Notes (Signed)
[facility] Progress Note Patient Name: Dennis [name] DOB: March 22, 1946 MRN: [id number]   Date of Service  03/04/2017  HPI/Events of Note  LA still elevated at 4.4  eICU Interventions  Repeat 1 lt NS bolus     Intervention Category Evaluation Type: Other  Korayma Hagwood 03/04/2017, 5:52 AM

## 2017-03-04 NOTE — Progress Notes (Signed)
EEG completed, results pending. 

## 2017-03-04 NOTE — Progress Notes (Signed)
Subjective: No changes  Exam: Vitals:   03/04/17 0700 03/04/17 0800  BP: (!) 142/61   Pulse: 61 64  Resp: 19 14  Temp:  (!) 97.5 F (36.4 C)  SpO2: 100%    Gen: In bed, NAD Resp: non-labored breathing, no acute distress Abd: soft, nt  Neuro: MS: Opens eyes partially to noxious stimulus, does not follow commands.  ID:POEUM, corneals present, but appears weaker on right.  Motor: no reaction to nox stim.  Sensory:as above.   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 The EEG changes(PLEDs proper) I suspect are likley to represent "burned out" status epilepticus. Given that the distribution does closely mirror the MCA territory, I would favor getting an MRA as well, though these changes could be solely due to seizure.   Recommendations: 1) LP for cells, glucose, protein, HSV 2) add vimpat 200mg  x1, then 100mg  BID.  3) repeat EEG 4) MRA head  Roland Rack, MD [facility] [phone]  If 7pm- 7am, please page neurology on call as listed in [place].

## 2017-03-04 NOTE — Procedures (Signed)
Indication: Altered mental status, seizure activity, new injury on brain imaging  Due to the patient's mental status the risk and benefit of the procedure could not be discussed. After unsuccessful attempts to contact a proxy the decision was made to perform the diagnostic procedure to guide management for his critical illness due to harm of delay in potential therapy. The case was discussed with Dr. Halford Chessman, primary attending.  The patient was prepped and draped, and using sterile technique a 20 gauge quinke spinal needle was inserted in the L4-L5 space. The opening pressure was 68mmH2O. Approximately 12 cc of CSF were obtained and sent for analysis. Labs including CSF culture, cell count, protein, glucose, HSV DNA.  Dr. Leonel Ramsay was present throughout the procedure.  Collier Salina, MD PGY-III Internal Medicine Resident Pager# [phone] 03/04/2017, 2:58 PM

## 2017-03-04 NOTE — Clinical Social Work Note (Signed)
Clinical Social Worker received notification from RN that patient family still has not yet been identified.  CSW explored [employer] and the only relatives CSW can locate for patient is wife and for patient wife, patient is the only listed relative.  Patient wife is deceased and therefore unable to locate any living family members.  CSW updated RN and will further discuss with supervisor about plans moving forward.  Barbette Or, [place]
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
## 2017-03-05 ENCOUNTER — Inpatient Hospital Stay (HOSPITAL_COMMUNITY): Payer: Medicare Other

## 2017-03-05 LAB — GLUCOSE, CAPILLARY
GLUCOSE-CAPILLARY: 196 mg/dL — AB (ref 65–99)
GLUCOSE-CAPILLARY: 189 mg/dL — AB (ref 65–99)
GLUCOSE-CAPILLARY: 182 mg/dL — AB (ref 65–99)
GLUCOSE-CAPILLARY: 149 mg/dL — AB (ref 65–99)
Glucose-Capillary: 186 mg/dL — ABNORMAL HIGH (ref 65–99)
Glucose-Capillary: 194 mg/dL — ABNORMAL HIGH (ref 65–99)

## 2017-03-05 LAB — COMPREHENSIVE METABOLIC PANEL
ALT: 73 U/L — AB (ref 17–63)
AST: 216 U/L — AB (ref 15–41)
Albumin: 1.3 g/dL — ABNORMAL LOW (ref 3.5–5.0)
Alkaline Phosphatase: 565 U/L — ABNORMAL HIGH (ref 38–126)
Anion gap: 15 (ref 5–15)
BILIRUBIN TOTAL: 2.6 mg/dL — AB (ref 0.3–1.2)
BUN: 135 mg/dL — AB (ref 6–20)
CALCIUM: 6.7 mg/dL — AB (ref 8.9–10.3)
CHLORIDE: 101 mmol/L (ref 101–111)
CO2: 20 mmol/L — ABNORMAL LOW (ref 22–32)
CREATININE: 3.25 mg/dL — AB (ref 0.61–1.24)
GFR, EST AFRICAN AMERICAN: 21 mL/min — AB (ref >60)
GFR, EST NON AFRICAN AMERICAN: 18 mL/min — AB (ref >60)
Glucose, Bld: 226 mg/dL — ABNORMAL HIGH (ref 65–99)
Potassium: 3.1 mmol/L — ABNORMAL LOW (ref 3.5–5.1)
Sodium: 136 mmol/L (ref 135–145)
TOTAL PROTEIN: 5.8 g/dL — AB (ref 6.5–8.1)

## 2017-03-05 LAB — CBC WITH DIFFERENTIAL/PLATELET
Basophils Absolute: 0 10*3/uL (ref 0.0–0.1)
Basophils Relative: 0 %
EOS PCT: 0 %
Eosinophils Absolute: 0 10*3/uL (ref 0.0–0.7)
HCT: 28.2 % — ABNORMAL LOW (ref 39.0–52.0)
HEMOGLOBIN: 9 g/dL — AB (ref 13.0–17.0)
LYMPHS ABS: 0.7 10*3/uL (ref 0.7–4.0)
LYMPHS PCT: 5 %
MCH: 24.3 pg — AB (ref 26.0–34.0)
MCHC: 31.9 g/dL (ref 30.0–36.0)
MCV: 76 fL — AB (ref 78.0–100.0)
MONOS PCT: 5 %
Monocytes Absolute: 0.7 10*3/uL (ref 0.1–1.0)
NEUTROS PCT: 89 %
Neutro Abs: 12.7 10*3/uL — ABNORMAL HIGH (ref 1.7–7.7)
Platelets: 156 10*3/uL (ref 150–400)
RBC: 3.71 MIL/uL — ABNORMAL LOW (ref 4.22–5.81)
RDW: 19.1 % — ABNORMAL HIGH (ref 11.5–15.5)
WBC: 14.2 10*3/uL — ABNORMAL HIGH (ref 4.0–10.5)

## 2017-03-05 LAB — HERPES SIMPLEX VIRUS(HSV) DNA BY PCR
HSV 1 DNA: NEGATIVE
HSV 2 DNA: NEGATIVE

## 2017-03-05 LAB — MAGNESIUM: MAGNESIUM: 2.7 mg/dL — AB (ref 1.7–2.4)

## 2017-03-05 LAB — LACTIC ACID, PLASMA: Lactic Acid, Venous: 3 mmol/L (ref 0.5–1.9)

## 2017-03-05 LAB — PHOSPHORUS: PHOSPHORUS: 6.3 mg/dL — AB (ref 2.5–4.6)

## 2017-03-05 LAB — AMMONIA: AMMONIA: 86 umol/L — AB (ref 9–35)

## 2017-03-05 MED ORDER — HEPARIN SODIUM (PORCINE) 5000 UNIT/ML IJ SOLN
5000.0000 [IU] | Freq: Three times a day (TID) | INTRAMUSCULAR | Status: DC
  Administered 2017-03-05 – 2017-03-10 (×16): 5000 [IU] via SUBCUTANEOUS
  Filled 2017-03-05 (×16): qty 1

## 2017-03-05 MED ORDER — SODIUM CHLORIDE 0.9 % IV SOLN
200.0000 mg | Freq: Two times a day (BID) | INTRAVENOUS | Status: DC
  Administered 2017-03-05 – 2017-03-13 (×16): 200 mg via INTRAVENOUS
  Filled 2017-03-05 (×23): qty 20

## 2017-03-05 MED ORDER — PHENYTOIN SODIUM 50 MG/ML IJ SOLN
100.0000 mg | Freq: Three times a day (TID) | INTRAMUSCULAR | Status: DC
  Administered 2017-03-05 – 2017-03-11 (×17): 100 mg via INTRAVENOUS
  Filled 2017-03-05 (×17): qty 2

## 2017-03-05 MED ORDER — POTASSIUM CHLORIDE 20 MEQ/15ML (10%) PO SOLN
40.0000 meq | Freq: Once | ORAL | Status: AC
  Administered 2017-03-05: 40 meq
  Filled 2017-03-05: qty 30

## 2017-03-05 MED ORDER — SODIUM CHLORIDE 0.9 % IV SOLN
1000.0000 mg | Freq: Once | INTRAVENOUS | Status: AC
  Administered 2017-03-05: 1000 mg via INTRAVENOUS
  Filled 2017-03-05: qty 20

## 2017-03-05 NOTE — Progress Notes (Signed)
Subjective: CSF showed elevated protein of unclear etiology.  Exam: Vitals:   03/05/17 1845 03/05/17 1900  BP: 131/63 130/67  Pulse: 64 64  Resp: (!) 22 15  Temp:    SpO2:     Gen: In bed, NAD Resp: non-labored breathing, no acute distress Abd: soft, nt  Neuro: MS: Opens eyes partially to noxious stimulus, does not follow commands.  NA:TFTDD Motor: He may have a flicker of flexion to noxious stimulation in the upper extremities, no movement in the lowers Sensory:as above.   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 The EEG changes(PLEDs proper) I suspect could represent "burned out" status epilepticus.   His MRI changes appear very correlated with vascular territory, though not perfectly going along with them.  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  He has continued periodic discharges on EEG.  Recommendations: 1) LP for cells, glucose, protein, HSV 2) continue Keppra, renally dosed, continue Vimpat 200 mg twice daily, add phenytoin 3) overnight EEG to assess for partial seizures  Roland Rack, MD [facility] [phone]  If 7pm- 7am, please page neurology on call as listed in [place].
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
## 2017-03-05 NOTE — Plan of Care (Signed)
Problem: Nutrition: Goal: Adequate nutrition will be maintained Outcome: Progressing Patient on tube feeds Nepro carb steady 1.8 @ 35 ml/hr as well as Prostat 60 ml bid.

## 2017-03-05 NOTE — Progress Notes (Signed)
Subjective: Interval History: nonresponsive.  Objective: Vital signs in last 24 hours: Temp:  [96.2 F (35.7 C)-98.1 F (36.7 C)] 97.1 F (36.2 C) (10/23 0800) Pulse Rate:  [58-74] 72 (10/23 1101) Resp:  [14-28] 18 (10/23 1101) BP: (90-137)/(37-68) 134/58 (10/23 1101) SpO2:  [100 %] 100 % (10/23 1101) Arterial Line BP: (83-138)/(39-53) 101/48 (10/23 0800) FiO2 (%):  [30 %-40 %] 30 % (10/23 1101) Weight:  [77 kg (169 lb 12.1 oz)] 77 kg (169 lb 12.1 oz) (10/23 0422) Weight change: -8.8 kg (-19 lb 6.4 oz)  Intake/Output from previous day: 10/22 0701 - 10/23 0700 In: 2189.8 [I.V.:1011.5; NG/GT:468.3; IV Piggyback:710] Out: 1510 [Urine:1510] Intake/Output this shift: No intake/output data recorded.  General appearance: no response Resp: rales bibasilar Cardio: S1, S2 normal and systolic murmur: holosystolic 2/6, blowing at apex GI: pos bs , soft, liver down 5 cm Extremities: edema 3+  Lab Results:  Recent Labs  03/04/17 0435 03/05/17 0514  WBC 20.0* 14.2*  HGB 9.4* 9.0*  HCT 28.3* 28.2*  PLT 211 156   BMET:  Recent Labs  03/04/17 0435 03/05/17 0514  NA 136 136  K 3.4* 3.1*  CL 103 101  CO2 21* 20*  GLUCOSE 173* 226*  BUN 142* 135*  CREATININE 3.48* 3.25*  CALCIUM 6.8* 6.7*   No results for input(s): PTH in the last 72 hours. Iron Studies: No results for input(s): IRON, TIBC, TRANSFERRIN, FERRITIN in the last 72 hours.  Studies/Results: Mr Virgel Paling [place] Contrast  Result Date: 03/05/2017 CLINICAL DATA:  71 y/o  M; new onset refractory status epilepticus. EXAM: MRA HEAD WITHOUT CONTRAST MRA NECK WITHOUT CONTRAST TECHNIQUE: Angiographic images of the Circle of Willis were obtained using MRA technique without intravenous contrast. Angiographic images of the neck were obtained using MRA technique without intravenous contrast. Carotid stenosis measurements (when applicable) are obtained utilizing NASCET criteria, using the distal internal carotid diameter as the  denominator. COMPARISON:  None. FINDINGS: MRA HEAD FINDINGS Internal carotid arteries:  Patent. Anterior cerebral arteries:  Patent. Middle cerebral arteries: Patent. Left M2 superior division branch proximal short segment of mild stenosis. Anterior communicating artery: Patent. Posterior communicating arteries: Not identified, likely hypoplastic or absent. Posterior cerebral arteries:  Patent. Basilar artery:  Patent. Vertebral arteries:  Patent. No evidence of high-grade stenosis, large vessel occlusion, or aneurysm unless noted above. MRA NECK FINDINGS Aortic arch: Patent. Right common carotid artery: Patent. Right internal carotid artery: Patent. Right vertebral artery: Patent. Left common carotid artery: Patent. Left Internal carotid artery: Patent. Moderate midcervical tortuosity. Left Vertebral artery: Patent. There is no evidence of hemodynamically significant stenosis by NASCET criteria, occlusion, or aneurysm unless noted above. IMPRESSION: 1. Patent carotid and vertebral arteries. No dissection, aneurysm, or hemodynamically significant stenosis utilizing NASCET criteria. 2. Patent circle of Willis. No large vessel occlusion, aneurysm, or high-grade stenosis. 3. Left M2 superior division branch proximal short segment of mild stenosis. Electronically Signed   By: Kristine Garbe M.D.   On: 03/05/2017 01:15   Mr Jodene Nam Neck Wo Contrast  Result Date: 03/05/2017 CLINICAL DATA:  71 y/o  M; new onset refractory status epilepticus. EXAM: MRA HEAD WITHOUT CONTRAST MRA NECK WITHOUT CONTRAST TECHNIQUE: Angiographic images of the Circle of Willis were obtained using MRA technique without intravenous contrast. Angiographic images of the neck were obtained using MRA technique without intravenous contrast. Carotid stenosis measurements (when applicable) are obtained utilizing NASCET criteria, using the distal internal carotid diameter as the denominator. COMPARISON:  None. FINDINGS: MRA HEAD FINDINGS Internal  carotid arteries:  Patent. Anterior cerebral arteries:  Patent. Middle cerebral arteries: Patent. Left M2 superior division branch proximal short segment of mild stenosis. Anterior communicating artery: Patent. Posterior communicating arteries: Not identified, likely hypoplastic or absent. Posterior cerebral arteries:  Patent. Basilar artery:  Patent. Vertebral arteries:  Patent. No evidence of high-grade stenosis, large vessel occlusion, or aneurysm unless noted above. MRA NECK FINDINGS Aortic arch: Patent. Right common carotid artery: Patent. Right internal carotid artery: Patent. Right vertebral artery: Patent. Left common carotid artery: Patent. Left Internal carotid artery: Patent. Moderate midcervical tortuosity. Left Vertebral artery: Patent. There is no evidence of hemodynamically significant stenosis by NASCET criteria, occlusion, or aneurysm unless noted above. IMPRESSION: 1. Patent carotid and vertebral arteries. No dissection, aneurysm, or hemodynamically significant stenosis utilizing NASCET criteria. 2. Patent circle of Willis. No large vessel occlusion, aneurysm, or high-grade stenosis. 3. Left M2 superior division branch proximal short segment of mild stenosis. Electronically Signed   By: Kristine Garbe M.D.   On: 03/05/2017 01:15   Mr Brain Wo Contrast  Result Date: 03/03/2017 CLINICAL DATA:  71 y/o M; altered level of consciousness on ventilator. EXAM: MRI HEAD WITHOUT CONTRAST TECHNIQUE: Multiplanar, multiecho pulse sequences of the brain and surrounding structures were obtained without intravenous contrast. COMPARISON:  03/08/2017 CT head FINDINGS: Brain: Diffuse predominantly cortical reduced diffusion in in the right cerebral hemisphere with relative sparing of the occipital lobe. Additionally there is reduced diffusion in the right lateral putamen, right globus pallidus, and right thalamus. Faint reduced diffusion is present within the left lateral thalamus, left posterior limb  of internal capsule, and splenium of corpus callosum. No signal abnormality of brainstem or cerebellum identified. Areas of reduced diffusion demonstrate T2 FLAIR hyperintense signal abnormality and mild local mass effect. No susceptibility hypointensity to indicate intracranial hemorrhage. No extra-axial collection, effacement of basilar cisterns, midline shift, or herniation identified. Vascular: Normal flow voids. Skull and upper cervical spine: Interval diminution in soft tissue swelling of the right convexity scalp from prior CT. Sinuses/Orbits: Partial paranasal sinus and mastoid opacification is likely due to intubation and nasoenteric tube. Orbits are unremarkable. Other: None. IMPRESSION: Cortical and basal ganglia reduced diffusion and edema throughout the right cerebral hemisphere with relative sparing of the right occipital lobe. All vascular territories are involved. No hemorrhage or significant mass effect. The pattern of signal abnormality could be seen with hypoxic ischemic injury, seizure activity, or infectious/inflammatory/autoimmune encephalitis. Near complete unilateral abnormality favors seizure activity, however, underlying hypoxic insult or encephalitis with secondary seizure is certainly possible. Electronically Signed   By: Kristine Garbe M.D.   On: 03/03/2017 22:25   Dg Chest Port 1 View  Result Date: 03/05/2017 CLINICAL DATA:  Respiratory failure EXAM: PORTABLE CHEST 1 VIEW COMPARISON:  03/04/2017 FINDINGS: Endotracheal tube in good position. NG tube in the stomach. Central venous catheter tip in the SVC unchanged. Lungs remain clear without infiltrate or effusion. IMPRESSION: Support lines in good position.  Lungs remain clear. Electronically Signed   By: Franchot Gallo M.D.   On: 03/05/2017 07:28   Dg Chest Port 1 View  Result Date: 03/04/2017 CLINICAL DATA:  Hypoxia EXAM: PORTABLE CHEST 1 VIEW COMPARISON:  March 03, 2017 FINDINGS: Endotracheal tube tip is 4.3 cm  above the carina. Central catheter tip is in the superior vena cava. Nasogastric tube tip and side port below the diaphragm. No pneumothorax. No edema or consolidation. Heart size and pulmonary vascularity are normal. No adenopathy. No evident bone lesions. IMPRESSION: Tube and catheter positions as  described without pneumothorax. No edema or consolidation. Electronically Signed   By: Lowella Grip III M.D.   On: 03/04/2017 07:38    I have reviewed the patient's current medications.  Assessment/Plan: 1 AKI slowly better, mild acidemia K low,.  Vol xs but should mobilize and is not hurting. 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 P allow to diuese, minimize fluids, EEG,  AB, Vent , TF for nutrition and fluid    LOS: 4 days   Maribell Demeo L 03/05/2017,11:16 AM

## 2017-03-05 NOTE — Progress Notes (Signed)
[facility] Progress Note Patient Name: LANGFORD CARIAS DOB: 01/06/1946 MRN: [id number]   Date of Service  03/05/2017  HPI/Events of Note    eICU Interventions  Hypokalemia -repleted      Intervention Category Intermediate Interventions: Electrolyte abnormality - evaluation and management  ALVA,RAKESH V. 03/05/2017, 6:17 AM

## 2017-03-05 NOTE — Plan of Care (Signed)
Problem: Physical Regulation: Goal: Will remain free from infection Outcome: Progressing Daily hygiene: chlorhexidine wipes (central line infection prevention), foley/peri care prn.

## 2017-03-05 NOTE — Progress Notes (Deleted)
EEG completed, results pending. 

## 2017-03-05 NOTE — Plan of Care (Signed)
Problem: Activity: Goal: Risk for activity intolerance will decrease Outcome: Not Applicable Date Met: 45/91/36 Pt is unconscious minus any sedation since intake. He is moved q2 and limbs are exercised however he is currently immobile.

## 2017-03-05 NOTE — Plan of Care (Signed)
Problem: Skin Integrity: Goal: Risk for impaired skin integrity will decrease Outcome: Progressing Keeping patient dry, turning q2 hours, foam over bony prominences for prophylaxis.

## 2017-03-05 NOTE — Progress Notes (Signed)
PULMONARY / CRITICAL CARE MEDICINE   Name: Dennis [name] MRN: [id number] DOB: 1946-01-15    ADMISSION DATE:  03/06/2017 CONSULTATION DATE:  03/02/2017  REFERRING MD:  Dr. Kieth Brightly, Trauma  CHIEF COMPLAINT:  Respiratory failure  HISTORY OF PRESENT ILLNESS:   71 yo male found unresponsive at home, hypothermic, VDRF, bradycardia, acute renal failure, seizure. PMHx of ETOH, HCV, Pancreatitis, DM, HTN, CKD 2.  SUBJECTIVE:  No change in mental status.  VITAL SIGNS: BP (!) 103/57 (BP Location: Right Arm)   Pulse 72   Temp (!) 97.1 F (36.2 C) (Axillary)   Resp 17   Ht 6' (1.829 m)   Wt 169 lb 12.1 oz (77 kg)   SpO2 100%   BMI 23.02 kg/m   HEMODYNAMICS: CVP:  [2 mmHg-13 mmHg] 9 mmHg  VENTILATOR SETTINGS: Vent Mode: PRVC FiO2 (%):  [40 %] 40 % Set Rate:  [14 bmp] 14 bmp Vt Set:  [540 mL] 540 mL PEEP:  [5 cmH20] 5 cmH20 Pressure Support:  [5 cmH20] 5 cmH20 Plateau Pressure:  [11 cmH20-17 cmH20] 17 cmH20  INTAKE / OUTPUT: I/O last 3 completed shifts: In: 4119.8 [I.V.:2991.5; NG/GT:468.3; IV Piggyback:660] Out: 2952 [Urine:2495]  PHYSICAL EXAMINATION:  General - unresponsive Eyes - pupils mid point ENT - ETT in place Cardiac - regular, no murmur Chest - no wheeze, rales Abd - soft, non tender Ext - no edema Skin - no rashes Neuro - not responding to stimuli  LABS:  BMET  Recent Labs Lab 03/03/17 0500 03/04/17 0435 03/05/17 0514  NA 140 136 136  K 4.6 3.4* 3.1*  CL 107 103 101  CO2 18* 21* 20*  BUN 165* 142* 135*  CREATININE 3.79* 3.48* 3.25*  GLUCOSE 292* 173* 226*    Electrolytes  Recent Labs Lab 03/03/17 0500 03/04/17 0435 03/05/17 0514  CALCIUM 7.1* 6.8* 6.7*  MG 3.0* 2.8* 2.7*  PHOS 5.1* 4.9* 6.3*    CBC  Recent Labs Lab 03/03/17 0852 03/04/17 0435 03/05/17 0514  WBC 22.5* 20.0* 14.2*  HGB 9.5* 9.4* 9.0*  HCT 29.6* 28.3* 28.2*  PLT 299 211 156    Coag's  Recent Labs Lab 02/22/2017 1025 03/02/17 0550  INR 1.50 1.90     Sepsis Markers  Recent Labs Lab 03/02/17 1445  03/03/17 0500  03/03/17 1247 03/04/17 0435 03/05/17 0514  LATICACIDVEN  --   < >  --   < > 4.3* 4.4* 3.0*  PROCALCITON 8.47  --  8.20  --   --  5.49  --   < > = values in this interval not displayed.  ABG  Recent Labs Lab 03/03/17 0320 03/04/17 0330 03/04/17 1159  PHART 7.433 7.546* 7.525*  PCO2ART 28.4* 24.1* 29.8*  PO2ART 178* 201* 251.0*    Liver Enzymes  Recent Labs Lab 03/03/17 0500 03/04/17 0435 03/05/17 0514  AST 363* 298* 216*  ALT 81* 78* 73*  ALKPHOS 712* 682* 565*  BILITOT 2.2* 2.2* 2.6*  ALBUMIN 1.5* 1.4* 1.3*    Cardiac Enzymes  Recent Labs Lab 03/02/17 1445 03/02/17 2013 03/03/17 0232  TROPONINI 0.86* 0.76* 0.63*    Glucose  Recent Labs Lab 03/04/17 1214 03/04/17 1630 03/04/17 1936 03/04/17 2327 03/05/17 0341 03/05/17 0753  GLUCAP 122* 72 98 135* 186* 196*    Imaging Mr Mra Head Wo Contrast  Result Date: 03/05/2017 CLINICAL DATA:  71 y/o  M; new onset refractory status epilepticus. EXAM: MRA HEAD WITHOUT CONTRAST MRA NECK WITHOUT CONTRAST TECHNIQUE: Angiographic images of the Circle of  [place] were obtained using MRA technique without intravenous contrast. Angiographic images of the neck were obtained using MRA technique without intravenous contrast. Carotid stenosis measurements (when applicable) are obtained utilizing NASCET criteria, using the distal internal carotid diameter as the denominator. COMPARISON:  None. FINDINGS: MRA HEAD FINDINGS Internal carotid arteries:  Patent. Anterior cerebral arteries:  Patent. Middle cerebral arteries: Patent. Left M2 superior division branch proximal short segment of mild stenosis. Anterior communicating artery: Patent. Posterior communicating arteries: Not identified, likely hypoplastic or absent. Posterior cerebral arteries:  Patent. Basilar artery:  Patent. Vertebral arteries:  Patent. No evidence of high-grade stenosis, large vessel  occlusion, or aneurysm unless noted above. MRA NECK FINDINGS Aortic arch: Patent. Right common carotid artery: Patent. Right internal carotid artery: Patent. Right vertebral artery: Patent. Left common carotid artery: Patent. Left Internal carotid artery: Patent. Moderate midcervical tortuosity. Left Vertebral artery: Patent. There is no evidence of hemodynamically significant stenosis by NASCET criteria, occlusion, or aneurysm unless noted above. IMPRESSION: 1. Patent carotid and vertebral arteries. No dissection, aneurysm, or hemodynamically significant stenosis utilizing NASCET criteria. 2. Patent circle of Willis. No large vessel occlusion, aneurysm, or high-grade stenosis. 3. Left M2 superior division branch proximal short segment of mild stenosis. Electronically Signed   By: Kristine Garbe M.D.   On: 03/05/2017 01:15   Mr Jodene Nam Neck Wo Contrast  Result Date: 03/05/2017 CLINICAL DATA:  71 y/o  M; new onset refractory status epilepticus. EXAM: MRA HEAD WITHOUT CONTRAST MRA NECK WITHOUT CONTRAST TECHNIQUE: Angiographic images of the Circle of Willis were obtained using MRA technique without intravenous contrast. Angiographic images of the neck were obtained using MRA technique without intravenous contrast. Carotid stenosis measurements (when applicable) are obtained utilizing NASCET criteria, using the distal internal carotid diameter as the denominator. COMPARISON:  None. FINDINGS: MRA HEAD FINDINGS Internal carotid arteries:  Patent. Anterior cerebral arteries:  Patent. Middle cerebral arteries: Patent. Left M2 superior division branch proximal short segment of mild stenosis. Anterior communicating artery: Patent. Posterior communicating arteries: Not identified, likely hypoplastic or absent. Posterior cerebral arteries:  Patent. Basilar artery:  Patent. Vertebral arteries:  Patent. No evidence of high-grade stenosis, large vessel occlusion, or aneurysm unless noted above. MRA NECK FINDINGS Aortic  arch: Patent. Right common carotid artery: Patent. Right internal carotid artery: Patent. Right vertebral artery: Patent. Left common carotid artery: Patent. Left Internal carotid artery: Patent. Moderate midcervical tortuosity. Left Vertebral artery: Patent. There is no evidence of hemodynamically significant stenosis by NASCET criteria, occlusion, or aneurysm unless noted above. IMPRESSION: 1. Patent carotid and vertebral arteries. No dissection, aneurysm, or hemodynamically significant stenosis utilizing NASCET criteria. 2. Patent circle of Willis. No large vessel occlusion, aneurysm, or high-grade stenosis. 3. Left M2 superior division branch proximal short segment of mild stenosis. Electronically Signed   By: Kristine Garbe M.D.   On: 03/05/2017 01:15   Dg Chest Port 1 View  Result Date: 03/05/2017 CLINICAL DATA:  Respiratory failure EXAM: PORTABLE CHEST 1 VIEW COMPARISON:  03/04/2017 FINDINGS: Endotracheal tube in good position. NG tube in the stomach. Central venous catheter tip in the SVC unchanged. Lungs remain clear without infiltrate or effusion. IMPRESSION: Support lines in good position.  Lungs remain clear. Electronically Signed   By: Franchot Gallo M.D.   On: 03/05/2017 07:28     STUDIES:  CT chest 10/19 >> atherosclerosis CT abd/pelvis 10/19 >> cirrhosis, gallstones, ascites CT head 10/19 >> no acute findings EEG 10/20 >> global cerebral dysfunction worse on Lt, PLEDS from Rt frontal and Rt posterior  temporal lobes Echo 10/21 >> EF 65 to 70%, grade 1 DD MRI brain 10/21 >> cortical/basal ganglia edema Rt hemisphere LP 10/22 >> glucose 92, protein 115, RBC 0, WBC 0 MRA head/neck 10/23 >> patent carotid and vertebral arteries, patent circle of Willis  CULTURES: Blood 10/19 >> Coag neg Staph Sputum 10/20 >> Acinetobacter Blood 10/20 >> CSF 10/22 >>   ANTIBIOTICS: Vancomycin 10/19 >> 10/19 Zosyn 10/19 >>   SIGNIFICANT EVENTS: 10/19 Admit, cardiology consulted 10/20  Neurology, nephrology consulted 10/21 Cardiology s/o 10/23 Off pressors  LINES/TUBES: ETT 10/19 >> Lt radial aline 10/19 >> 10/23 Rt IJ CVL 10/20 >>  ASSESSMENT / PLAN:  Acute hypoxic respiratory failure. - pressure support wean as able - mental status is barrier to extubation trial  Aspiration pneumonia with Acinetobacter. - day 5/7 of Abx  Status epilepticus. Hx of ETOH. Elevated ammonia. 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 - f/u LFTs intermittently - thiamine, folic acid, MVI  Bradycardia. - resolved - likely from hypothermia  Acute renal failure from volume depletion and ACE inhibitor >> uncertain what baseline renal fx is. Hypokalemia. Lactic acidosis. - replace electrolytes - f/u lactic acid - monitor urine outpt  Deep tissue injuries Rt hand, Lt thigh, Rt shoulder. - present prior to admission - continue wound care  DM type II. - SSI  Anemia of critical illness and chronic disease. - f/u CBC  DVT prophylaxis - resume SQ heparin 10/23 SUP - Protonix Nutrition - tube feeds Goals of care - full code.  No family.  Might need to gets ethics committee assistance if he doesn't have any improvement in neurologic status.  CC time 33 minutes  Chesley Mires, MD [place] 03/05/2017, 8:35 AM Pager:  [phone] After 3pm call: [phone]

## 2017-03-05 NOTE — Progress Notes (Signed)
vLTM EEG running. Has skin breakdown on his right ear and temporal area not related to hiss EEG. A2 electrode behind the ear was placed slightly lower to avoid anymore skin breakdown area. There is no skin breakdown when applying the leads. Notified Neuro

## 2017-03-05 NOTE — Progress Notes (Signed)
Patient transported to and from MRI with no problems noted.

## 2017-03-06 ENCOUNTER — Inpatient Hospital Stay (HOSPITAL_COMMUNITY): Payer: Medicare Other

## 2017-03-06 DIAGNOSIS — R40243 Glasgow coma scale score 3-8, unspecified time: Secondary | ICD-10-CM

## 2017-03-06 LAB — RENAL FUNCTION PANEL
ANION GAP: 18 — AB (ref 5–15)
Albumin: 1.3 g/dL — ABNORMAL LOW (ref 3.5–5.0)
BUN: 137 mg/dL — ABNORMAL HIGH (ref 6–20)
CHLORIDE: 104 mmol/L (ref 101–111)
CO2: 22 mmol/L (ref 22–32)
Calcium: 7.1 mg/dL — ABNORMAL LOW (ref 8.9–10.3)
Creatinine, Ser: 3.14 mg/dL — ABNORMAL HIGH (ref 0.61–1.24)
GFR calc Af Amer: 22 mL/min — ABNORMAL LOW (ref >60)
GFR, EST NON AFRICAN AMERICAN: 19 mL/min — AB (ref >60)
Glucose, Bld: 128 mg/dL — ABNORMAL HIGH (ref 65–99)
PHOSPHORUS: 5.9 mg/dL — AB (ref 2.5–4.6)
POTASSIUM: 3.5 mmol/L (ref 3.5–5.1)
Sodium: 144 mmol/L (ref 135–145)

## 2017-03-06 LAB — CBC
HCT: 27.7 % — ABNORMAL LOW (ref 39.0–52.0)
HEMOGLOBIN: 8.9 g/dL — AB (ref 13.0–17.0)
MCH: 24.8 pg — AB (ref 26.0–34.0)
MCHC: 32.1 g/dL (ref 30.0–36.0)
MCV: 77.2 fL — ABNORMAL LOW (ref 78.0–100.0)
Platelets: 196 10*3/uL (ref 150–400)
RBC: 3.59 MIL/uL — ABNORMAL LOW (ref 4.22–5.81)
RDW: 19.5 % — ABNORMAL HIGH (ref 11.5–15.5)
WBC: 15.2 10*3/uL — ABNORMAL HIGH (ref 4.0–10.5)

## 2017-03-06 LAB — GLUCOSE, CAPILLARY
GLUCOSE-CAPILLARY: 131 mg/dL — AB (ref 65–99)
GLUCOSE-CAPILLARY: 78 mg/dL (ref 65–99)
Glucose-Capillary: 97 mg/dL (ref 65–99)
Glucose-Capillary: 132 mg/dL — ABNORMAL HIGH (ref 65–99)
Glucose-Capillary: 136 mg/dL — ABNORMAL HIGH (ref 65–99)

## 2017-03-06 LAB — LACTIC ACID, PLASMA: Lactic Acid, Venous: 3.9 mmol/L (ref 0.5–1.9)

## 2017-03-06 LAB — CULTURE, BLOOD (ROUTINE X 2)
Culture: NO GROWTH
Special Requests: ADEQUATE

## 2017-03-06 MED ORDER — SODIUM CHLORIDE 0.9 % IV BOLUS (SEPSIS)
1000.0000 mL | Freq: Once | INTRAVENOUS | Status: AC
  Administered 2017-03-06: 1000 mL via INTRAVENOUS

## 2017-03-06 MED ORDER — ASPIRIN 81 MG PO CHEW
81.0000 mg | CHEWABLE_TABLET | Freq: Every day | ORAL | Status: DC
  Administered 2017-03-06 – 2017-03-11 (×6): 81 mg
  Filled 2017-03-06 (×6): qty 1

## 2017-03-06 MED ORDER — ORAL CARE MOUTH RINSE
15.0000 mL | OROMUCOSAL | Status: DC
  Administered 2017-03-06 – 2017-03-13 (×74): 15 mL via OROMUCOSAL

## 2017-03-06 NOTE — Progress Notes (Signed)
Subjective: No significant changes  Exam: Vitals:   03/06/17 1500 03/06/17 1600  BP: (!) 90/52 (!) 142/71  Pulse: 61 62  Resp: 14 (!) 23  Temp: 97.7 F (36.5 C) (!) 97.3 F (36.3 C)  SpO2: 100% 100%   Gen: In bed, NAD Resp: non-labored breathing, no acute distress Abd: soft, nt  Neuro: MS: Opens eyes partially to noxious stimulus, does not follow commands. He does grimace to nox stim.  VU:DTHYH Motor: He  has a flicker ofmovement to noxious stimulation in the upper extremities, no movement in the lowers Sensory:as above.   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 The EEG changes(PLEDs proper) I suspect represent injury, likely from prolonged status epilepticus.  Given the pressure injuries, he was likely down for quite some time prior to arrival, and I suspect he was seizing during that.  His MRI changes appear very correlated with vascular territory, though not perfectly going along with them.  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   Overnight EEG without ongoing seizure, just with continued irritability.  The elevated CSF protein is nonspecific, could be related to any injury including prolonged status epilepticus.  Recommendations: 1) continue Keppra, renally dosed, continue Vimpat 200 mg twice daily, continue phenytoin 2) I suspect that his prognosis is relatively poor given his lack of improvement since being admitted, though he may continue to have some improvement over the long-term  Roland Rack, MD [facility] [phone]  If 7pm- 7am, please page neurology on call as listed in [place].

## 2017-03-06 NOTE — Progress Notes (Signed)
Pt's L pupil 59mm larger than the R.  L pupil is sluggish and 7mm.  R pupil is 27mm and sluggish.  Notified Dr. Leonel Ramsay of this.  STAT CT scan ordered and done.

## 2017-03-06 NOTE — Procedures (Signed)
  Video EEG Monitoring Report    Dates of monitoring:   03/05/17 @09 :49 to 03/06/17 @07 :30  Recording day:    1 (started on 10/23) EEG Number:    [id number] Requesting provider:   Roland Rack Interpreting physician:  Beather Arbour Karalina Tift, MD  CPT:  [id number] ICD-10:   G66.201   Present History: 71 year old with complex medical history who was found down for an unknown period of time and required intubation. Muscle twitching was noted and continuous VEEG requested to evaluate for seizures.   EEG Classification  Abnormal, Coma    There is no PDR  HR  60 bpm and regular     Background abnormalities:   1. Continuous slow, generalized    Periodic, rhythmic or epileptiform abnormalities:   1. Sharp LPDs, right posterior quadrant   Ictal phenomena:  none    EEG DETAILS:  TYPE OF RECORDING: At least 18-channel digital EEG with using a standard international 10-20 placement with additional EEG electrodes, and 1 additional channel dedicated to the EKG, at a sampling rate of at least 256 Hz. Video was recorded throughout the study. The recording was interpreted using digital review software allowing for montage reformatting, gain and filter changes. Each page was reviewed manually. Automatic spike and seizure detection software and quantitative analysis tools were used as needed.   Description of EEG features: The recording reveals a continuous, poorly variable and poorly reactive somewhat asymmetric background.   The background consists of delta dominant activity reaching ~5 Hz and intermittent waxing and waning theta and beta activity, with extensive overlying myogenic artifact. On the right, there is diffusely lower amplitude and fewer fast activities.   There is a periodic pattern of sharp waves over the right hemisphere, more prominent in the the posterior quadrant (parieto-occipital) focally maximum at T6. This pattern is continuous (>90% of the recording), fluctuating, with a  period ranging from  1/ second to 1 / 3 seconds and with biphasic, sharp waveforms of medium amplitude (50-200 V).   After ~16:00 there is a change in background with additional decrease in background activity, variability and reactivity and disappearance of the myogenic artifact.    Push Button Events: none  Interpretation: This continuous EEG is indicative of a very active focal epileptic irritative zone in the right posterior quadrant due to the presence of "PLEDs". This pattern is typically associated with an acute-subacute injury such as ischemic, infectious or invasive.   No seizures are seen.

## 2017-03-06 NOTE — Progress Notes (Signed)
Patient vomited. Tube feeds stopped. E-link notified. Hold tube feeds tonight per MD order.

## 2017-03-06 NOTE — Progress Notes (Signed)
PULMONARY / CRITICAL CARE MEDICINE   Name: Dennis [name] MRN: [id number] DOB: 11/07/1945    ADMISSION DATE:  02/18/2017 CONSULTATION DATE:  03/02/2017  REFERRING MD:  Dr. Kieth Brightly, Trauma  CHIEF COMPLAINT:  Respiratory failure  HISTORY OF PRESENT ILLNESS:   71 yo male found unresponsive at home, hypothermic, VDRF, bradycardia, acute renal failure, seizure. PMHx of ETOH, HCV, Pancreatitis, DM, HTN, CKD 2.  SUBJECTIVE:  Remains comatose.  VITAL SIGNS: BP 124/60   Pulse 61   Temp 97.9 F (36.6 C)   Resp 14   Ht 6' (1.829 m)   Wt 191 lb 12.8 oz (87 kg)   SpO2 100%   BMI 26.01 kg/m   HEMODYNAMICS: CVP:  [5 mmHg-13 mmHg] 8 mmHg  VENTILATOR SETTINGS: Vent Mode: PRVC FiO2 (%):  [30 %-40 %] 30 % Set Rate:  [14 bmp] 14 bmp Vt Set:  [540 mL] 540 mL PEEP:  [5 cmH20] 5 cmH20 Pressure Support:  [5 cmH20-8 cmH20] 5 cmH20 Plateau Pressure:  [11 cmH20-28 cmH20] 17 cmH20  INTAKE / OUTPUT: I/O last 3 completed shifts: In: 2274.6 [I.V.:380.4; NG/GT:982.5; IV Piggyback:911.7] Out: 1715 [VOHYW:7371]  PHYSICAL EXAMINATION:  General - on vent Eyes - pupils midpoint ENT - ETT in place Cardiac - regular, no murmur Chest - no wheeze, rales Abd - soft, non tender Ext - 1+ edema Skin - no rashes Neuro - comatose   LABS:  BMET  Recent Labs Lab 03/04/17 0435 03/05/17 0514 03/06/17 0400  NA 136 136 144  K 3.4* 3.1* 3.5  CL 103 101 104  CO2 21* 20* 22  BUN 142* 135* 137*  CREATININE 3.48* 3.25* 3.14*  GLUCOSE 173* 226* 128*    Electrolytes  Recent Labs Lab 03/03/17 0500 03/04/17 0435 03/05/17 0514 03/06/17 0400  CALCIUM 7.1* 6.8* 6.7* 7.1*  MG 3.0* 2.8* 2.7*  --   PHOS 5.1* 4.9* 6.3* 5.9*    CBC  Recent Labs Lab 03/04/17 0435 03/05/17 0514 03/06/17 0400  WBC 20.0* 14.2* 15.2*  HGB 9.4* 9.0* 8.9*  HCT 28.3* 28.2* 27.7*  PLT 211 156 196    Coag's  Recent Labs Lab 02/20/2017 1025 03/02/17 0550  INR 1.50 1.90    Sepsis Markers  Recent  Labs Lab 03/02/17 1445  03/03/17 0500  03/04/17 0435 03/05/17 0514 03/06/17 0400  LATICACIDVEN  --   < >  --   < > 4.4* 3.0* 3.9*  PROCALCITON 8.47  --  8.20  --  5.49  --   --   < > = values in this interval not displayed.  ABG  Recent Labs Lab 03/03/17 0320 03/04/17 0330 03/04/17 1159  PHART 7.433 7.546* 7.525*  PCO2ART 28.4* 24.1* 29.8*  PO2ART 178* 201* 251.0*    Liver Enzymes  Recent Labs Lab 03/03/17 0500 03/04/17 0435 03/05/17 0514 03/06/17 0400  AST 363* 298* 216*  --   ALT 81* 78* 73*  --   ALKPHOS 712* 682* 565*  --   BILITOT 2.2* 2.2* 2.6*  --   ALBUMIN 1.5* 1.4* 1.3* 1.3*    Cardiac Enzymes  Recent Labs Lab 03/02/17 1445 03/02/17 2013 03/03/17 0232  TROPONINI 0.86* 0.76* 0.63*    Glucose  Recent Labs Lab 03/05/17 0753 03/05/17 1201 03/05/17 1526 03/05/17 1954 03/05/17 2332 03/06/17 0317  GLUCAP 196* 189* 182* 149* 194* 131*    Imaging No results found.   STUDIES:  CT chest 10/19 >> atherosclerosis CT abd/pelvis 10/19 >> cirrhosis, gallstones, ascites CT head 10/19 >> no acute  findings EEG 10/20 >> global cerebral dysfunction worse on Lt, PLEDS from Rt frontal and Rt posterior temporal lobes Echo 10/21 >> EF 65 to 70%, grade 1 DD MRI brain 10/21 >> cortical/basal ganglia edema Rt hemisphere LP 10/22 >> glucose 92, protein 115, RBC 0, WBC 0 MRA head/neck 10/23 >> patent carotid and vertebral arteries, patent circle of Willis  CULTURES: Blood 10/19 >> Coag neg Staph Sputum 10/20 >> Acinetobacter Blood 10/20 >> CSF 10/22 >>  CSF HSV 10/22 >> negative  ANTIBIOTICS: Vancomycin 10/19 >> 10/19 Zosyn 10/19 >>   SIGNIFICANT EVENTS: 10/19 Admit, cardiology consulted 10/20 Neurology, nephrology consulted 10/21 Cardiology s/o  LINES/TUBES: ETT 10/19 >> Lt radial aline 10/19 >> 10/23 Rt IJ CVL 10/20 >>  ASSESSMENT / PLAN:  Acute hypoxic respiratory failure. - pressure support wean as able - mental status barrier to  extubation  - f/u CXR  Aspiration pneumonia with Acinetobacter. - day 6/7 Abx  Status epilepticus. 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 Hx of ETOH. Elevated ammonia. - continuous EEG - f/u CSF culture - continue lactulose - AEDs per neurology - ASA 81 mg daily - pressors for MAP goal > 75 per neurology  Shock from volume depletion and sepsis. 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 - f/u LFTs - thiamine, folic acid, MVI  Bradycardia. - resolved - likely from hypothermia  Acute renal failure from volume depletion and ACE inhibitor >> uncertain what baseline renal fx is. Hypokalemia. Lactic acidosis. - monitor renal fx, urine outpt - replace electrolytes as needed  Deep tissue injuries Rt hand, Lt thigh, Rt shoulder. - present prior to admission - continue wound care  DM type II. - SSI  Anemia of critical illness and chronic disease. - f/u CBC  DVT prophylaxis - SQ heparin SUP - Protonix Nutrition - tube feeds Goals of care - full code.  No family.  Might need to gets ethics committee assistance if he doesn't have any improvement in neurologic status.  CC time 32 minutes  Chesley Mires, MD [place] 03/06/2017, 7:30 AM Pager:  [phone] After 3pm call: [phone]

## 2017-03-06 NOTE — Progress Notes (Signed)
[facility] Progress Note Patient Name: Dennis [name] DOB: 08-04-1945 MRN: [id number]   Date of Service  03/06/2017  HPI/Events of Note  Lactic Acid = 3.0 --> 3.8. CVP = 7. Last LVEF = 65% --> 70%.  eICU Interventions  Will order: 1. Bolus with 0.9 NaCl 1 liter IV over 1 hour now.      Intervention Category Major Interventions: Acid-Base disturbance - evaluation and management  Sommer,Steven Eugene 03/06/2017, 5:13 AM

## 2017-03-06 NOTE — Progress Notes (Signed)
Subjective: Interval History: not responsive, on vent, EEG monitoring.  Objective: Vital signs in last 24 hours: Temp:  [96.8 F (36 C)-98.2 F (36.8 C)] 97.7 F (36.5 C) (10/24 0800) Pulse Rate:  [55-73] 66 (10/24 0800) Resp:  [14-26] 19 (10/24 0800) BP: (68-143)/(50-126) 132/65 (10/24 0800) SpO2:  [99 %-100 %] 100 % (10/24 0800) Arterial Line BP: (111-132)/(50-58) 111/50 (10/23 1200) FiO2 (%):  [30 %] 30 % (10/24 0800) Weight:  [87 kg (191 lb 12.8 oz)] 87 kg (191 lb 12.8 oz) (10/24 0500) Weight change: 10 kg (22 lb 0.7 oz)  Intake/Output from previous day: 10/23 0701 - 10/24 0700 In: 1403.1 [I.V.:118.9; NG/GT:612.5; IV Piggyback:671.7] Out: 1030 [Urine:1030] Intake/Output this shift: Total I/O In: 22.6 [I.V.:22.6] Out: 150 [Urine:150]  General appearance: nonresponsive, , EEG monitoring Resp: rhonchi bibasilar and decreased bs Cardio: S1, S2 normal and systolic murmur: holosystolic 2/6, blowing at apex GI: bs but decreased, liver down 5 cm Extremities: edema 2-3+  Lab Results:  Recent Labs  03/05/17 0514 03/06/17 0400  WBC 14.2* 15.2*  HGB 9.0* 8.9*  HCT 28.2* 27.7*  PLT 156 196   BMET:  Recent Labs  03/05/17 0514 03/06/17 0400  NA 136 144  K 3.1* 3.5  CL 101 104  CO2 20* 22  GLUCOSE 226* 128*  BUN 135* 137*  CREATININE 3.25* 3.14*  CALCIUM 6.7* 7.1*   No results for input(s): PTH in the last 72 hours. Iron Studies: No results for input(s): IRON, TIBC, TRANSFERRIN, FERRITIN in the last 72 hours.  Studies/Results: Mr Virgel Paling ER Contrast  Result Date: 03/05/2017 CLINICAL DATA:  71 y/o  M; new onset refractory status epilepticus. EXAM: MRA HEAD WITHOUT CONTRAST MRA NECK WITHOUT CONTRAST TECHNIQUE: Angiographic images of the Circle of Willis were obtained using MRA technique without intravenous contrast. Angiographic images of the neck were obtained using MRA technique without intravenous contrast. Carotid stenosis measurements (when applicable) are  obtained utilizing NASCET criteria, using the distal internal carotid diameter as the denominator. COMPARISON:  None. FINDINGS: MRA HEAD FINDINGS Internal carotid arteries:  Patent. Anterior cerebral arteries:  Patent. Middle cerebral arteries: Patent. Left M2 superior division branch proximal short segment of mild stenosis. Anterior communicating artery: Patent. Posterior communicating arteries: Not identified, likely hypoplastic or absent. Posterior cerebral arteries:  Patent. Basilar artery:  Patent. Vertebral arteries:  Patent. No evidence of high-grade stenosis, large vessel occlusion, or aneurysm unless noted above. MRA NECK FINDINGS Aortic arch: Patent. Right common carotid artery: Patent. Right internal carotid artery: Patent. Right vertebral artery: Patent. Left common carotid artery: Patent. Left Internal carotid artery: Patent. Moderate midcervical tortuosity. Left Vertebral artery: Patent. There is no evidence of hemodynamically significant stenosis by NASCET criteria, occlusion, or aneurysm unless noted above. IMPRESSION: 1. Patent carotid and vertebral arteries. No dissection, aneurysm, or hemodynamically significant stenosis utilizing NASCET criteria. 2. Patent circle of Willis. No large vessel occlusion, aneurysm, or high-grade stenosis. 3. Left M2 superior division branch proximal short segment of mild stenosis. Electronically Signed   By: Kristine Garbe M.D.   On: 03/05/2017 01:15   Mr Jodene Nam Neck Wo Contrast  Result Date: 03/05/2017 CLINICAL DATA:  71 y/o  M; new onset refractory status epilepticus. EXAM: MRA HEAD WITHOUT CONTRAST MRA NECK WITHOUT CONTRAST TECHNIQUE: Angiographic images of the Circle of Willis were obtained using MRA technique without intravenous contrast. Angiographic images of the neck were obtained using MRA technique without intravenous contrast. Carotid stenosis measurements (when applicable) are obtained utilizing NASCET criteria, using the distal internal  carotid diameter as the denominator. COMPARISON:  None. FINDINGS: MRA HEAD FINDINGS Internal carotid arteries:  Patent. Anterior cerebral arteries:  Patent. Middle cerebral arteries: Patent. Left M2 superior division branch proximal short segment of mild stenosis. Anterior communicating artery: Patent. Posterior communicating arteries: Not identified, likely hypoplastic or absent. Posterior cerebral arteries:  Patent. Basilar artery:  Patent. Vertebral arteries:  Patent. No evidence of high-grade stenosis, large vessel occlusion, or aneurysm unless noted above. MRA NECK FINDINGS Aortic arch: Patent. Right common carotid artery: Patent. Right internal carotid artery: Patent. Right vertebral artery: Patent. Left common carotid artery: Patent. Left Internal carotid artery: Patent. Moderate midcervical tortuosity. Left Vertebral artery: Patent. There is no evidence of hemodynamically significant stenosis by NASCET criteria, occlusion, or aneurysm unless noted above. IMPRESSION: 1. Patent carotid and vertebral arteries. No dissection, aneurysm, or hemodynamically significant stenosis utilizing NASCET criteria. 2. Patent circle of Willis. No large vessel occlusion, aneurysm, or high-grade stenosis. 3. Left M2 superior division branch proximal short segment of mild stenosis. Electronically Signed   By: Kristine Garbe M.D.   On: 03/05/2017 01:15   Dg Chest Port 1 View  Result Date: 03/05/2017 CLINICAL DATA:  Respiratory failure EXAM: PORTABLE CHEST 1 VIEW COMPARISON:  03/04/2017 FINDINGS: Endotracheal tube in good position. NG tube in the stomach. Central venous catheter tip in the SVC unchanged. Lungs remain clear without infiltrate or effusion. IMPRESSION: Support lines in good position.  Lungs remain clear. Electronically Signed   By: Franchot Gallo M.D.   On: 03/05/2017 07:28    I have reviewed the patient's current medications.  Assessment/Plan: 1 AKI?? Unknown baseline.  Slowly better GFR and  improving acid/base status. Slow spont diuresis. Vol xs , allow to diurese on his own. Etio from low bp and ACEI 2 VDRF per CCM MS 3 Asp pneu on AB 4 Enceph per Neuro 5 Sz on meds 6 Cirrhosis 7 Tiss trauma  P Allow to diurese, follow chem , Vent, AB   LOS: 5 days   Bhavin Monjaraz L 03/06/2017,10:25 AM

## 2017-03-07 ENCOUNTER — Inpatient Hospital Stay (HOSPITAL_COMMUNITY): Payer: Medicare Other

## 2017-03-07 DIAGNOSIS — G40901 Epilepsy, unspecified, not intractable, with status epilepticus: Secondary | ICD-10-CM

## 2017-03-07 LAB — CBC
HEMATOCRIT: 26.5 % — AB (ref 39.0–52.0)
Hemoglobin: 8.3 g/dL — ABNORMAL LOW (ref 13.0–17.0)
MCH: 24.1 pg — ABNORMAL LOW (ref 26.0–34.0)
MCHC: 31.3 g/dL (ref 30.0–36.0)
MCV: 77 fL — ABNORMAL LOW (ref 78.0–100.0)
PLATELETS: 208 10*3/uL (ref 150–400)
RBC: 3.44 MIL/uL — AB (ref 4.22–5.81)
RDW: 19.8 % — AB (ref 11.5–15.5)
WBC: 17.3 10*3/uL — AB (ref 4.0–10.5)

## 2017-03-07 LAB — COMPREHENSIVE METABOLIC PANEL
ALT: 53 U/L (ref 17–63)
ANION GAP: 17 — AB (ref 5–15)
AST: 104 U/L — AB (ref 15–41)
Albumin: 1.3 g/dL — ABNORMAL LOW (ref 3.5–5.0)
Alkaline Phosphatase: 604 U/L — ABNORMAL HIGH (ref 38–126)
BILIRUBIN TOTAL: 2.7 mg/dL — AB (ref 0.3–1.2)
BUN: 134 mg/dL — AB (ref 6–20)
CHLORIDE: 106 mmol/L (ref 101–111)
CO2: 22 mmol/L (ref 22–32)
Calcium: 6.8 mg/dL — ABNORMAL LOW (ref 8.9–10.3)
Creatinine, Ser: 3.01 mg/dL — ABNORMAL HIGH (ref 0.61–1.24)
GFR, EST AFRICAN AMERICAN: 23 mL/min — AB (ref >60)
GFR, EST NON AFRICAN AMERICAN: 20 mL/min — AB (ref >60)
Glucose, Bld: 129 mg/dL — ABNORMAL HIGH (ref 65–99)
POTASSIUM: 3.3 mmol/L — AB (ref 3.5–5.1)
Sodium: 145 mmol/L (ref 135–145)
TOTAL PROTEIN: 6.2 g/dL — AB (ref 6.5–8.1)

## 2017-03-07 LAB — CULTURE, BLOOD (ROUTINE X 2)
CULTURE: NO GROWTH
Culture: NO GROWTH
SPECIAL REQUESTS: ADEQUATE
Special Requests: ADEQUATE

## 2017-03-07 LAB — IRON AND TIBC
IRON: 12 ug/dL — AB (ref 45–182)
SATURATION RATIOS: 9 % — AB (ref 17.9–39.5)
TIBC: 134 ug/dL — ABNORMAL LOW (ref 250–450)
UIBC: 122 ug/dL

## 2017-03-07 LAB — CSF CULTURE: CULTURE: NO GROWTH

## 2017-03-07 LAB — GLUCOSE, CAPILLARY
GLUCOSE-CAPILLARY: 119 mg/dL — AB (ref 65–99)
GLUCOSE-CAPILLARY: 120 mg/dL — AB (ref 65–99)
GLUCOSE-CAPILLARY: 136 mg/dL — AB (ref 65–99)
GLUCOSE-CAPILLARY: 174 mg/dL — AB (ref 65–99)
Glucose-Capillary: 140 mg/dL — ABNORMAL HIGH (ref 65–99)
Glucose-Capillary: 108 mg/dL — ABNORMAL HIGH (ref 65–99)

## 2017-03-07 LAB — PHOSPHORUS: PHOSPHORUS: 5.9 mg/dL — AB (ref 2.5–4.6)

## 2017-03-07 LAB — CSF CULTURE W GRAM STAIN

## 2017-03-07 LAB — AMMONIA: Ammonia: 73 umol/L — ABNORMAL HIGH (ref 9–35)

## 2017-03-07 LAB — TRIGLYCERIDES: Triglycerides: 214 mg/dL — ABNORMAL HIGH (ref <150)

## 2017-03-07 MED ORDER — CHLORHEXIDINE GLUCONATE 0.12 % MT SOLN
OROMUCOSAL | Status: AC
  Administered 2017-03-07: 15 mL
  Filled 2017-03-07: qty 15

## 2017-03-07 MED ORDER — POTASSIUM CHLORIDE 10 MEQ/100ML IV SOLN
10.0000 meq | INTRAVENOUS | Status: AC
  Administered 2017-03-07 (×2): 10 meq via INTRAVENOUS
  Filled 2017-03-07 (×2): qty 100

## 2017-03-07 MED ORDER — NOREPINEPHRINE BITARTRATE 1 MG/ML IV SOLN
0.0000 ug/min | INTRAVENOUS | Status: DC
  Administered 2017-03-07: 4 ug/min via INTRAVENOUS
  Administered 2017-03-08 (×3): 7 ug/min via INTRAVENOUS
  Administered 2017-03-09: 11 ug/min via INTRAVENOUS
  Administered 2017-03-09: 8 ug/min via INTRAVENOUS
  Filled 2017-03-07 (×5): qty 4

## 2017-03-07 MED ORDER — PROPOFOL 1000 MG/100ML IV EMUL
50.0000 ug/kg/min | INTRAVENOUS | Status: DC
  Administered 2017-03-07: 60 ug/kg/min via INTRAVENOUS
  Administered 2017-03-07: 30 ug/kg/min via INTRAVENOUS
  Administered 2017-03-07: 80 ug/kg/min via INTRAVENOUS
  Administered 2017-03-07: 60 ug/kg/min via INTRAVENOUS
  Administered 2017-03-07: 80 ug/kg/min via INTRAVENOUS
  Administered 2017-03-08 (×3): 50 ug/kg/min via INTRAVENOUS
  Administered 2017-03-08: 60 ug/kg/min via INTRAVENOUS
  Administered 2017-03-08: 50 ug/kg/min via INTRAVENOUS
  Administered 2017-03-08: 60 ug/kg/min via INTRAVENOUS
  Administered 2017-03-09 (×2): 50 ug/kg/min via INTRAVENOUS
  Filled 2017-03-07 (×13): qty 100

## 2017-03-07 NOTE — Progress Notes (Signed)
PULMONARY / CRITICAL CARE MEDICINE   Name: Dennis [name] MRN: [id number] DOB: 1946-03-27    ADMISSION DATE:  03/12/2017 CONSULTATION DATE:  03/02/2017  REFERRING MD:  Dr. Kieth Brightly, Trauma  CHIEF COMPLAINT:  Respiratory failure  HISTORY OF PRESENT ILLNESS:   71 yo male found unresponsive at home, hypothermic, VDRF, bradycardia, acute renal failure, seizure. PMHx of ETOH, HCV, Pancreatitis, DM, HTN, CKD 2.  SUBJECTIVE:  Tolerates pressure support.  VITAL SIGNS: BP (!) 137/59   Pulse 74   Temp 100.2 F (37.9 C)   Resp (!) 25   Ht 6' (1.829 m)   Wt 194 lb 7.1 oz (88.2 kg)   SpO2 100%   BMI 26.37 kg/m   HEMODYNAMICS: CVP:  [6 mmHg-12 mmHg] 7 mmHg  VENTILATOR SETTINGS: Vent Mode: CPAP;PSV FiO2 (%):  [30 %] 30 % Set Rate:  [14 bmp] 14 bmp Vt Set:  [540 mL] 540 mL PEEP:  [5 cmH20] 5 cmH20 Pressure Support:  [5 cmH20-8 cmH20] 5 cmH20 Plateau Pressure:  [19 cmH20-22 cmH20] 22 cmH20  INTAKE / OUTPUT: I/O last 3 completed shifts: In: 1376.8 [I.V.:392.6; NG/GT:157.5; IV Piggyback:826.7] Out: 1885 [EYCXK:4818; Emesis/NG output:150]  PHYSICAL EXAMINATION:  General - on vent Eyes - pupils mid point ENT - no sinus tenderness, no oral exudate, no LAN Cardiac - regular, no murmur Chest - no wheeze, rales Abd - soft, non tender Ext - 1+ edema Skin - no rashes Neuro - comatose   LABS:  BMET  Recent Labs Lab 03/05/17 0514 03/06/17 0400 03/07/17 0522  NA 136 144 145  K 3.1* 3.5 3.3*  CL 101 104 106  CO2 20* 22 22  BUN 135* 137* 134*  CREATININE 3.25* 3.14* 3.01*  GLUCOSE 226* 128* 129*    Electrolytes  Recent Labs Lab 03/03/17 0500 03/04/17 0435 03/05/17 0514 03/06/17 0400 03/07/17 0522  CALCIUM 7.1* 6.8* 6.7* 7.1* 6.8*  MG 3.0* 2.8* 2.7*  --   --   PHOS 5.1* 4.9* 6.3* 5.9* 5.9*    CBC  Recent Labs Lab 03/05/17 0514 03/06/17 0400 03/07/17 0522  WBC 14.2* 15.2* 17.3*  HGB 9.0* 8.9* 8.3*  HCT 28.2* 27.7* 26.5*  PLT 156 196 208     Coag's  Recent Labs Lab 03/07/2017 1025 03/02/17 0550  INR 1.50 1.90    Sepsis Markers  Recent Labs Lab 03/02/17 1445  03/03/17 0500  03/04/17 0435 03/05/17 0514 03/06/17 0400  LATICACIDVEN  --   < >  --   < > 4.4* 3.0* 3.9*  PROCALCITON 8.47  --  8.20  --  5.49  --   --   < > = values in this interval not displayed.  ABG  Recent Labs Lab 03/03/17 0320 03/04/17 0330 03/04/17 1159  PHART 7.433 7.546* 7.525*  PCO2ART 28.4* 24.1* 29.8*  PO2ART 178* 201* 251.0*    Liver Enzymes  Recent Labs Lab 03/04/17 0435 03/05/17 0514 03/06/17 0400 03/07/17 0522  AST 298* 216*  --  104*  ALT 78* 73*  --  53  ALKPHOS 682* 565*  --  604*  BILITOT 2.2* 2.6*  --  2.7*  ALBUMIN 1.4* 1.3* 1.3* 1.3*    Cardiac Enzymes  Recent Labs Lab 03/02/17 1445 03/02/17 2013 03/03/17 0232  TROPONINI 0.86* 0.76* 0.63*    Glucose  Recent Labs Lab 03/06/17 0755 03/06/17 1138 03/06/17 1512 03/06/17 2336 03/07/17 0343 03/07/17 0804  GLUCAP 97 132* 136* 78 120* 136*    Imaging Ct Head Wo Contrast  Result Date: 03/06/2017  CLINICAL DATA:  Seizure EXAM: CT HEAD WITHOUT CONTRAST TECHNIQUE: Contiguous axial images were obtained from the base of the skull through the vertex without intravenous contrast. COMPARISON:  March 01, 2017 FINDINGS: Brain: There is mild diffuse atrophy, stable. There is no intracranial mass, hemorrhage, extra-axial fluid collection, or midline shift. There is mild patchy small vessel disease in the centra semiovale bilaterally. Elsewhere gray-white compartments appear normal. No evident acute infarct. Vascular: No hyperdense vessel evident. There is calcification in each carotid siphon region. Skull: Bony calvarium appears intact. Sinuses/Orbits: There is opacification throughout much of the right maxillary antrum with either orbits appear symmetric bilaterally. Other: There are air-fluid levels in the right mastoid air cell complex. There is opacification in  several mastoid air cells on the right. There are smaller air-fluid levels in left mastoid air cells with opacification of several left mastoid air cells. There is debris in each external auditory canal. IMPRESSION: 1. Mild atrophy with mild periventricular small vessel disease. No intracranial mass, hemorrhage, or extra-axial fluid collection. No acute infarct evident. 2.  Foci of arterial vascular calcification bilaterally. 3.  Multifocal paranasal sinus disease. 4. Mastoid disease bilaterally with several air-fluid levels bilaterally in the mastoid regions, more on the right than on the left. 5.  Probable cerumen in each external auditory canal. Electronically Signed   By: Lowella Grip III M.D.   On: 03/06/2017 14:53   Mr Brain Wo Contrast  Result Date: 03/06/2017 CLINICAL DATA:  Found down for unknown period of time. Suspected RIGHT anterior circulation infarct and status epilepticus. Altered mental status. EXAM: MRI HEAD WITHOUT CONTRAST TECHNIQUE: Multiplanar, multiecho pulse sequences of the brain and surrounding structures were obtained without intravenous contrast. COMPARISON:  CT HEAD March 06, 2017 at 1442 hours and MRI of the head March 03, 2017 FINDINGS: BRAIN: Decreasing reduced diffusion RIGHT frontotemporal lobes predominately involving the cortex, and, RIGHT thalamus. Faint residual reduced diffusion RIGHT mesial temporoparietal lobes and, splenium of corpus callosum. Areas of diffusion abnormality demonstrate FLAIR T2 hyperintense signal though decreased from prior imaging, notable for today's 3 tesla study, previously 1.5 tesla examination. Normal ADC values. No new areas of diffusion abnormality. No susceptibility artifact to suggest hemorrhage. RIGHT hippocampus is mildly enlarged, and edematous. No midline shift. No abnormal extra-axial fluid collections. VASCULAR: Normal major intracranial vascular flow voids present at skull base. SKULL AND UPPER CERVICAL SPINE: No abnormal  sellar expansion. No suspicious calvarial bone marrow signal. Craniocervical junction maintained. SINUSES/ORBITS: Mild paranasal sinus mucosal thickening and bilateral mastoid effusions. The included ocular globes and orbital contents are non-suspicious. OTHER: None. IMPRESSION: 1. Resolving signal abnormality predominately involving the RIGHT cerebrum, persistently edematous RIGHT hippocampus. Findings favor resolving status epilepticus. Potential underlying infection including HSV, inflammatory encephalitis or toxic encephalopathy from hyperammonemia possible . Unlikely to reflect ischemic changes considering distribution. Electronically Signed   By: Elon Alas M.D.   On: 03/06/2017 22:54   Dg Chest Port 1 View  Result Date: 03/07/2017 CLINICAL DATA:  Respiratory failure EXAM: PORTABLE CHEST 1 VIEW COMPARISON:  03/05/2017 FINDINGS: Cardiac shadow is stable. Endotracheal tube, nasogastric catheter and right jugular central line are again seen and stable. The lungs are well aerated bilaterally. No focal infiltrate is seen. No bony abnormality is noted. IMPRESSION: No acute abnormality noted. Electronically Signed   By: Inez Catalina M.D.   On: 03/07/2017 07:34     STUDIES:  CT chest 10/19 >> atherosclerosis CT abd/pelvis 10/19 >> cirrhosis, gallstones, ascites CT head 10/19 >> no acute findings EEG  10/20 >> global cerebral dysfunction worse on Lt, PLEDS from Rt frontal and Rt posterior temporal lobes Echo 10/21 >> EF 65 to 70%, grade 1 DD MRI brain 10/21 >> cortical/basal ganglia edema Rt hemisphere LP 10/22 >> glucose 92, protein 115, RBC 0, WBC 0 MRA head/neck 10/23 >> patent carotid and vertebral arteries, patent circle of Willis MRI brain 10/24 >> resolving Rt cerebrum signal abnormality, persistent edema Rt hippocampus  CULTURES: Blood 10/19 >> Coag neg Staph Sputum 10/20 >> Acinetobacter Blood 10/20 >> CSF 10/22 >>  CSF HSV 10/22 >> negative  ANTIBIOTICS: Vancomycin 10/19 >>  10/19 Zosyn 10/19 >>   SIGNIFICANT EVENTS: 10/19 Admit, cardiology consulted 10/20 Neurology, nephrology consulted 10/21 Cardiology s/o 10/25 Burst suppression  LINES/TUBES: ETT 10/19 >> Lt radial aline 10/19 >> 10/23 Rt IJ CVL 10/20 >>  ASSESSMENT / PLAN:  Acute hypoxic respiratory failure. - pressure support wean as tolerated - mental status barrier to extubation - will need to consider trach sometime next week if no further improvement in neuro status  Aspiration pneumonia with Acinetobacter. - day 7/7 of Abx >> d/c after dose on 10/25  Status epilepticus. Hx of ETOH. Elevated ammonia. - neuro to start burst suppression 10/25 - f/u CSF culture - continue lactulose - continue ASA  Shock from volume depletion and sepsis. 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 - f/u LFTs - thiamine, folic acid, MVI  Bradycardia. - resolved - likely from hypothermia  Acute renal failure from volume depletion and ACE inhibitor >> uncertain what baseline renal fx is. Hypokalemia. Lactic acidosis. - monitor renal fx, urine outpt - replace electrolytes as needed  Deep tissue injuries Rt hand, Lt thigh, Rt shoulder. - present prior to admission - continue wound care  DM type II. - SSI  Anemia of critical illness and chronic disease. - f/u CBC - check iron levels  DVT prophylaxis - SQ heparin SUP - Protonix Nutrition - tube feeds Goals of care - full code.  No family.  Might need to gets ethics committee assistance if he doesn't have any improvement in neurologic status.  D/w Dr. Leonel Ramsay and Dr. Jimmy Footman.  CC time 36 minutes  Chesley Mires, MD [place] 03/07/2017, 8:33 AM Pager:  [phone] After 3pm call: [phone]

## 2017-03-07 NOTE — Progress Notes (Signed)
vLTM EEG running. Notified Neuro 

## 2017-03-07 NOTE — Progress Notes (Signed)
Subjective: Interval History: nonresponsive.  Objective: Vital signs in last 24 hours: Temp:  [96.8 F (36 C)-100.8 F (38.2 C)] 100.2 F (37.9 C) (10/25 0700) Pulse Rate:  [52-77] 73 (10/25 0700) Resp:  [13-31] 23 (10/25 0700) BP: (90-142)/(52-85) 133/65 (10/25 0700) SpO2:  [100 %] 100 % (10/25 0700) FiO2 (%):  [30 %] 30 % (10/25 0339) Weight:  [88.2 kg (194 lb 7.1 oz)] 88.2 kg (194 lb 7.1 oz) (10/25 0500) Weight change: 1.2 kg (2 lb 10.3 oz)  Intake/Output from previous day: 10/24 0701 - 10/25 0700 In: 600.7 [I.V.:290.7; IV Piggyback:310] Out: 1335 [Urine:1185; Emesis/NG output:150] Intake/Output this shift: No intake/output data recorded.  General appearance: below Resp: nonresponsive Cardio: S1, S2 normal and systolic murmur: systolic ejection 2/6, crescendo at 2nd left intercostal space GI: pos bs, soft, liver 4 cm Extremities: edema 3+  Lab Results:  Recent Labs  03/06/17 0400 03/07/17 0522  WBC 15.2* 17.3*  HGB 8.9* 8.3*  HCT 27.7* 26.5*  PLT 196 208   BMET:  Recent Labs  03/06/17 0400 03/07/17 0522  NA 144 145  K 3.5 3.3*  CL 104 106  CO2 22 22  GLUCOSE 128* 129*  BUN 137* 134*  CREATININE 3.14* 3.01*  CALCIUM 7.1* 6.8*   No results for input(s): PTH in the last 72 hours. Iron Studies: No results for input(s): IRON, TIBC, TRANSFERRIN, FERRITIN in the last 72 hours.  Studies/Results: Ct Head Wo Contrast  Result Date: 03/06/2017 CLINICAL DATA:  Seizure EXAM: CT HEAD WITHOUT CONTRAST TECHNIQUE: Contiguous axial images were obtained from the base of the skull through the vertex without intravenous contrast. COMPARISON:  March 01, 2017 FINDINGS: Brain: There is mild diffuse atrophy, stable. There is no intracranial mass, hemorrhage, extra-axial fluid collection, or midline shift. There is mild patchy small vessel disease in the centra semiovale bilaterally. Elsewhere gray-white compartments appear normal. No evident acute infarct. Vascular: No  hyperdense vessel evident. There is calcification in each carotid siphon region. Skull: Bony calvarium appears intact. Sinuses/Orbits: There is opacification throughout much of the right maxillary antrum with either orbits appear symmetric bilaterally. Other: There are air-fluid levels in the right mastoid air cell complex. There is opacification in several mastoid air cells on the right. There are smaller air-fluid levels in left mastoid air cells with opacification of several left mastoid air cells. There is debris in each external auditory canal. IMPRESSION: 1. Mild atrophy with mild periventricular small vessel disease. No intracranial mass, hemorrhage, or extra-axial fluid collection. No acute infarct evident. 2.  Foci of arterial vascular calcification bilaterally. 3.  Multifocal paranasal sinus disease. 4. Mastoid disease bilaterally with several air-fluid levels bilaterally in the mastoid regions, more on the right than on the left. 5.  Probable cerumen in each external auditory canal. Electronically Signed   By: Lowella Grip III M.D.   On: 03/06/2017 14:53   Mr Brain Wo Contrast  Result Date: 03/06/2017 CLINICAL DATA:  Found down for unknown period of time. Suspected RIGHT anterior circulation infarct and status epilepticus. Altered mental status. EXAM: MRI HEAD WITHOUT CONTRAST TECHNIQUE: Multiplanar, multiecho pulse sequences of the brain and surrounding structures were obtained without intravenous contrast. COMPARISON:  CT HEAD March 06, 2017 at 1442 hours and MRI of the head March 03, 2017 FINDINGS: BRAIN: Decreasing reduced diffusion RIGHT frontotemporal lobes predominately involving the cortex, and, RIGHT thalamus. Faint residual reduced diffusion RIGHT mesial temporoparietal lobes and, splenium of corpus callosum. Areas of diffusion abnormality demonstrate FLAIR T2 hyperintense signal though decreased  from prior imaging, notable for today's 3 tesla study, previously 1.5 tesla  examination. Normal ADC values. No new areas of diffusion abnormality. No susceptibility artifact to suggest hemorrhage. RIGHT hippocampus is mildly enlarged, and edematous. No midline shift. No abnormal extra-axial fluid collections. VASCULAR: Normal major intracranial vascular flow voids present at skull base. SKULL AND UPPER CERVICAL SPINE: No abnormal sellar expansion. No suspicious calvarial bone marrow signal. Craniocervical junction maintained. SINUSES/ORBITS: Mild paranasal sinus mucosal thickening and bilateral mastoid effusions. The included ocular globes and orbital contents are non-suspicious. OTHER: None. IMPRESSION: 1. Resolving signal abnormality predominately involving the RIGHT cerebrum, persistently edematous RIGHT hippocampus. Findings favor resolving status epilepticus. Potential underlying infection including HSV, inflammatory encephalitis or toxic encephalopathy from hyperammonemia possible . Unlikely to reflect ischemic changes considering distribution. Electronically Signed   By: Elon Alas M.D.   On: 03/06/2017 22:54   Dg Chest Port 1 View  Result Date: 03/07/2017 CLINICAL DATA:  Respiratory failure EXAM: PORTABLE CHEST 1 VIEW COMPARISON:  03/05/2017 FINDINGS: Cardiac shadow is stable. Endotracheal tube, nasogastric catheter and right jugular central line are again seen and stable. The lungs are well aerated bilaterally. No focal infiltrate is seen. No bony abnormality is noted. IMPRESSION: No acute abnormality noted. Electronically Signed   By: Inez Catalina M.D.   On: 03/07/2017 07:34    I have reviewed the patient's current medications.  Assessment/Plan: 1 AKI vs CKD slow trend better, diuresing spont.  Hold off Lasix. Etio low bp and ACEI 2 VDRF MS related 3 Asp Pneu on AB 4 Sz per neuro 5 Cirrhosis 6 DM controlled 7 HTN not an issue 8 Enceph CVA and sz?? 9 Niutrition to start TF P k, follow Cr, allow to diurese, vent, sz meds   LOS: 6 days   Kelvin Sennett  L 03/07/2017,8:27 AM

## 2017-03-07 NOTE — Progress Notes (Signed)
Notified MD regarding restarting tube feeding at 0000. MD informed RN that it was a situation that could be handled during AM shift when the MD's round. Continuing to assess CBG levels to avoid hypoglycemia.  Will continue to monitor.  Guadalupe Maple, RN

## 2017-03-08 DIAGNOSIS — G9340 Encephalopathy, unspecified: Secondary | ICD-10-CM

## 2017-03-08 LAB — RENAL FUNCTION PANEL
ALBUMIN: 1.3 g/dL — AB (ref 3.5–5.0)
ANION GAP: 19 — AB (ref 5–15)
BUN: 137 mg/dL — ABNORMAL HIGH (ref 6–20)
CHLORIDE: 106 mmol/L (ref 101–111)
CO2: 21 mmol/L — ABNORMAL LOW (ref 22–32)
Calcium: 7.3 mg/dL — ABNORMAL LOW (ref 8.9–10.3)
Creatinine, Ser: 3.1 mg/dL — ABNORMAL HIGH (ref 0.61–1.24)
GFR calc Af Amer: 22 mL/min — ABNORMAL LOW (ref >60)
GFR, EST NON AFRICAN AMERICAN: 19 mL/min — AB (ref >60)
Glucose, Bld: 145 mg/dL — ABNORMAL HIGH (ref 65–99)
PHOSPHORUS: 6 mg/dL — AB (ref 2.5–4.6)
POTASSIUM: 3.5 mmol/L (ref 3.5–5.1)
Sodium: 146 mmol/L — ABNORMAL HIGH (ref 135–145)

## 2017-03-08 LAB — CBC
HEMATOCRIT: 26.1 % — AB (ref 39.0–52.0)
HEMOGLOBIN: 8 g/dL — AB (ref 13.0–17.0)
MCH: 24.7 pg — ABNORMAL LOW (ref 26.0–34.0)
MCHC: 30.7 g/dL (ref 30.0–36.0)
MCV: 80.6 fL (ref 78.0–100.0)
Platelets: 229 10*3/uL (ref 150–400)
RBC: 3.24 MIL/uL — AB (ref 4.22–5.81)
RDW: 19.9 % — AB (ref 11.5–15.5)
WBC: 18.8 10*3/uL — AB (ref 4.0–10.5)

## 2017-03-08 LAB — GLUCOSE, CAPILLARY
GLUCOSE-CAPILLARY: 137 mg/dL — AB (ref 65–99)
GLUCOSE-CAPILLARY: 166 mg/dL — AB (ref 65–99)
GLUCOSE-CAPILLARY: 177 mg/dL — AB (ref 65–99)
Glucose-Capillary: 100 mg/dL — ABNORMAL HIGH (ref 65–99)
Glucose-Capillary: 107 mg/dL — ABNORMAL HIGH (ref 65–99)
Glucose-Capillary: 143 mg/dL — ABNORMAL HIGH (ref 65–99)

## 2017-03-08 LAB — PARATHYROID HORMONE, INTACT (NO CA): PTH: 202 pg/mL — ABNORMAL HIGH (ref 15–65)

## 2017-03-08 MED ORDER — SODIUM CHLORIDE 0.9 % IV BOLUS (SEPSIS)
1000.0000 mL | Freq: Once | INTRAVENOUS | Status: AC
  Administered 2017-03-08: 1000 mL via INTRAVENOUS

## 2017-03-08 MED ORDER — FREE WATER
100.0000 mL | Freq: Three times a day (TID) | Status: DC
  Administered 2017-03-08 – 2017-03-11 (×12): 100 mL

## 2017-03-08 MED ORDER — POTASSIUM CHLORIDE 20 MEQ/15ML (10%) PO SOLN
40.0000 meq | Freq: Once | ORAL | Status: AC
  Administered 2017-03-08: 40 meq via ORAL
  Filled 2017-03-08: qty 30

## 2017-03-08 MED ORDER — SODIUM CHLORIDE 0.9 % IV BOLUS (SEPSIS)
500.0000 mL | Freq: Once | INTRAVENOUS | Status: AC
  Administered 2017-03-08: 500 mL via INTRAVENOUS

## 2017-03-08 MED ORDER — ARTIFICIAL TEARS OPHTHALMIC OINT
TOPICAL_OINTMENT | OPHTHALMIC | Status: DC | PRN
  Administered 2017-03-08: 21:00:00 via OPHTHALMIC
  Administered 2017-03-09: 1 via OPHTHALMIC
  Filled 2017-03-08: qty 3.5

## 2017-03-08 MED ORDER — SODIUM CHLORIDE 0.9 % IV SOLN: Freq: Once | INTRAVENOUS | Status: AC

## 2017-03-08 NOTE — Progress Notes (Signed)
Conductive gel added to all leads, all imp below 10K Ohms

## 2017-03-08 NOTE — Progress Notes (Signed)
PULMONARY / CRITICAL CARE MEDICINE   Name: Dennis [name] MRN: [id number] DOB: 08-09-1945    ADMISSION DATE:  02/28/2017 CONSULTATION DATE:  03/02/2017  REFERRING MD:  Dr. Kieth Brightly, Trauma  CHIEF COMPLAINT:  Respiratory failure  HISTORY OF PRESENT ILLNESS:   71 yo male found unresponsive at home, hypothermic, VDRF, bradycardia, acute renal failure, seizure. PMHx of ETOH, HCV, Pancreatitis, DM, HTN, CKD 2.  SUBJECTIVE:  Tolerates pressure support when switched over. Continuous EEG at present with high dose propofol. Per neuro plans for slow wean propofol today ( 10/26) with faster wean 10/27 if no electrical seizure activity.   VITAL SIGNS: BP (!) 109/57   Pulse (!) 48   Temp 99.1 F (37.3 C)   Resp 20   Ht 6' (1.829 m)   Wt 196 lb 13.9 oz (89.3 kg)   SpO2 100%   BMI 26.70 kg/m   HEMODYNAMICS: CVP:  [11 mmHg-12 mmHg] 11 mmHg  VENTILATOR SETTINGS: Vent Mode: PRVC FiO2 (%):  [30 %] 30 % Set Rate:  [14 bmp] 14 bmp Vt Set:  [540 mL] 540 mL PEEP:  [5 cmH20] 5 cmH20 Pressure Support:  [8 cmH20] 8 cmH20 Plateau Pressure:  [19 cmH20-20 cmH20] 19 cmH20  INTAKE / OUTPUT: I/O last 3 completed shifts: In: 2792.8 [I.V.:1298.8; Other:125; NG/GT:369; IV Piggyback:1000] Out: 9562 [Urine:1025; Emesis/NG output:50]  PHYSICAL EXAMINATION:  General - Intubated and heavily sedated on vent, full support, levophed. Eyes - pupils remain mid point ENT - no sinus tenderness, no oral exudate, no LAN, Oral ETT, OG tube Cardiac - S1, S2, RRR, no RMG Chest - no wheeze, rales, coarse, diminished per bases Abd - soft, non tender, non-distended, BS+ Ext - 1+ edema, L>R per upper extremities Skin - no rashes, multiple skin tears and abrasions Neuro - heavily sedated, chemical coma, follows no commands   LABS:  BMET  Recent Labs Lab 03/06/17 0400 03/07/17 0522 03/08/17 0420  NA 144 145 146*  K 3.5 3.3* 3.5  CL 104 106 106  CO2 22 22 21*  BUN 137* 134* 137*  CREATININE 3.14*  3.01* 3.10*  GLUCOSE 128* 129* 145*    Electrolytes  Recent Labs Lab 03/03/17 0500 03/04/17 0435 03/05/17 0514 03/06/17 0400 03/07/17 0522 03/08/17 0420  CALCIUM 7.1* 6.8* 6.7* 7.1* 6.8* 7.3*  MG 3.0* 2.8* 2.7*  --   --   --   PHOS 5.1* 4.9* 6.3* 5.9* 5.9* 6.0*    CBC  Recent Labs Lab 03/06/17 0400 03/07/17 0522 03/08/17 0420  WBC 15.2* 17.3* 18.8*  HGB 8.9* 8.3* 8.0*  HCT 27.7* 26.5* 26.1*  PLT 196 208 229    Coag's  Recent Labs Lab 02/13/2017 1025 03/02/17 0550  INR 1.50 1.90    Sepsis Markers  Recent Labs Lab 03/02/17 1445  03/03/17 0500  03/04/17 0435 03/05/17 0514 03/06/17 0400  LATICACIDVEN  --   < >  --   < > 4.4* 3.0* 3.9*  PROCALCITON 8.47  --  8.20  --  5.49  --   --   < > = values in this interval not displayed.  ABG  Recent Labs Lab 03/03/17 0320 03/04/17 0330 03/04/17 1159  PHART 7.433 7.546* 7.525*  PCO2ART 28.4* 24.1* 29.8*  PO2ART 178* 201* 251.0*    Liver Enzymes  Recent Labs Lab 03/04/17 0435 03/05/17 0514 03/06/17 0400 03/07/17 0522 03/08/17 0420  AST 298* 216*  --  104*  --   ALT 78* 73*  --  53  --  ALKPHOS 682* 565*  --  604*  --   BILITOT 2.2* 2.6*  --  2.7*  --   ALBUMIN 1.4* 1.3* 1.3* 1.3* 1.3*    Cardiac Enzymes  Recent Labs Lab 03/02/17 1445 03/02/17 2013 03/03/17 0232  TROPONINI 0.86* 0.76* 0.63*    Glucose  Recent Labs Lab 03/07/17 1230 03/07/17 1613 03/07/17 1937 03/07/17 2333 03/08/17 0322 03/08/17 0811  GLUCAP 140* 108* 119* 174* 143* 137*    Imaging No results found.   STUDIES:  CT chest 10/19 >> atherosclerosis CT abd/pelvis 10/19 >> cirrhosis, gallstones, ascites CT head 10/19 >> no acute findings EEG 10/20 >> global cerebral dysfunction worse on Lt, PLEDS from Rt frontal and Rt posterior temporal lobes Echo 10/21 >> EF 65 to 70%, grade 1 DD MRI brain 10/21 >> cortical/basal ganglia edema Rt hemisphere LP 10/22 >> glucose 92, protein 115, RBC 0, WBC 0 MRA head/neck  10/23 >> patent carotid and vertebral arteries, patent circle of Willis MRI brain 10/24 >> resolving Rt cerebrum signal abnormality, persistent edema Rt hippocampus  CULTURES: Blood 10/19 >> Coag neg Staph Sputum 10/20 >> Acinetobacter Blood 10/20 >> CSF 10/22 >>  CSF HSV 10/22 >> negative  ANTIBIOTICS: Vancomycin 10/19 >> 10/19 Zosyn 10/19 >>   SIGNIFICANT EVENTS: 10/19 Admit, cardiology consulted 10/20 Neurology, nephrology consulted 10/21 Cardiology s/o 10/25 Burst suppression  LINES/TUBES: ETT 10/19 >> Lt radial aline 10/19 >> 10/23 Rt IJ CVL 10/20 >>  ASSESSMENT / PLAN:  Acute hypoxic respiratory failure. - pressure support wean as tolerated>> hold 10/26 , 2/2 continuous EEG and high dose propofol  - mental status remains  barrier to extubation - will need to consider trach sometime  week  Of 10/28 if no further improvement in neuro status  Aspiration pneumonia with Acinetobacter. T Max overnight 100.2 Leukocytosis - Completed  7 days of therapy  - CXR prn - Trend fever/ WBC  Status epilepticus. Hx of ETOH. Elevated ammonia. Continuous EEG 10/26 - neuro to start burst suppression 10/25 - f/u CSF culture>> No growth - continue lactulose - continue ASA - Ammonia level 10/27 am  Shock from volume depletion and sepsis. 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 - f/u LFTs ( CMET 98/92) - thiamine, folic acid, MVI  Bradycardia. - secondary to propofol 10/26 - 12 Lead EKG prn  Acute renal failure from volume depletion and ACE inhibitor >> uncertain what baseline renal fx is. Hypokalemia. Lactic acidosis.>> Gap increase to 19 Hypernatremia - monitor renal fx, urine outpt - replace electrolytes as needed - Strict I&O - Allow for spontaneous diuresis - Start low dose free water ( 100 cc Q 8)  Deep tissue injuries Rt hand, Lt thigh, Rt shoulder. - present  prior to admission - consult to wound nurse 10/26  DM type II. - CBG's Q 4 - SSI  Anemia of critical illness and chronic disease. - f/u CBC - check iron levels - Transfuse for HGB < 7  DVT prophylaxis - SQ heparin SUP - Protonix Nutrition - tube feeds Goals of care - full code.  No family.  Might need to gets ethics committee assistance if he doesn't have any improvement in neurologic status. CM and CSW are assigned to the patient. Ellan Lambert Case Manager [phone], and Barbette Or Robins [phone]. Spoke with Barbette Or,  CSW. They have been unable to find any family. They  are reaching out to the detective involved in the case and if detectives  have no family information, the patient will need ethics consult and steps to make ward of the state. He will need assessment for trach PEG the week of 10/28 if he makes no further progress with weaning / mental status improvement.We will wait until neuro finishes with burst suppression and consider ethics consult early next week.     Magdalen Spatz, AGACNP-BC [facility] 03/08/2017, 8:18 AM Pager: [phone]

## 2017-03-08 NOTE — Progress Notes (Signed)
Nutrition Follow-up  INTERVENTION:   Continue:  Nepro to 35 ml/hr (840 ml/day) 60 ml Prostat BID Provides: 1912 kcal, 128 grams protein, and 610 ml free water.  Total free water: 910 ml   TF regimen and propofol at current rate providing 2612 total kcal/day   NUTRITION DIAGNOSIS:   Inadequate oral intake related to inability to eat as evidenced by NPO status. Ongoing.   GOAL:   Patient will meet greater than or equal to 90% of their needs Not met.   MONITOR:   TF tolerance, Labs  ASSESSMENT:   Pt with PMH of ETOH, HCV, pancreatitis, DM, HTN, CKD stage 2 admitted after being found down, hypothermic, ARF.   Pt discussed during ICU rounds and with RN. Per RN plans to d/c propofol tomorrow Noted Nepro running at 20 instead of 35 which is ordered.   Patient is currently intubated on ventilator support MV: 10.4 L/min Temp (24hrs), Avg:98.5 F (36.9 C), Min:95.7 F (35.4 C), Max:100.2 F (37.9 C)  Propofol: 26.5 ml/hr provides: 700 kcal  Medications reviewed and include: folvite, lactulose BID, MVI, thiamine, levo  Free water: 100 ml every 8 hours Labs reviewed: Na 146 (H), BUN/Cr 137/3.10, PO4 6 (H) CBG's: [phone]    Diet Order:  Diet NPO time specified  EDUCATION NEEDS:   No education needs identified at this time  Skin:  Skin Assessment:  (stage II hip, bil thigh, leg, chest, ustageable heel)  Last BM:  10/25  Height:   Ht Readings from Last 1 Encounters:  02/18/2017 6' (1.829 m)    Weight:   Wt Readings from Last 1 Encounters:  03/08/17 196 lb 13.9 oz (89.3 kg)    Ideal Body Weight:  80.91 kg  BMI:  Body mass index is 26.7 kg/m.  Estimated Nutritional Needs:   Kcal:  1950  Protein:  117-130g Pro (1.8-2 g/kg bw)  Fluid:  Per MD    Maylon Peppers RD, LDN, CNSC [phone] Pager [phone] After Hours Pager

## 2017-03-08 NOTE — Progress Notes (Addendum)
PCCM paged, pts HR staying in low 40s, urine outpt 125 for 8 hrs.  Discussed with Dr Halford Chessman, orders received.

## 2017-03-08 NOTE — Progress Notes (Signed)
Subjective: Interval History: Put on NE yest pm, no visible activity or response  Objective: Vital signs in last 24 hours: Temp:  [95.7 F (35.4 C)-100.2 F (37.9 C)] 99.1 F (37.3 C) (10/26 0600) Pulse Rate:  [48-74] 48 (10/26 0600) Resp:  [13-24] 20 (10/26 0812) BP: (79-139)/(47-79) 109/57 (10/26 0812) SpO2:  [98 %-100 %] 100 % (10/26 0600) FiO2 (%):  [30 %] 30 % (10/26 0812) Weight:  [89.3 kg (196 lb 13.9 oz)] 89.3 kg (196 lb 13.9 oz) (10/26 0439) Weight change: 1.1 kg (2 lb 6.8 oz)  Intake/Output from previous day: 10/25 0701 - 10/26 0700 In: 2386.2 [I.V.:1152.2; NG/GT:369; IV Piggyback:740] Out: 715 [Urine:665; Emesis/NG output:50] Intake/Output this shift: Total I/O In: -  Out: 50 [Urine:50]  General appearance: no movement or response Resp: rales bibasilar Cardio: S1, S2 normal and systolic murmur: systolic ejection 2/6, decrescendo at 2nd left intercostal space GI: pos bs, soft, liver down 5 cm Extremitie2-3+, multiple skin woundsedema 2-3+, multiple skin wounds  Lab Results:  Recent Labs  03/07/17 0522 03/08/17 0420  WBC 17.3* 18.8*  HGB 8.3* 8.0*  HCT 26.5* 26.1*  PLT 208 229   BMET:  Recent Labs  03/07/17 0522 03/08/17 0420  NA 145 146*  K 3.3* 3.5  CL 106 106  CO2 22 21*  GLUCOSE 129* 145*  BUN 134* 137*  CREATININE 3.01* 3.10*  CALCIUM 6.8* 7.3*    Recent Labs  03/07/17 0930  PTH 202*   Iron Studies:  Recent Labs  03/07/17 0930  IRON 12*  TIBC 134*    Studies/Results: Ct Head Wo Contrast  Result Date: 03/06/2017 CLINICAL DATA:  Seizure EXAM: CT HEAD WITHOUT CONTRAST TECHNIQUE: Contiguous axial images were obtained from the base of the skull through the vertex without intravenous contrast. COMPARISON:  March 01, 2017 FINDINGS: Brain: There is mild diffuse atrophy, stable. There is no intracranial mass, hemorrhage, extra-axial fluid collection, or midline shift. There is mild patchy small vessel disease in the centra semiovale  bilaterally. Elsewhere gray-white compartments appear normal. No evident acute infarct. Vascular: No hyperdense vessel evident. There is calcification in each carotid siphon region. Skull: Bony calvarium appears intact. Sinuses/Orbits: There is opacification throughout much of the right maxillary antrum with either orbits appear symmetric bilaterally. Other: There are air-fluid levels in the right mastoid air cell complex. There is opacification in several mastoid air cells on the right. There are smaller air-fluid levels in left mastoid air cells with opacification of several left mastoid air cells. There is debris in each external auditory canal. IMPRESSION: 1. Mild atrophy with mild periventricular small vessel disease. No intracranial mass, hemorrhage, or extra-axial fluid collection. No acute infarct evident. 2.  Foci of arterial vascular calcification bilaterally. 3.  Multifocal paranasal sinus disease. 4. Mastoid disease bilaterally with several air-fluid levels bilaterally in the mastoid regions, more on the right than on the left. 5.  Probable cerumen in each external auditory canal. Electronically Signed   By: Lowella Grip III M.D.   On: 03/06/2017 14:53   Mr Brain Wo Contrast  Result Date: 03/06/2017 CLINICAL DATA:  Found down for unknown period of time. Suspected RIGHT anterior circulation infarct and status epilepticus. Altered mental status. EXAM: MRI HEAD WITHOUT CONTRAST TECHNIQUE: Multiplanar, multiecho pulse sequences of the brain and surrounding structures were obtained without intravenous contrast. COMPARISON:  CT HEAD March 06, 2017 at 1442 hours and MRI of the head March 03, 2017 FINDINGS: BRAIN: Decreasing reduced diffusion RIGHT frontotemporal lobes predominately involving the cortex,  and, RIGHT thalamus. Faint residual reduced diffusion RIGHT mesial temporoparietal lobes and, splenium of corpus callosum. Areas of diffusion abnormality demonstrate FLAIR T2 hyperintense signal  though decreased from prior imaging, notable for today's 3 tesla study, previously 1.5 tesla examination. Normal ADC values. No new areas of diffusion abnormality. No susceptibility artifact to suggest hemorrhage. RIGHT hippocampus is mildly enlarged, and edematous. No midline shift. No abnormal extra-axial fluid collections. VASCULAR: Normal major intracranial vascular flow voids present at skull base. SKULL AND UPPER CERVICAL SPINE: No abnormal sellar expansion. No suspicious calvarial bone marrow signal. Craniocervical junction maintained. SINUSES/ORBITS: Mild paranasal sinus mucosal thickening and bilateral mastoid effusions. The included ocular globes and orbital contents are non-suspicious. OTHER: None. IMPRESSION: 1. Resolving signal abnormality predominately involving the RIGHT cerebrum, persistently edematous RIGHT hippocampus. Findings favor resolving status epilepticus. Potential underlying infection including HSV, inflammatory encephalitis or toxic encephalopathy from hyperammonemia possible . Unlikely to reflect ischemic changes considering distribution. Electronically Signed   By: Elon Alas M.D.   On: 03/06/2017 22:54   Dg Chest Port 1 View  Result Date: 03/07/2017 CLINICAL DATA:  Respiratory failure EXAM: PORTABLE CHEST 1 VIEW COMPARISON:  03/05/2017 FINDINGS: Cardiac shadow is stable. Endotracheal tube, nasogastric catheter and right jugular central line are again seen and stable. The lungs are well aerated bilaterally. No focal infiltrate is seen. No bony abnormality is noted. IMPRESSION: No acute abnormality noted. Electronically Signed   By: Inez Catalina M.D.   On: 03/07/2017 07:34    I have reviewed the patient's current medications.  Assessment/Plan: 1 Impaired GFR, not a lot of change.  Urine vol lower with  Low bps, suspect impaired RBF.  Cr little change.  Free water given for [employer].  K given.  Baseline not known 2 ^SNa 3 Sz per Neuro 4 Enceph to lower sedation. 5 DM  controlled 6 Wounds local care 7 Low bp ?? xs meds vs infx , on AB, weaning P Lower sedation,  Cont AB, free water, U Na, Cr,     LOS: 7 days   Jenner Rosier L 03/08/2017,9:18 AM

## 2017-03-08 NOTE — Progress Notes (Signed)
Subjective: No significant changes  Exam: Vitals:   03/08/17 1600 03/08/17 1700  BP: (!) 115/59 (!) 108/57  Pulse: (!) 43 (!) 42  Resp: 17 17  Temp: (!) 97.5 F (36.4 C) (!) 97.3 F (36.3 C)  SpO2: 100% 100%   Gen: In bed, NAD Resp: non-labored breathing, no acute distress Abd: soft, nt  Neuro: MS: Opens eyes partially to noxious stimulus, does not follow commands. He does grimace to nox stim.  PY:PPJKD Motor: He  has a flicker ofmovement to noxious stimulation in the upper extremities, no movement in the lowers Sensory:as above.   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 The EEG changes(PLEDs proper) I suspect represent injury, likely from prolonged status epilepticus.  Given the pressure injuries, he was likely down for quite some time prior to arrival, and I suspect he was seizing during that.  With improvement in the diffusion change, I feel this makes ischemic infarct less likely.  He does have periodic discharges, it is possible that this represents ongoing status epilepticus, it may be reasonable to pursue burst suppression for 24-48 hours.   Recommendations: 1) continue Keppra, renally dosed, continue Vimpat 200 mg twice daily, continue phenytoin 2) burst suppression with propofol  This patient is critically ill and at significant risk of neurological worsening, death and care requires constant monitoring of vital signs, hemodynamics,respiratory and cardiac monitoring, neurological assessment, discussion with family, other specialists and medical decision making of high complexity. I spent 40 minutes of neurocritical care time  in the care of  this patient.  Roland Rack, MD [facility] [phone]  If 7pm- 7am, please page neurology on call as listed in [place]. 03/08/2017  5:58 PM

## 2017-03-08 NOTE — Progress Notes (Signed)
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  He had continued periodic discharges, and there is a possibility that this represented ongoing seizure and therefore due to his severe clinical status, he was started on propofol for burst suppression.  He has been suppressed overnight, without further periodic discharges.   Recommendations: 1) continue Keppra, renally dosed, continue Vimpat 200 mg twice daily, continue phenytoin 2) slow wean of propofol over the next 24 hours  This patient is critically ill and at significant risk of neurological worsening, death and care requires constant monitoring of vital signs, hemodynamics,respiratory and cardiac monitoring, neurological assessment, discussion with family, other specialists and medical decision making of high complexity. I spent 40 minutes of neurocritical care time  in the care of  this patient.  Roland Rack, MD [facility] [phone]  If 7pm- 7am, please page neurology on call as listed in [place]. 03/08/2017  5:59 PM

## 2017-03-08 NOTE — Progress Notes (Signed)
HR still in low 40s. Called e link to ask for HR parameters for atropine, etc. Sharee Pimple RN to notify Dr Deterdine at [phone] when she arrives.

## 2017-03-08 NOTE — Plan of Care (Signed)
Problem: Physical Regulation: Goal: Diagnostic test results will improve Outcome: Progressing Decreasing propofol with no seizure activiyt per neurologist.  Problem: Safety: Goal: Verbalization of understanding the information provided will improve Outcome: Not Met (add Reason) Pt not conscious, no family has been found per social work.

## 2017-03-08 NOTE — Procedures (Signed)
  Video EEG Monitoring Report    Dates of monitoring:   03/07/17 @10 :15 to 03/08/17 @07 :30  Recording day:    1 (started on 10/25) EEG Number:    [id number] Requesting provider:   Roland Rack Interpreting physician:  Beather Arbour Lorrin Bodner, MD  CPT:  [phone] ICD-10:   G40.201   Present History: 71 year old with complex medical history who was found down for an unknown period of time and required intubation. Muscle twitching was noted and continuous VEEG requested to evaluate for seizures.   EEG Classification  Abnormal, Coma    There is no PDR  HR  60 bpm and regular     Background abnormalities:   1. Continuous slow, generalized --> Burst-suppression, generalized    Periodic, rhythmic or epileptiform abnormalities:   1. Sharp LPDs, right posterior quadrant   Ictal phenomena:  none    EEG DETAILS:  TYPE OF RECORDING: At least 18-channel digital EEG with using a standard international 10-20 placement with additional EEG electrodes, and 1 additional channel dedicated to the EKG, at a sampling rate of at least 256 Hz. Video was recorded throughout the study. The recording was interpreted using digital review software allowing for montage reformatting, gain and filter changes. Each page was reviewed manually. Automatic spike and seizure detection software and quantitative analysis tools were used as needed.   Description of EEG features: The recording opens with a continuous, variable and moderately reactive somewhat asymmetric background.   The background consists of delta dominant activity reaching ~5 Hz and intermittent waxing and waning theta and beta activity. On the right, there is diffusely lower amplitude and fewer fast activities.   With stimulation and spontaneously, periods of relative arousal are seen.   There is a semiperiodic pattern of sharp waves over the right hemisphere, more prominent in the the posterior quadrant (parieto-occipital) focally maximum at T6.  This pattern is continuous (>90% of the recording), fluctuating, with a period ranging from  1 / 3 seconds to 1 / 5 seconds and with biphasic, sharp waveforms of medium amplitude (50-200 V).   However, after 13:00, there is a progressive decrease in background activity, with loss of variability and reactivity and also decrease in amplitude so that by 14:00 there are long periods of relative cortical suppression with intermittent brief periods of very low amplitude sinusoid theta maximum bi-frontal parasagittal. The interictal discharges completely disappear as well.   After ~17:00 the background changes again to burst-suppression pattern with bursts of sinusoid theta of 2-3 seconds alternating with 2-10seconds of suppression. The suppression ratio remains relatively stable at ~60% for the rest of the epoch.   Push Button Events: none  Interpretation: This continuous EEG is initially indicative of a focal epileptic irritative zone in the right posterior quadrant due to the presence of "PLEDs". Compared to previous recording (10/23-24) this is decreased in frequency and periodicity. No seizures are seen.   From ~13:00 on 10/25 the interictal activity disappears and the background becomes burst-suppression, most probably due to sedating medication effect.

## 2017-03-08 NOTE — Consult Note (Addendum)
[place] Nurse wound consult note Reason for Consult: multiple wounds, partial thickness, full thickness, DTI, Unstageable  Wound type:traumatic and pressure Pressure Injury POA: Yes Measurement: Please see original consult. There are 12 wounds that I measured today, range from 3cm to 17cm. They are black and yellow with hardened shell Wound bed: black and yellow hardened eschar Drainage (amount, consistency, odor) scant Periwound:intact Dressing procedure/placement/frequency: I will change the heel wound care to paint with betadine and leave open to air. The right hand is my main concern.  The current treatment is not helping, recommend hand surgeon consult, please order if you agree. Will leave current orders in place until consult is made. We will not follow, but will remain available to this patient, to nursing, and the medical and/or surgical teams.  Please re-consult if we need to assist further.   Fara Olden, RN-C, WTA-C, [facility]

## 2017-03-09 ENCOUNTER — Inpatient Hospital Stay (HOSPITAL_COMMUNITY): Payer: Medicare Other

## 2017-03-09 DIAGNOSIS — J9690 Respiratory failure, unspecified, unspecified whether with hypoxia or hypercapnia: Secondary | ICD-10-CM

## 2017-03-09 LAB — COMPREHENSIVE METABOLIC PANEL
ALBUMIN: 1.2 g/dL — AB (ref 3.5–5.0)
ALT: 46 U/L (ref 17–63)
AST: 71 U/L — AB (ref 15–41)
Alkaline Phosphatase: 547 U/L — ABNORMAL HIGH (ref 38–126)
Anion gap: 20 — ABNORMAL HIGH (ref 5–15)
BUN: 141 mg/dL — AB (ref 6–20)
CHLORIDE: 104 mmol/L (ref 101–111)
CO2: 18 mmol/L — ABNORMAL LOW (ref 22–32)
Calcium: 7.3 mg/dL — ABNORMAL LOW (ref 8.9–10.3)
Creatinine, Ser: 3.2 mg/dL — ABNORMAL HIGH (ref 0.61–1.24)
GFR calc Af Amer: 21 mL/min — ABNORMAL LOW (ref >60)
GFR calc non Af Amer: 18 mL/min — ABNORMAL LOW (ref >60)
GLUCOSE: 145 mg/dL — AB (ref 65–99)
POTASSIUM: 4 mmol/L (ref 3.5–5.1)
Sodium: 142 mmol/L (ref 135–145)
Total Bilirubin: 2.3 mg/dL — ABNORMAL HIGH (ref 0.3–1.2)
Total Protein: 6.4 g/dL — ABNORMAL LOW (ref 6.5–8.1)

## 2017-03-09 LAB — GLUCOSE, CAPILLARY
GLUCOSE-CAPILLARY: 152 mg/dL — AB (ref 65–99)
Glucose-Capillary: 138 mg/dL — ABNORMAL HIGH (ref 65–99)
Glucose-Capillary: 140 mg/dL — ABNORMAL HIGH (ref 65–99)
Glucose-Capillary: 167 mg/dL — ABNORMAL HIGH (ref 65–99)
Glucose-Capillary: 171 mg/dL — ABNORMAL HIGH (ref 65–99)
Glucose-Capillary: 124 mg/dL — ABNORMAL HIGH (ref 65–99)

## 2017-03-09 LAB — BLOOD GAS, ARTERIAL
ACID-BASE DEFICIT: 9.7 mmol/L — AB (ref 0.0–2.0)
Bicarbonate: 14.7 mmol/L — ABNORMAL LOW (ref 20.0–28.0)
Drawn by: 213381
FIO2: 30
LHR: 14 {breaths}/min
O2 Saturation: 98.7 %
PEEP/CPAP: 5 cmH2O
Patient temperature: 98.6
VT: 540 mL
pCO2 arterial: 26.4 mmHg — ABNORMAL LOW (ref 32.0–48.0)
pH, Arterial: 7.363 (ref 7.350–7.450)
pO2, Arterial: 136 mmHg — ABNORMAL HIGH (ref 83.0–108.0)

## 2017-03-09 LAB — CBC
HCT: 26.6 % — ABNORMAL LOW (ref 39.0–52.0)
HEMOGLOBIN: 7.9 g/dL — AB (ref 13.0–17.0)
MCH: 24.1 pg — ABNORMAL LOW (ref 26.0–34.0)
MCHC: 29.7 g/dL — ABNORMAL LOW (ref 30.0–36.0)
MCV: 81.1 fL (ref 78.0–100.0)
PLATELETS: 284 10*3/uL (ref 150–400)
RBC: 3.28 MIL/uL — AB (ref 4.22–5.81)
RDW: 20.1 % — ABNORMAL HIGH (ref 11.5–15.5)
WBC: 19.5 10*3/uL — AB (ref 4.0–10.5)

## 2017-03-09 LAB — POCT I-STAT 3, ART BLOOD GAS (G3+)
Acid-base deficit: 6 mmol/L — ABNORMAL HIGH (ref 0.0–2.0)
Bicarbonate: 17.2 mmol/L — ABNORMAL LOW (ref 20.0–28.0)
O2 Saturation: 99 %
TCO2: 18 mmol/L — AB (ref 22–32)
pCO2 arterial: 24.6 mmHg — ABNORMAL LOW (ref 32.0–48.0)
pH, Arterial: 7.454 — ABNORMAL HIGH (ref 7.350–7.450)
pO2, Arterial: 129 mmHg — ABNORMAL HIGH (ref 83.0–108.0)

## 2017-03-09 LAB — AMMONIA: AMMONIA: 167 umol/L — AB (ref 9–35)

## 2017-03-09 LAB — PHOSPHORUS: Phosphorus: 6.5 mg/dL — ABNORMAL HIGH (ref 2.5–4.6)

## 2017-03-09 LAB — TROPONIN I: TROPONIN I: 0.05 ng/mL — AB (ref <0.03)

## 2017-03-09 LAB — LACTIC ACID, PLASMA
LACTIC ACID, VENOUS: 5.8 mmol/L — AB (ref 0.5–1.9)
LACTIC ACID, VENOUS: 6 mmol/L — AB (ref 0.5–1.9)

## 2017-03-09 MED ORDER — LACTULOSE 10 GM/15ML PO SOLN
30.0000 g | Freq: Two times a day (BID) | ORAL | Status: DC
  Administered 2017-03-09 – 2017-03-11 (×5): 30 g
  Filled 2017-03-09 (×5): qty 45

## 2017-03-09 MED ORDER — DOPAMINE-DEXTROSE 3.2-5 MG/ML-% IV SOLN
0.0000 ug/kg/min | INTRAVENOUS | Status: DC
  Administered 2017-03-09: 5 ug/kg/min via INTRAVENOUS
  Administered 2017-03-10: 20 ug/kg/min via INTRAVENOUS
  Administered 2017-03-10: 15 ug/kg/min via INTRAVENOUS
  Administered 2017-03-11 – 2017-03-13 (×7): 20 ug/kg/min via INTRAVENOUS
  Filled 2017-03-09 (×10): qty 250

## 2017-03-09 MED ORDER — SODIUM BICARBONATE 8.4 % IV SOLN
INTRAVENOUS | Status: DC
  Administered 2017-03-09 – 2017-03-11 (×4): via INTRAVENOUS
  Filled 2017-03-09 (×6): qty 150

## 2017-03-09 MED ORDER — SODIUM CHLORIDE 0.9 % IV BOLUS (SEPSIS)
1000.0000 mL | Freq: Once | INTRAVENOUS | Status: AC
  Administered 2017-03-09: 1000 mL via INTRAVENOUS

## 2017-03-09 MED ORDER — DOPAMINE-DEXTROSE 3.2-5 MG/ML-% IV SOLN
INTRAVENOUS | Status: AC
  Filled 2017-03-09: qty 250

## 2017-03-09 MED ORDER — ATROPINE SULFATE 1 MG/10ML IJ SOSY
PREFILLED_SYRINGE | INTRAMUSCULAR | Status: AC
  Filled 2017-03-09: qty 10

## 2017-03-09 NOTE — Progress Notes (Signed)
LTM EEG checked, no skin breakdown noted, conductive gel added to all electrodes

## 2017-03-09 NOTE — Progress Notes (Signed)
Dr Leonel Ramsay notified over phone of pts bradycardia and arrythmias. OK to turn propofol off.

## 2017-03-09 NOTE — Plan of Care (Deleted)
  Interdisciplinary Goals of Care Family Meeting   Date carried out:: 03/09/2017  Location of the meeting: Bedside  Member's involved: Physician, Bedside Registered Nurse and Family Member or next of kin  [place] or acting medical decision maker: Dennis [name] / Nephew    Discussion: We discussed goals of care for [name] .  Conversation at bedside with patient's nephew, brother, and friends. Briefly address the results of her recent head CT scan. Also reviewed by physical exam findings from today. We discussed continuing aggressive medical care versus transitioning to full comfort care with terminal vent wean and extubation along with palliation of symptoms utilizing medication. Family/nephew wishes to discuss this new CT head further with neurology.  Code status: Full DNR  Disposition: Continue current acute care  Time spent for the meeting: 8 minutes   Dennis [name] 03/09/2017, 12:55 PM

## 2017-03-09 NOTE — Progress Notes (Signed)
PULMONARY / CRITICAL CARE MEDICINE   Name: Dennis [name] MRN: [id number] DOB: 30-Dec-1945    ADMISSION DATE:  02/14/2017 CONSULTATION DATE:  03/02/2017  REFERRING MD:  Dr. Kieth Brightly, Trauma  CHIEF COMPLAINT:  Respiratory failure  BRIEF  71 yo male found unresponsive at home, hypothermic, VDRF, bradycardia, acute renal failure, seizure. PMHx of ETOH, HCV, Pancreatitis, DM, HTN, CKD 2.   STUDIES:  CT chest 10/19 >> atherosclerosis CT abd/pelvis 10/19 >> cirrhosis, gallstones, ascites CT head 10/19 >> no acute findings EEG 10/20 >> global cerebral dysfunction worse on Lt, PLEDS from Rt frontal and Rt posterior temporal lobes Echo 10/21 >> EF 65 to 70%, grade 1 DD MRI brain 10/21 >> cortical/basal ganglia edema Rt hemisphere LP 10/22 >> glucose 92, protein 115, RBC 0, WBC 0 MRA head/neck 10/23 >> patent carotid and vertebral arteries, patent circle of Willis MRI brain 10/24 >> resolving Rt cerebrum signal abnormality, persistent edema Rt hippocampus  CULTURES: Blood 10/19 >> Coag neg Staph Sputum 10/20 >> Acinetobacter Blood 10/20 >> CSF 10/22 >>  CSF HSV 10/22 >> negative  ANTIBIOTICS: Vancomycin 10/19 >> 10/19 Zosyn 10/19 >>   LINES/TUBES: ETT 10/19 >> Lt radial aline 10/19 >> 10/23 Rt IJ CVL 10/20 >>   SIGNIFICANT EVENTS: 10/19 Admit, cardiology consulted 10/20 Neurology, nephrology consulted 10/21 Cardiology s/o 10/25 Burst suppression 10/26 - Tolerates pressure support when switched over. Continuous EEG at present with high dose propofol. Per neuro plans for slow wean propofol today ( 10/26) with faster wean 10/27 if no electrical seizure activity.     SUBJECTIVE/OVERNIGHT/INTERVAL HX 10/27 - RN stat called due to bradycardia in 30-50s mostly sinus but occ wide complex. Slow increasing pressor needs with levophed but abile to maintain BP easily. On diprivan for seizures . Deteriorating urine output. No family locatable  VITAL SIGNS: BP (!) 100/47   Pulse  (!) 39   Temp 99.1 F (37.3 C)   Resp (!) 22   Ht 6' (1.829 m)   Wt 92.9 kg (204 lb 12.9 oz)   SpO2 100%   BMI 27.78 kg/m   HEMODYNAMICS: CVP:  [4 mmHg-21 mmHg] 18 mmHg  VENTILATOR SETTINGS: Vent Mode: PRVC FiO2 (%):  [30 %] 30 % Set Rate:  [14 bmp] 14 bmp Vt Set:  [540 mL] 540 mL PEEP:  [5 cmH20] 5 cmH20 Plateau Pressure:  [20 HYQ65-78 cmH20] 23 cmH20  INTAKE / OUTPUT: I/O last 3 completed shifts: In: 4724.7 [I.V.:2120.4; Other:1000; NG/GT:1064.3; IV Piggyback:540] Out: 666 [Urine:665; Stool:1]  PHYSICAL EXAMINATION:  General Appearance:    Looks criticall il  Head:    Normocephalic, without obvious abnormality, atraumatic  Eyes:    PERRL - yes, conjunctiva/corneas - muddy      Ears:    Normal external ear canals, both ears  Nose:   NG tube - no  Throat:  ETT TUBE - no , OG tube - yes  Neck:   Supple,  No enlargement/tenderness/nodules     Lungs:     Clear to auscultation bilaterally, Ventilator   Synchrony - yes  Chest wall:    No deformity  Heart:    S1 and S2 normal, no murmur, CVP - no.  Pressors - yes on levophed 29mcg  Abdomen:     Soft, no masses, no organomegaly  Genitalia:    Not done  Rectal:   not done  Extremities:   Extremities- intact     Skin:   Intact in exposed areas . Sacral area - skin excoriation  on chest and Rt arm. . No sacral decub     Neurologic:   Sedation - diprivan -> RASS - -4 . Marland Kitchen cEEG ongoing    PULMONARY  Recent Labs Lab 03/02/17 1437 03/03/17 0320 03/04/17 0330 03/04/17 1159  PHART 7.433 7.433 7.546* 7.525*  PCO2ART 31.9* 28.4* 24.1* 29.8*  PO2ART 217.0* 178* 201* 251.0*  HCO3 21.2 18.7* 21.0 24.8  TCO2 22  --   --  26  O2SAT 100.0 99.3 99.7 100.0    CBC  Recent Labs Lab 03/07/17 0522 03/08/17 0420 03/09/17 0409  HGB 8.3* 8.0* 7.9*  HCT 26.5* 26.1* 26.6*  WBC 17.3* 18.8* 19.5*  PLT 208 229 284    COAGULATION No results for input(s): INR in the last 168 hours.  CARDIAC   Recent Labs Lab 03/02/17 1445  03/02/17 2013 03/03/17 0232  TROPONINI 0.86* 0.76* 0.63*   No results for input(s): PROBNP in the last 168 hours.   CHEMISTRY  Recent Labs Lab 03/02/17 1445 03/03/17 0500 03/04/17 0435 03/05/17 0514 03/06/17 0400 03/07/17 0522 03/08/17 0420 03/09/17 0409  NA 143 140 136 136 144 145 146* 142  K 4.9 4.6 3.4* 3.1* 3.5 3.3* 3.5 4.0  CL 112* 107 103 101 104 106 106 104  CO2 17* 18* 21* 20* 22 22 21* 18*  GLUCOSE 216* 292* 173* 226* 128* 129* 145* 145*  BUN 183* 165* 142* 135* 137* 134* 137* 141*  CREATININE 3.92* 3.79* 3.48* 3.25* 3.14* 3.01* 3.10* 3.20*  CALCIUM 7.0* 7.1* 6.8* 6.7* 7.1* 6.8* 7.3* 7.3*  MG 3.4* 3.0* 2.8* 2.7*  --   --   --   --   PHOS 5.5* 5.1* 4.9* 6.3* 5.9* 5.9* 6.0* 6.5*   Estimated Creatinine Clearance: 23.6 mL/min (A) (by C-G formula based on SCr of 3.2 mg/dL (H)).   LIVER  Recent Labs Lab 03/03/17 0500 03/04/17 0435 03/05/17 0514 03/06/17 0400 03/07/17 0522 03/08/17 0420 03/09/17 0409  AST 363* 298* 216*  --  104*  --  71*  ALT 81* 78* 73*  --  53  --  46  ALKPHOS 712* 682* 565*  --  604*  --  547*  BILITOT 2.2* 2.2* 2.6*  --  2.7*  --  2.3*  PROT 6.1* 6.1* 5.8*  --  6.2*  --  6.4*  ALBUMIN 1.5* 1.4* 1.3* 1.3* 1.3* 1.3* 1.2*     INFECTIOUS  Recent Labs Lab 03/02/17 1445  03/03/17 0500  03/04/17 0435 03/05/17 0514 03/06/17 0400  LATICACIDVEN  --   < >  --   < > 4.4* 3.0* 3.9*  PROCALCITON 8.47  --  8.20  --  5.49  --   --   < > = values in this interval not displayed.   ENDOCRINE CBG (last 3)   Recent Labs  03/08/17 2354 03/09/17 0418 03/09/17 0737  GLUCAP 107* 138* 152*         IMAGING x48h  - image(s) personally visualized  -   highlighted in bold No results found.     ASSESSMENT / PLAN:  Active Problems:   Acute renal failure (ARF) (HCC)   Unresponsive   Bradycardia, sinus   Pressure injury of skin   Acute respiratory failure with hypoxia (HCC)   Altered mental status   ASSESSMENT /  PLAN:  PULMONARY A:   03/09/2017 ->  Acute hypoxic respiratory failure. On 03/09/2017 - 03/09/2017 - > does NOT meet criteria for SBT/Extubation in setting of Acute Respiratory Failure due to acute  encephalopatht    P:   Full vent support  CARDIOVASCULAR A:  #baseline: baseline echo 03/03/17 - ef 65%   03/09/2017 -> bradycardia - sinus needing levophed. Possibly due to acidosis and diprivan. Circulatory shock +  P:  Dc diprivan Start bic gtt  RENAL  Intake/Output Summary (Last 24 hours) at 03/09/17 0756 Last data filed at 03/09/17 0600  Gross per 24 hour  Intake          3239.79 ml  Output              401 ml  Net          2838.79 ml    Recent Labs Lab 03/05/17 0514 03/06/17 0400 03/07/17 0522 03/08/17 0420 03/09/17 0409  CREATININE 3.25* 3.14* 3.01* 3.10* 3.20*     A:   #baseline: unclear #current Acute renal failure from volume depletion and ACE inhibitor >> uncertain what baseline renal fx is.  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  P:   lactulose  HEMATOLOGIC  Recent Labs Lab 03/07/17 0522 03/08/17 0420 03/09/17 0409  HGB 8.3* 8.0* 7.9*  HCT 26.5* 26.1* 26.6*  WBC 17.3* 18.8* 19.5*  PLT 208 229 284    A:   #RBC: anemia critial illness #Platelet normal #WBC sliwhglyt worsening leukocytosis  P:  - PRBC for hgb </= 6.9gm%    - exceptions are   -  if ACS susepcted/confirmed then transfuse for hgb </= 8.0gm%,  or    -  active bleeding with hemodynamic instability, then transfuse regardless of hemoglobin value   At at all times try to transfuse 1 unit prbc as possible with exception of active hemorrhage    INFECTIOUS  Recent Labs Lab 03/02/17 1445 03/03/17 0500 03/04/17 0435  PROCALCITON 8.47 8.20 5.49    Results for orders placed or performed during the hospital encounter of 02/27/2017  Blood culture (routine x 2)      Status: Abnormal   Collection Time: 03/11/2017 12:04 PM  Result Value Ref Range Status   Specimen Description BLOOD GROIN  Final   Special Requests   Final    BOTTLES DRAWN AEROBIC AND ANAEROBIC Blood Culture adequate volume   Culture  Setup Time   Final    GRAM POSITIVE COCCI IN CLUSTERS AEROBIC BOTTLE ONLY CRITICAL RESULT CALLED TO, READ BACK BY AND VERIFIED WITH: A MASTERS,PHARMD AT 9562 03/02/17 BY L BENFIELD    Culture (A)  Final    STAPHYLOCOCCUS SPECIES (COAGULASE NEGATIVE) THE SIGNIFICANCE OF ISOLATING THIS ORGANISM FROM A SINGLE SET OF BLOOD CULTURES WHEN MULTIPLE SETS ARE DRAWN IS UNCERTAIN. PLEASE NOTIFY THE MICROBIOLOGY DEPARTMENT WITHIN ONE WEEK IF SPECIATION AND SENSITIVITIES ARE REQUIRED.    Report Status 03/03/2017 FINAL  Final  Blood Culture ID Panel (Reflexed)     Status: Abnormal   Collection Time: 03/11/2017 12:04 PM  Result Value Ref Range Status   Enterococcus species NOT DETECTED NOT DETECTED Final   Listeria monocytogenes NOT DETECTED NOT DETECTED Final   Staphylococcus species DETECTED (A) NOT DETECTED Final    Comment: Methicillin (oxacillin) susceptible coagulase negative staphylococcus. Possible blood culture contaminant (unless isolated from more than one blood culture draw or clinical case suggests pathogenicity). No antibiotic treatment is indicated for blood  culture contaminants. CRITICAL RESULT CALLED TO, READ BACK BY AND VERIFIED WITH: A MASTERS,PHARMD AT 1308 03/02/17 BY L BENFIELD    Staphylococcus  aureus NOT DETECTED NOT DETECTED Final   Methicillin resistance NOT DETECTED NOT DETECTED Final   Streptococcus species NOT DETECTED NOT DETECTED Final   Streptococcus agalactiae NOT DETECTED NOT DETECTED Final   Streptococcus pneumoniae NOT DETECTED NOT DETECTED Final   Streptococcus pyogenes NOT DETECTED NOT DETECTED Final   Acinetobacter baumannii NOT DETECTED NOT DETECTED Final   Enterobacteriaceae species NOT DETECTED NOT DETECTED Final    Enterobacter cloacae complex NOT DETECTED NOT DETECTED Final   Escherichia coli NOT DETECTED NOT DETECTED Final   Klebsiella oxytoca NOT DETECTED NOT DETECTED Final   Klebsiella pneumoniae NOT DETECTED NOT DETECTED Final   Proteus species NOT DETECTED NOT DETECTED Final   Serratia marcescens NOT DETECTED NOT DETECTED Final   Haemophilus influenzae NOT DETECTED NOT DETECTED Final   Neisseria meningitidis NOT DETECTED NOT DETECTED Final   Pseudomonas aeruginosa NOT DETECTED NOT DETECTED Final   Candida albicans NOT DETECTED NOT DETECTED Final   Candida glabrata NOT DETECTED NOT DETECTED Final   Candida krusei NOT DETECTED NOT DETECTED Final   Candida parapsilosis NOT DETECTED NOT DETECTED Final   Candida tropicalis NOT DETECTED NOT DETECTED Final  Blood culture (routine x 2)     Status: None   Collection Time: 03/05/2017 12:19 PM  Result Value Ref Range Status   Specimen Description BLOOD RIGHT HAND  Final   Special Requests   Final    BOTTLES DRAWN AEROBIC AND ANAEROBIC Blood Culture adequate volume   Culture NO GROWTH 5 DAYS  Final   Report Status 03/06/2017 FINAL  Final  MRSA PCR Screening     Status: None   Collection Time: 02/23/2017  4:35 PM  Result Value Ref Range Status   MRSA by PCR NEGATIVE NEGATIVE Final    Comment:        The GeneXpert MRSA Assay (FDA approved for NASAL specimens only), is one component of a comprehensive MRSA colonization surveillance program. It is not intended to diagnose MRSA infection nor to guide or monitor treatment for MRSA infections.   Culture, respiratory (NON-Expectorated)     Status: None   Collection Time: 03/02/17  2:31 PM  Result Value Ref Range Status   Specimen Description TRACHEAL ASPIRATE  Final   Special Requests NONE  Final   Gram Stain   Final    ABUNDANT WBC PRESENT, PREDOMINANTLY PMN RARE SQUAMOUS EPITHELIAL CELLS PRESENT FEW GRAM POSITIVE COCCI IN PAIRS IN CLUSTERS RARE GRAM NEGATIVE RODS    Culture FEW ACINETOBACTER  CALCOACETICUS/BAUMANNII COMPLEX  Final   Report Status 03/04/2017 FINAL  Final   Organism ID, Bacteria ACINETOBACTER CALCOACETICUS/BAUMANNII COMPLEX  Final      Susceptibility   Acinetobacter calcoaceticus/baumannii complex - MIC*    CEFTAZIDIME 16 INTERMEDIATE Intermediate     CEFTRIAXONE 16 INTERMEDIATE Intermediate     CIPROFLOXACIN <=0.25 SENSITIVE Sensitive     GENTAMICIN <=1 SENSITIVE Sensitive     IMIPENEM <=0.25 SENSITIVE Sensitive     PIP/TAZO <=4 SENSITIVE Sensitive     TRIMETH/SULFA <=20 SENSITIVE Sensitive     CEFEPIME 8 SENSITIVE Sensitive     AMPICILLIN/SULBACTAM <=2 SENSITIVE Sensitive     * FEW ACINETOBACTER CALCOACETICUS/BAUMANNII COMPLEX  Culture, blood (routine x 2)     Status: None   Collection Time: 03/02/17  7:50 PM  Result Value Ref Range Status   Specimen Description BLOOD RIGHT ANTECUBITAL  Final   Special Requests IN PEDIATRIC BOTTLE Blood Culture adequate volume  Final   Culture NO GROWTH 5 DAYS  Final  Report Status 03/07/2017 FINAL  Final  Culture, blood (routine x 2)     Status: None   Collection Time: 03/02/17  8:12 PM  Result Value Ref Range Status   Specimen Description BLOOD LEFT ANTECUBITAL  Final   Special Requests IN PEDIATRIC BOTTLE Blood Culture adequate volume  Final   Culture NO GROWTH 5 DAYS  Final   Report Status 03/07/2017 FINAL  Final  CSF culture     Status: None   Collection Time: 03/04/17  2:53 PM  Result Value Ref Range Status   Specimen Description CSF  Final   Special Requests NONE  Final   Gram Stain   Final    WBC PRESENT, PREDOMINANTLY MONONUCLEAR NO ORGANISMS SEEN CYTOSPIN SMEAR    Culture NO GROWTH 3 DAYS  Final   Report Status 03/07/2017 FINAL  Final    A:   acenitobacter pneumonia P:   Anti-infectives    Start     Dose/Rate Route Frequency Ordered Stop   03/04/17 1400  piperacillin-tazobactam (ZOSYN) IVPB 3.375 g     3.375 g 12.5 mL/hr over 240 Minutes Intravenous Every 8 hours 03/04/17 0937 03/08/17 0154    03/03/17 1000  vancomycin (VANCOCIN) IVPB 1000 mg/200 mL premix  Status:  Discontinued     1,000 mg 200 mL/hr over 60 Minutes Intravenous  Once 03/02/17 1442 03/02/17 1619   03/03/2017 1200  vancomycin (VANCOCIN) 1,500 mg in sodium chloride 0.9 % 500 mL IVPB     1,500 mg 250 mL/hr over 120 Minutes Intravenous  Once 03/07/2017 1124 02/11/2017 1507   02/11/2017 1200  piperacillin-tazobactam (ZOSYN) IVPB 2.25 g  Status:  Discontinued     2.25 g 100 mL/hr over 30 Minutes Intravenous Every 8 hours 03/12/2017 1124 03/04/17 0937   02/15/2017 1115  piperacillin-tazobactam (ZOSYN) IVPB 3.375 g  Status:  Discontinued     3.375 g 100 mL/hr over 30 Minutes Intravenous  Once 02/20/2017 1106 02/27/2017 1124   02/28/2017 1115  vancomycin (VANCOCIN) IVPB 1000 mg/200 mL premix  Status:  Discontinued     1,000 mg 200 mL/hr over 60 Minutes Intravenous  Once 03/10/2017 1106 02/28/2017 1124       ENDOCRINE A:   hyperglycemia P:   ICU hyperglycemia protocol  NEUROLOGIC A:   #Baseline : Status epilepticus. Hx of ETOH. Elevated ammonia. Continuous EEG 10/26 - neuro to start burst suppression 10/25 - f/u CSF culture>> No growth    03/09/2017 -> remains obtunded. Diprivan ? Causing bradycarida P:   RASS goal: per neuro   MSK Deep tissue injuries Rt hand, Lt thigh, Rt shoulder. - present prior to admission - consult to wound nurse 10/26 - check CK 03/10/17   FAMILY  - Updates: 03/09/2017 --> no family at bedside but d/w Dr Katherine Roan  - Inter-disciplinary family meet or Palliative Care meeting due by:  DAy 7. Current LOS is LOS 8 days - Goals of care - full code.  No family.  Might need to gets ethics committee assistance if he doesn't have any improvement in neurologic status. CM and CSW are assigned to the patient. Ellan Lambert Case Manager [phone], and Barbette Or Chalco [phone]. Spoke with Barbette Or,  CSW. They have been unable to find any family. They are reaching out to the detective involved in  the case and if detectives  have no family information, the patient will need ethics consult and steps to make ward of the state. He will need assessment for trach PEG the week of 10/28  if he makes no further progress with weaning / mental status improvement.We will wait until neuro finishes with burst suppression and consider ethics consult early next week.     DISPO Keep in ICU   The patient is critically ill with multiple organ systems failure and requires high complexity decision making for assessment and support, frequent evaluation and titration of therapies, application of advanced monitoring technologies and extensive interpretation of multiple databases.   Critical Care Time devoted to patient care services described in this note is  30  Minutes. This time reflects time of care of this signee Dr Brand Males. This critical care time does not reflect procedure time, or teaching time or supervisory time of PA/NP/Med student/Med Resident etc but could involve care discussion time    Dr. Brand Males, M.D., [facility].C.P Pulmonary and Critical Care Medicine Staff Physician [facility] Pager: [phone], If no answer or between  15:00h - 7:00h: call [phone]  03/09/2017 7:56 AM

## 2017-03-09 NOTE — Progress Notes (Signed)
EEG maint complete    No skin breakdown under c4, p4, fp1, fp2, f4, f8

## 2017-03-09 NOTE — Progress Notes (Signed)
CSW consulted with Daphene Calamity, CSW director to discuss plan for identifying patient's family. CSW informed Hope Rife that previous CSW attempts had been made to find family that were unsuccessful. Discussed possibility of contacting [place] on Monday for any possible contact information listed in patient's chart due to him having Medicare.   CSW to continue to assist.  Madilyn Fireman, MSW, LCSW-A Weekend Clinical Social Worker [phone]
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
## 2017-03-09 NOTE — Progress Notes (Signed)
Subjective: Interval History: requiring pressors, nonresponsive on vent, lower urine output and bp  Objective: Vital signs in last 24 hours: Temp:  [96.8 F (36 C)-99.1 F (37.3 C)] 98.8 F (37.1 C) (10/27 0915) Pulse Rate:  [34-78] 57 (10/27 0915) Resp:  [15-27] 26 (10/27 0915) BP: (80-132)/(43-61) 116/51 (10/27 0915) SpO2:  [99 %-100 %] 100 % (10/27 0915) FiO2 (%):  [30 %] 30 % (10/27 0312) Weight:  [92.9 kg (204 lb 12.9 oz)] 92.9 kg (204 lb 12.9 oz) (10/27 0409) Weight change: 3.6 kg (7 lb 15 oz)  Intake/Output from previous day: 10/26 0701 - 10/27 0700 In: 3239.8 [I.V.:1415.5; NG/GT:824.3] Out: 401 [Urine:400; Stool:1] Intake/Output this shift: Total I/O In: 450 [I.V.:210; NG/GT:105; IV Piggyback:135] Out: 30 [Urine:30]  General appearance: nonresponsive on vent Resp: rales bibasilar and rhonchi bibasilar Cardio: S1, S2 normal and systolic murmur: systolic ejection 2/6, decrescendo at 2nd left intercostal space GI: pos bs, soft, liver down 4 cm Extremitie3+edema 3+  Lab Results:  Recent Labs  03/08/17 0420 03/09/17 0409  WBC 18.8* 19.5*  HGB 8.0* 7.9*  HCT 26.1* 26.6*  PLT 229 284   BMET:  Recent Labs  03/08/17 0420 03/09/17 0409  NA 146* 142  K 3.5 4.0  CL 106 104  CO2 21* 18*  GLUCOSE 145* 145*  BUN 137* 141*  CREATININE 3.10* 3.20*  CALCIUM 7.3* 7.3*    Recent Labs  03/07/17 0930  PTH 202*   Iron Studies:  Recent Labs  03/07/17 0930  IRON 12*  TIBC 134*    Studies/Results: Dg Chest Port 1 View  Result Date: 03/09/2017 CLINICAL DATA:  Respiratory failure. EXAM: PORTABLE CHEST 1 VIEW COMPARISON:  03/07/2017 and 03/05/2017. FINDINGS: 0602 hours. Mild patient rotation to the left. Allowing for this, the endotracheal tube, nasogastric tube and right IJ central venous catheter appear unchanged. The heart size and mediastinal contours are stable. There are lower lung volumes with increased vascular congestion and bibasilar atelectasis. There  is no focal airspace disease or significant pleural effusion. Vertical linear lucency projecting peripherally over the right upper lobe is attributed to a skin fold. There is no definite pneumothorax. IMPRESSION: Lower lung volumes with increased vascular congestion and bibasilar atelectasis. Stable support system. Electronically Signed   By: Richardean Sale M.D.   On: 03/09/2017 09:30    I have reviewed the patient's current medications.  Assessment/Plan: 1 CKD/AKI unknown baseline, low bp on ACEI.  Cr up with lower bps , now on NE, and getting fluid, has fluid overload.  Acidemic on iv Na bicarb.  Conservative mgmt only 2 Sepsis ?? pneu  3 Hypotension 4 DM controlled 5 Volume xs 6 VDRF MS primary issue 7 Enceph no better. P  Iv Na bicarb, support bp, follow Cr, If cont pos fluid balance, Lasix   LOS: 8 days   Dennis [name] 03/09/2017,11:36 AM

## 2017-03-09 NOTE — Procedures (Signed)
History: 71 year old male with status epilepticus  Sedation: Propofol  Technique: This is a 21 channel continuous scalp video-EEG performed at the bedside with bipolar and monopolar montages arranged in accordance to the international 10/20 system of electrode placement. One channel was dedicated to EKG recording.  This recorded from 10/25 11:20 AM until 10/26 at 7:30 AM.   Background: At the onset of recording, the patient's background consists predominantly of generalized low voltage irregular delta activity.  In addition there are periodic discharges with a frequency of 0.5-1 Hz maximal in the right posterior quadrant.  There is occasional fast activity associated with these discharges.  This pattern continued until following a progressive attenuation of the background frequencies the periodic discharges stopped around 12:45 PM.  From that point forward there is a burst suppression pattern with bursts consisting predominantly of alpha activity, lasting 1-2 seconds with interburst interval of 10-20 seconds.  This persisted throughout the remainder of the recording.  EEG Abnormalities: 1) lateralized periodic discharges associated with fast activity. 2) burst suppression pattern from 12:45 PM onwards  Clinical Interpretation: This EEG record evidence of very irritable epileptogenic zone in the right hemisphere, with an indeterminate pattern seen which can represent structural injury, postictal state, or ongoing seizure.  This was then replaced with a profound cerebral dysfunction consistent with a medically induced coma.  Roland Rack, MD [facility] [phone]  If 7pm- 7am, please page neurology on call as listed in [place].

## 2017-03-09 NOTE — Progress Notes (Signed)
Pt fluctuating from SB in 30s to wide complex (ventricular?) with a pulse. CCM paged. I asked Dr Annie Sable to please send provider to bedside ASAP. Pt placed on Zoll, in monitor mode.

## 2017-03-10 ENCOUNTER — Inpatient Hospital Stay (HOSPITAL_COMMUNITY): Payer: Medicare Other

## 2017-03-10 LAB — CBC WITH DIFFERENTIAL/PLATELET
BASOS PCT: 0 %
Basophils Absolute: 0 10*3/uL (ref 0.0–0.1)
EOS ABS: 0 10*3/uL (ref 0.0–0.7)
Eosinophils Relative: 0 %
HEMATOCRIT: 25.3 % — AB (ref 39.0–52.0)
Hemoglobin: 8 g/dL — ABNORMAL LOW (ref 13.0–17.0)
LYMPHS ABS: 1.7 10*3/uL (ref 0.7–4.0)
Lymphocytes Relative: 7 %
MCH: 25.4 pg — AB (ref 26.0–34.0)
MCHC: 31.6 g/dL (ref 30.0–36.0)
MCV: 80.3 fL (ref 78.0–100.0)
MONO ABS: 0.5 10*3/uL (ref 0.1–1.0)
Monocytes Relative: 2 %
NEUTROS ABS: 21.4 10*3/uL — AB (ref 1.7–7.7)
NEUTROS PCT: 91 %
PLATELETS: 296 10*3/uL (ref 150–400)
RBC: 3.15 MIL/uL — ABNORMAL LOW (ref 4.22–5.81)
RDW: 19.5 % — AB (ref 11.5–15.5)
WBC: 23.6 10*3/uL — ABNORMAL HIGH (ref 4.0–10.5)

## 2017-03-10 LAB — HEPATIC FUNCTION PANEL
ALBUMIN: 1.1 g/dL — AB (ref 3.5–5.0)
ALT: 44 U/L (ref 17–63)
AST: 80 U/L — AB (ref 15–41)
Alkaline Phosphatase: 419 U/L — ABNORMAL HIGH (ref 38–126)
Bilirubin, Direct: 2.7 mg/dL — ABNORMAL HIGH (ref 0.1–0.5)
Indirect Bilirubin: 1.5 mg/dL — ABNORMAL HIGH (ref 0.3–0.9)
TOTAL PROTEIN: 6.1 g/dL — AB (ref 6.5–8.1)
Total Bilirubin: 4.2 mg/dL — ABNORMAL HIGH (ref 0.3–1.2)

## 2017-03-10 LAB — RENAL FUNCTION PANEL
ALBUMIN: 1.1 g/dL — AB (ref 3.5–5.0)
Anion gap: 21 — ABNORMAL HIGH (ref 5–15)
BUN: 151 mg/dL — AB (ref 6–20)
CALCIUM: 7.1 mg/dL — AB (ref 8.9–10.3)
CO2: 18 mmol/L — ABNORMAL LOW (ref 22–32)
CREATININE: 3.18 mg/dL — AB (ref 0.61–1.24)
Chloride: 102 mmol/L (ref 101–111)
GFR calc Af Amer: 21 mL/min — ABNORMAL LOW (ref >60)
GFR, EST NON AFRICAN AMERICAN: 18 mL/min — AB (ref >60)
Glucose, Bld: 157 mg/dL — ABNORMAL HIGH (ref 65–99)
PHOSPHORUS: 6.4 mg/dL — AB (ref 2.5–4.6)
POTASSIUM: 3.2 mmol/L — AB (ref 3.5–5.1)
SODIUM: 141 mmol/L (ref 135–145)

## 2017-03-10 LAB — TRIGLYCERIDES: TRIGLYCERIDES: 204 mg/dL — AB (ref <150)

## 2017-03-10 LAB — PROTIME-INR
INR: 1.19
Prothrombin Time: 15 seconds (ref 11.4–15.2)

## 2017-03-10 LAB — GLUCOSE, CAPILLARY
GLUCOSE-CAPILLARY: 151 mg/dL — AB (ref 65–99)
GLUCOSE-CAPILLARY: 132 mg/dL — AB (ref 65–99)
GLUCOSE-CAPILLARY: 162 mg/dL — AB (ref 65–99)
Glucose-Capillary: 88 mg/dL (ref 65–99)
Glucose-Capillary: 165 mg/dL — ABNORMAL HIGH (ref 65–99)

## 2017-03-10 LAB — FIBRINOGEN: Fibrinogen: 788 mg/dL — ABNORMAL HIGH (ref 210–475)

## 2017-03-10 LAB — CK: CK TOTAL: 122 U/L (ref 49–397)

## 2017-03-10 LAB — LACTIC ACID, PLASMA: Lactic Acid, Venous: 5.2 mmol/L (ref 0.5–1.9)

## 2017-03-10 LAB — MAGNESIUM: Magnesium: 2.5 mg/dL — ABNORMAL HIGH (ref 1.7–2.4)

## 2017-03-10 LAB — PROCALCITONIN: PROCALCITONIN: 7.91 ng/mL

## 2017-03-10 LAB — APTT: aPTT: 37 seconds — ABNORMAL HIGH (ref 24–36)

## 2017-03-10 MED ORDER — POTASSIUM CHLORIDE 20 MEQ PO PACK
40.0000 meq | PACK | Freq: Once | ORAL | Status: AC
  Administered 2017-03-10: 40 meq via ORAL
  Filled 2017-03-10: qty 2

## 2017-03-10 MED ORDER — SODIUM CHLORIDE 0.9 % IV BOLUS (SEPSIS)
1000.0000 mL | Freq: Once | INTRAVENOUS | Status: AC
  Administered 2017-03-10: 1000 mL via INTRAVENOUS

## 2017-03-10 NOTE — Progress Notes (Signed)
Transported pt from 4N-24 to CT and back to 4N-24 without any complications. RN present during trip. Rt to cont to monitor.

## 2017-03-10 NOTE — Progress Notes (Addendum)
Subjective: No further seizures on EEG  Exam: Vitals:   03/10/17 1228 03/10/17 1230  BP: (!) 106/57 (!) 104/51  Pulse: (!) 53 (!) 51  Resp: 20 16  Temp:  97.7 F (36.5 C)  SpO2: 99% 100%   Gen: In bed, intubated Resp: Ventilated Abd: soft, nt  Neuro: MS: Does not open eyes or follow commands CN: Pupils are very sluggish, but do react minimally bilaterally, corneals present Motor: He minimally extends his left arm to noxious stimuli, does not move his other extremities Sensory: As above  Pertinent Labs: Cr 3.18  Impression: 71 yo M With new onset refractory status epilepticus.  I suspect that he was down at home seizing for quite some time as evidenced by the fact that he had pressure injuries.    He had continued periodic discharges, and with his clinical status not improving, it was considered that there was a possibility that this represented ongoing seizure and therefore due to his severe clinical status, he was started on propofol for burst suppression.  He was suppressed for 48 hours, following suppression he did not have resumption of his periodic discharges.  His pupils have been getting progressively sluggish, and I would favor repeating imaging.  Recommendations: 1) continue Keppra, renally dosed, continue Vimpat 200 mg twice daily, continue phenytoin 2) discontinue LTM monitoring 3) CT head  4) given his continued poor exam, I suspect that he has a fairly poor prognosis.  Family has not been able to be located, but social work is continuing to try.  Roland Rack, MD [facility] [phone]  If 7pm- 7am, please page neurology on call as listed in [place]. 03/10/2017  8:46 PM

## 2017-03-10 NOTE — Progress Notes (Addendum)
History: 71 year old male treated for status epilepticus  Sedation: propofol  Technique: This is a 21 channel continuoue scalp video-EEG performed at the bedside with bipolar and monopolar montages arranged in accordance to the international 10/20 system of electrode placement. One channel was dedicated to EKG recording.    Background: The background consists of a burst suppression pattern at the onset of recording.  This consists of intermittent runs of alpha and theta activity lasting 1-2 seconds followed by 8-10 seconds of suppression.  Towards the end of the recording, background begins to become slightly more continuous with irregular delta activity present between the bursts.  There are occasional epileptiform discharges from the right posterior quadrant appearing during the bursts.  Photic stimulation: Physiologic driving is not performed  EEG Abnormalities: 1) burst suppression patternburst-suppression  2) occasional right posterior quadrant epileptiform discharges.   Clinical Interpretation: This EEG is consistent with a profound generalized non-specific cerebral dysfunction(encephalopathy) as is seen with medically induced coma. There was no seizure recorded on this study.   Roland Rack, MD [facility] [phone]  If 7pm- 7am, please page neurology on call as listed in [place].

## 2017-03-10 NOTE — Procedures (Addendum)
History: 71 year old male treated for status epilepticus  Sedation: Propofol at the beginning of the recording followed by none  Technique: This is a 21 channel tenuous scalp vdeo EEG performed at the bedside with bipolar and monopolar montages arranged in accordance to the international 10/20 system of electrode placement. One channel was dedicated to EKG recording.    Background: The background consists of low voltage irregular delta activity.  The beginning of the recording there are also intermittent bursts of higher voltage delta activity.  There are occasional discharges in the right posterior quadrant which are not clearly epileptiform, but are consistent with the previous discharges seen that were periodic.Following cessation of sedation, the background consists of generalized irregular slow activity.   Photic stimulation: Physiologic driving is not performed  EEG Abnormalities: 1) occasional discharges in the right posterior quadrant  Clinical Interpretation: This EEG is consistent with a epileptogenic zone in the right posterior quadrant in the setting of a severe generalized non-specific cerebral dysfunction(encephalopathy).  Roland Rack, MD [facility] [phone]  If 7pm- 7am, please page neurology on call as listed in [place].

## 2017-03-10 NOTE — Progress Notes (Signed)
[facility] Progress Note Patient Name: Dennis [name] DOB: 30-Nov-1945 MRN: [id number]   Date of Service  03/10/2017  HPI/Events of Note  K+ = 3.2 and Creatinine = 3.18. Will hold off on K+ replacement d/t renal failure and 2. Lactic Acid = 6.0 --> 5.2. LVEF = 65% to 70%.   eICU Interventions  Will order: 1. Monitor CVP per protocol. 2. Bolus with 0.9 NaCl 1 liter IV over 1 hour now.      Intervention Category Major Interventions: Electrolyte abnormality - evaluation and management  Sommer,Steven Eugene 03/10/2017, 6:35 AM

## 2017-03-10 NOTE — Progress Notes (Signed)
Subjective: Interval History: nonresponsive on vent  Objective: Vital signs in last 24 hours: Temp:  [97.5 F (36.4 C)-99.7 F (37.6 C)] 98.2 F (36.8 C) (10/28 0900) Pulse Rate:  [37-59] 51 (10/28 0900) Resp:  [16-33] 25 (10/28 0900) BP: (97-141)/(43-69) 106/46 (10/28 0900) SpO2:  [98 %-100 %] 100 % (10/28 0900) FiO2 (%):  [30 %] 30 % (10/28 0900) Weight:  [96.8 kg (213 lb 6.5 oz)] 96.8 kg (213 lb 6.5 oz) (10/28 0500) Weight change: 3.9 kg (8 lb 9.6 oz)  Intake/Output from previous day: 10/27 0701 - 10/28 0700 In: 3490 [I.V.:1885; NG/GT:1005; IV Piggyback:600] Out: 390 [Urine:390] Intake/Output this shift: Total I/O In: 674 [I.V.:534; NG/GT:140] Out: -   General appearance: nonresponsive to stim Resp: rales bibasilar and rhonchi bibasilar Cardio: S1, S2 normal and systolic murmur: holosystolic 2/6, blowing at apex GI: pos bs, soft, liver down 6 cm, mild distension Extremitie3 +edema 3 +  Lab Results:  Recent Labs  03/09/17 0409 03/10/17 0504  WBC 19.5* 23.6*  HGB 7.9* 8.0*  HCT 26.6* 25.3*  PLT 284 296   BMET:  Recent Labs  03/09/17 0409 03/10/17 0504  NA 142 141  K 4.0 3.2*  CL 104 102  CO2 18* 18*  GLUCOSE 145* 157*  BUN 141* 151*  CREATININE 3.20* 3.18*  CALCIUM 7.3* 7.1*   No results for input(s): PTH in the last 72 hours. Iron Studies: No results for input(s): IRON, TIBC, TRANSFERRIN, FERRITIN in the last 72 hours.  Studies/Results: Dg Chest Port 1 View  Result Date: 03/10/2017 CLINICAL DATA:  ETT EXAM: PORTABLE CHEST 1 VIEW COMPARISON:  03/09/2017 FINDINGS: Endotracheal tube is in place, 5 point cm above the carina. Nasogastric tube is in place, tip off the image beyond the gastroesophageal junction. Right IJ central line tip overlies the superior vena cava. No evidence for pneumothorax. Improved aeration compared to prior study. No focal consolidations. IMPRESSION: Stable lines and tubes.  No pneumothorax.  Improved aeration. Electronically  Signed   By: Nolon Nations M.D.   On: 03/10/2017 07:12   Dg Chest Port 1 View  Result Date: 03/09/2017 CLINICAL DATA:  Respiratory failure. EXAM: PORTABLE CHEST 1 VIEW COMPARISON:  03/07/2017 and 03/05/2017. FINDINGS: 0602 hours. Mild patient rotation to the left. Allowing for this, the endotracheal tube, nasogastric tube and right IJ central venous catheter appear unchanged. The heart size and mediastinal contours are stable. There are lower lung volumes with increased vascular congestion and bibasilar atelectasis. There is no focal airspace disease or significant pleural effusion. Vertical linear lucency projecting peripherally over the right upper lobe is attributed to a skin fold. There is no definite pneumothorax. IMPRESSION: Lower lung volumes with increased vascular congestion and bibasilar atelectasis. Stable support system. Electronically Signed   By: Richardean Sale M.D.   On: 03/09/2017 09:30    I have reviewed the patient's current medications.  Assessment/Plan: 1 AKI vs CKD   Cr stable.  Bicarb stable on drip.  Relative oliguria, will check urine chem Vol xs on exam. ATN from low bp and ACEI.  NO evidence Rhabdo. BUN ^from TR and catabolic state 2 enceph not responsive 3 VDRF Ms and ? Vol 4 Anemia  5 Cirrhosis 6 Nutrition TF P iv Na bicarb, check urine chem , consider Lasix   LOS: 9 days   Jay Kempe L 03/10/2017,9:59 AM
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
## 2017-03-10 NOTE — Progress Notes (Signed)
PCCM Attending Note:  Labs noted with minimal elevation in aPTT but normal fibrinogen & INR. Suspect hypoalbuminemia is due to malnutrition rather than liver disease. Procalcitonin rising.   1. Continuing to hold subcutaneous heparin 2. Plan to re-culture for fever 3. Holding on antibiotics.  Sonia Baller Ashok Cordia, M.D. [facility] Pager:  [phone] After 7pm or if no response, call [phone] 3:16 PM 03/10/17

## 2017-03-11 ENCOUNTER — Inpatient Hospital Stay (HOSPITAL_COMMUNITY): Payer: Medicare Other

## 2017-03-11 DIAGNOSIS — N179 Acute kidney failure, unspecified: Secondary | ICD-10-CM

## 2017-03-11 LAB — BLOOD GAS, ARTERIAL
Acid-base deficit: 5.2 mmol/L — ABNORMAL HIGH (ref 0.0–2.0)
Bicarbonate: 18.3 mmol/L — ABNORMAL LOW (ref 20.0–28.0)
DRAWN BY: 308601
FIO2: 30
MECHVT: 540 mL
O2 Saturation: 93.9 %
PATIENT TEMPERATURE: 98.6
PEEP: 5 cmH2O
PO2 ART: 72.4 mmHg — AB (ref 83.0–108.0)
RATE: 14 resp/min
pCO2 arterial: 28.4 mmHg — ABNORMAL LOW (ref 32.0–48.0)
pH, Arterial: 7.427 (ref 7.350–7.450)

## 2017-03-11 LAB — CBC WITH DIFFERENTIAL/PLATELET
Basophils Absolute: 0 10*3/uL (ref 0.0–0.1)
Basophils Relative: 0 %
Eosinophils Absolute: 0.2 10*3/uL (ref 0.0–0.7)
Eosinophils Relative: 1 %
HCT: 24.6 % — ABNORMAL LOW (ref 39.0–52.0)
Hemoglobin: 7.6 g/dL — ABNORMAL LOW (ref 13.0–17.0)
Lymphocytes Relative: 7 %
Lymphs Abs: 1.1 10*3/uL (ref 0.7–4.0)
MCH: 24.6 pg — ABNORMAL LOW (ref 26.0–34.0)
MCHC: 30.9 g/dL (ref 30.0–36.0)
MCV: 79.6 fL (ref 78.0–100.0)
Monocytes Absolute: 0.3 10*3/uL (ref 0.1–1.0)
Monocytes Relative: 2 %
Neutro Abs: 14.4 10*3/uL — ABNORMAL HIGH (ref 1.7–7.7)
Neutrophils Relative %: 90 %
Platelets: 317 10*3/uL (ref 150–400)
RBC: 3.09 MIL/uL — ABNORMAL LOW (ref 4.22–5.81)
RDW: 20.2 % — ABNORMAL HIGH (ref 11.5–15.5)
WBC: 16 10*3/uL — ABNORMAL HIGH (ref 4.0–10.5)

## 2017-03-11 LAB — GLUCOSE, CAPILLARY
GLUCOSE-CAPILLARY: 111 mg/dL — AB (ref 65–99)
GLUCOSE-CAPILLARY: 149 mg/dL — AB (ref 65–99)
GLUCOSE-CAPILLARY: 78 mg/dL (ref 65–99)
Glucose-Capillary: 134 mg/dL — ABNORMAL HIGH (ref 65–99)
Glucose-Capillary: 131 mg/dL — ABNORMAL HIGH (ref 65–99)
Glucose-Capillary: 141 mg/dL — ABNORMAL HIGH (ref 65–99)
Glucose-Capillary: 45 mg/dL — ABNORMAL LOW (ref 65–99)

## 2017-03-11 LAB — RENAL FUNCTION PANEL
Albumin: <1 g/dL — ABNORMAL LOW (ref 3.5–5.0)
Anion gap: 20 — ABNORMAL HIGH (ref 5–15)
BUN: 156 mg/dL — AB (ref 6–20)
CHLORIDE: 101 mmol/L (ref 101–111)
CO2: 20 mmol/L — ABNORMAL LOW (ref 22–32)
Calcium: 7 mg/dL — ABNORMAL LOW (ref 8.9–10.3)
Creatinine, Ser: 3.04 mg/dL — ABNORMAL HIGH (ref 0.61–1.24)
GFR calc Af Amer: 22 mL/min — ABNORMAL LOW (ref >60)
GFR, EST NON AFRICAN AMERICAN: 19 mL/min — AB (ref >60)
Glucose, Bld: 172 mg/dL — ABNORMAL HIGH (ref 65–99)
POTASSIUM: 3.3 mmol/L — AB (ref 3.5–5.1)
Phosphorus: 6.4 mg/dL — ABNORMAL HIGH (ref 2.5–4.6)
Sodium: 141 mmol/L (ref 135–145)

## 2017-03-11 LAB — LACTIC ACID, PLASMA
LACTIC ACID, VENOUS: 5.8 mmol/L — AB (ref 0.5–1.9)
LACTIC ACID, VENOUS: 6 mmol/L — AB (ref 0.5–1.9)
Lactic Acid, Venous: 6.8 mmol/L (ref 0.5–1.9)

## 2017-03-11 LAB — PHENYTOIN LEVEL, TOTAL: Phenytoin Lvl: 13 ug/mL (ref 10.0–20.0)

## 2017-03-11 LAB — COOXEMETRY PANEL
Carboxyhemoglobin: 1.2 % (ref 0.5–1.5)
Methemoglobin: 1.3 % (ref 0.0–1.5)
O2 Saturation: 77.1 %
Total hemoglobin: 8 g/dL — ABNORMAL LOW (ref 12.0–16.0)

## 2017-03-11 LAB — MAGNESIUM: MAGNESIUM: 2.4 mg/dL (ref 1.7–2.4)

## 2017-03-11 LAB — PROCALCITONIN: Procalcitonin: 11.28 ng/mL

## 2017-03-11 LAB — C DIFFICILE QUICK SCREEN W PCR REFLEX
C DIFFICILE (CDIFF) TOXIN: NEGATIVE
C DIFFICLE (CDIFF) ANTIGEN: NEGATIVE
C Diff interpretation: NOT DETECTED

## 2017-03-11 MED ORDER — SODIUM BICARBONATE 650 MG PO TABS
1300.0000 mg | ORAL_TABLET | Freq: Three times a day (TID) | ORAL | Status: DC
  Administered 2017-03-11 (×3): 1300 mg via ORAL
  Filled 2017-03-11 (×3): qty 2

## 2017-03-11 MED ORDER — POTASSIUM CHLORIDE 20 MEQ/15ML (10%) PO SOLN
40.0000 meq | Freq: Once | ORAL | Status: AC
  Administered 2017-03-11: 40 meq
  Filled 2017-03-11: qty 30

## 2017-03-11 MED ORDER — DEXTROSE 50 % IV SOLN
25.0000 mL | Freq: Once | INTRAVENOUS | Status: AC
  Administered 2017-03-11: 25 mL via INTRAVENOUS
  Filled 2017-03-11: qty 50

## 2017-03-11 NOTE — Progress Notes (Signed)
[facility] Progress Note Patient Name: Dennis [name] DOB: 07/23/1945 MRN: [id number]   Date of Service  03/11/2017  HPI/Events of Note  Lactic Acid = 5.2 --> 5.8. LVEF on recent Echo = 65% to 70%. BP = 101/48 with MAP = 64. Hgb = 7.6. CVP = 18.   eICU Interventions  Will order: 1. COOX now.      Intervention Category Major Interventions: Acid-Base disturbance - evaluation and management  Zyah Gomm Eugene 03/11/2017, 6:26 AM

## 2017-03-11 NOTE — Care Management Note (Signed)
Case Management Note  Patient Details  Name: Dennis [name] MRN: [id number] Date of Birth: [birth year]  Subjective/Objective:   71 yo male found unresponsive at home, hypothermic, VDRF, bradycardia, acute renal failure, seizure.  PTA, pt resided at home; widowed, per report.                   Action/Plan: Pt remains unresponsive on ventilator.  He has no family to assist with decision making.  CSW has been unable to locate any living family members at this time.     Expected Discharge Date:                  Expected Discharge Plan:     In-House Referral:  Clinical Social Work  Discharge planning Services  CM Consult  Post Acute Care Choice:    Choice offered to:     DME Arranged:    DME Agency:     HH Arranged:    HH Agency:     Status of Service:  In process, will continue to follow  If discussed at Long Length of Stay Meetings, dates discussed:    Additional Comments:  03/11/17 J. Paxtyn Boyar, RN, BSN No family has been able to be located by staff or CSW, after numerous attempts.  Ethics committee has been consulted for assistance with pt, he has made no progress neurologically.  Attending MD requesting court-appointed guardian be assigned to assist with decision making for pt; CSW consulted for possible guardianship.   Reinaldo Raddle, RN, BSN  Trauma/Neuro ICU Case Manager [phone]

## 2017-03-11 NOTE — Progress Notes (Signed)
Rt attempted ABG twice on pt without any success. No palpable pulse and doppler was used. Called charge RT to attempt. RN aware.

## 2017-03-11 NOTE — Progress Notes (Signed)
CRITICAL VALUE ALERT  Critical Value:  Lactic 6.0  Date & Time Notied:  03/11/17 1100  Provider Notified: Dr. Jimmey Ralph  Orders Received/Actions taken: No new orders at this time.

## 2017-03-11 NOTE — Clinical Social Work Note (Signed)
Clinical Social Worker spoke with Dennis [name] from Ethics committee regarding consult placed by MD.  There is question about possible guardianship vs. Futility policy.  Per Ethics Committee, this case does currently reflect the futility policy and the Ethics Committee representative will follow up with MD and Palliative Care Services.  CSW updated RN and remains available for support as needed.  Barbette Or, [place]

## 2017-03-11 NOTE — Progress Notes (Signed)
Paged MD regarding emesis occurrence. Orders given to check gastric residual. Gastric residual 200.

## 2017-03-11 NOTE — Progress Notes (Signed)
[facility] Progress Note    Assessment/ Plan:   1 AKI vs CKD   Cr and UOP both slightly improved.  Vol xs on exam. ATN from low bp and ACEI.  NO evidence Rhabdo. BUN ^from TR and catabolic state.  Poor candidate for RRT. Change IV bicarb to per tube 2 enceph not responsive- status epilepticus, neuro following. 3 VDRF Ms and ? Vol 4 Anemia  5 Cirrhosis 6 Nutrition TF  Subjective:    Continues to be obtunded.  Not triggering breaths on vent.   Objective:   BP 100/60   Pulse 62   Temp 99.3 F (37.4 C)   Resp (!) 22   Ht 6' (1.829 m)   Wt 101.6 kg (223 lb 15.8 oz)   SpO2 100%   BMI 30.38 kg/m   Intake/Output Summary (Last 24 hours) at 03/11/17 1771 Last data filed at 03/11/17 0700  Gross per 24 hour  Intake          3761.39 ml  Output              635 ml  Net          3126.39 ml   Weight change: 4.8 kg (10 lb 9.3 oz)  Physical Exam: General appearance: nonresponsive to stim Resp: bilateral rhonchi and crackles Cardio: S1, S2 normal, + II/VI systolic murmur GI: + distended, hypoactive bowel sounds Extremitie3 +edema 3 + SKIN: + DTIs  Imaging: Ct Head Wo Contrast  Result Date: 03/10/2017 CLINICAL DATA:  Altered level of consciousness. Refractory status epilepticus. EXAM: CT HEAD WITHOUT CONTRAST TECHNIQUE: Contiguous axial images were obtained from the base of the skull through the vertex without intravenous contrast. COMPARISON:  MRI of the head March 06, 2017 and CT HEAD March 06, 2017 FINDINGS: BRAIN: Persistent blurring of the RIGHT frontotemporal lobes including RIGHT insula and hippocampus gray-white matter differentiation, subtle involvement of the RIGHT thalamus better demonstrated on prior MRI. No intraparenchymal hemorrhage, midline shift or acute large vascular territory infarcts. Ventricles and sulci are overall normal for patient's age. No abnormal extra-axial fluid collections. VASCULAR: Mild calcific atherosclerosis of the carotid  siphons. SKULL: No skull fracture. No significant scalp soft tissue swelling. SINUSES/ORBITS: Mild sphenoid and maxillary sinusitis. Small bilateral mastoid effusions, middle ear effusions. Soft tissue within the RIGHT greater than LEFT external auditory canals could reflect cerumen. The included ocular globes and orbital contents are non-suspicious. OTHER: None. IMPRESSION: 1. Persistently edematous RIGHT frontotemporal lobes including RIGHT insula and hippocampus, and RIGHT thalamus. Findings seen with status epilepticus, underlying potential infectious or inflammatory encephalitis. 2. Persistent middle ear mastoid effusions. Electronically Signed   By: Elon Alas M.D.   On: 03/10/2017 23:07   Dg Chest Port 1 View  Result Date: 03/11/2017 CLINICAL DATA:  Acute respiratory failure with hypoxia, altered mental status, intubated patient. EXAM: PORTABLE CHEST 1 VIEW COMPARISON:  Portable chest x-ray of March 10, 2017 FINDINGS: The lungs are mildly hypoinflated. There is no focal infiltrate. There is no pleural effusion or pneumothorax. The heart and pulmonary vascularity are normal. The endotracheal tube tip lies approximately 5.9 cm above the carina. The esophagogastric tube tip in proximal port project below the inferior margin of the image. The right internal jugular venous catheter tip projects over the distal third of the SVC. IMPRESSION: Mild hypoinflation. No acute cardiopulmonary abnormality. The support tubes are in reasonable position. Electronically Signed   By: David  Martinique M.D.   On: 03/11/2017 07:57   Dg Chest Port 1  View  Result Date: 03/10/2017 CLINICAL DATA:  ETT EXAM: PORTABLE CHEST 1 VIEW COMPARISON:  03/09/2017 FINDINGS: Endotracheal tube is in place, 5 point cm above the carina. Nasogastric tube is in place, tip off the image beyond the gastroesophageal junction. Right IJ central line tip overlies the superior vena cava. No evidence for pneumothorax. Improved aeration compared  to prior study. No focal consolidations. IMPRESSION: Stable lines and tubes.  No pneumothorax.  Improved aeration. Electronically Signed   By: Nolon Nations M.D.   On: 03/10/2017 07:12    Labs: BMET  Recent Labs Lab 03/05/17 0514 03/06/17 0400 03/07/17 0522 03/08/17 0420 03/09/17 0409 03/10/17 0504 03/11/17 0506  NA 136 144 145 146* 142 141 141  K 3.1* 3.5 3.3* 3.5 4.0 3.2* 3.3*  CL 101 104 106 106 104 102 101  CO2 20* 22 22 21* 18* 18* 20*  GLUCOSE 226* 128* 129* 145* 145* 157* 172*  BUN 135* 137* 134* 137* 141* 151* 156*  CREATININE 3.25* 3.14* 3.01* 3.10* 3.20* 3.18* 3.04*  CALCIUM 6.7* 7.1* 6.8* 7.3* 7.3* 7.1* 7.0*  PHOS 6.3* 5.9* 5.9* 6.0* 6.5* 6.4* 6.4*   CBC  Recent Labs Lab 03/05/17 0514  03/08/17 0420 03/09/17 0409 03/10/17 0504 03/11/17 0506  WBC 14.2*  < > 18.8* 19.5* 23.6* 16.0*  NEUTROABS 12.7*  --   --   --  21.4* 14.4*  HGB 9.0*  < > 8.0* 7.9* 8.0* 7.6*  HCT 28.2*  < > 26.1* 26.6* 25.3* 24.6*  MCV 76.0*  < > 80.6 81.1 80.3 79.6  PLT 156  < > 229 284 296 317  < > = values in this interval not displayed.  Medications:    . aspirin  81 mg Per Tube Daily  . chlorhexidine gluconate (MEDLINE KIT)  15 mL Mouth Rinse BID  . Chlorhexidine Gluconate Cloth  6 each Topical Daily  . feeding supplement (NEPRO CARB STEADY)  1,000 mL Per Tube Q24H  . feeding supplement (PRO-STAT SUGAR FREE 64)  60 mL Per Tube BID  . folic acid  1 mg Per Tube Daily  . free water  100 mL Per Tube Q8H  . insulin aspart  0-20 Units Subcutaneous Q4H  . lactulose  30 g Per Tube BID  . mouth rinse  15 mL Mouth Rinse 10 times per day  . multivitamin  15 mL Per Tube Daily  . pantoprazole sodium  40 mg Per Tube Daily  . phenytoin (DILANTIN) IV  100 mg Intravenous Q8H  . silver sulfADIAZINE   Topical BID  . sodium chloride flush  10-40 mL Intracatheter Q12H  . thiamine  100 mg Per Tube Daily      Madelon Lips MD [facility] pgr [phone] 03/11/2017, 9:39  AM

## 2017-03-11 NOTE — Ethics Note (Signed)
Ethics Note:  I am working with Nicholas Lose on-call and reaching out to SW re: question of whether they have started the process of seeking DSS as Guardian. If this case fits the futility policy there is probably no need for a guardian to be assigned. I will follow up and either Lenor Coffin or I will respond to request for our recommendations.    Thane Edu.  #[id number]
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
## 2017-03-11 NOTE — Progress Notes (Signed)
CRITICAL VALUE ALERT  Critical Value:  Lactic Acid 6.8  Date & Time Notied:  03/11/17  Provider Notified: Dr. Jimmey Ralph  Orders Received/Actions taken: No new orders at this time

## 2017-03-11 NOTE — Progress Notes (Signed)
Charge RT called to attempt ABG on pt with no success with 2 attempts.  RN aware.

## 2017-03-11 NOTE — Progress Notes (Addendum)
Neurology Progress Note   S:// Seen and examined.  No new events overnight.  O:// Current vital signs: BP 100/60   Pulse 62   Temp 99.3 F (37.4 C)   Resp (!) 22   Ht 6' (1.829 m)   Wt 101.6 kg (223 lb 15.8 oz)   SpO2 100%   BMI 30.38 kg/m  Vital signs in last 24 hours: Temp:  [97.7 F (36.5 C)-99.5 F (37.5 C)] 99.3 F (37.4 C) (10/29 0900) Pulse Rate:  [50-63] 62 (10/29 0900) Resp:  [16-31] 22 (10/29 0900) BP: (72-114)/(39-67) 100/60 (10/29 0900) SpO2:  [99 %-100 %] 100 % (10/29 0900) FiO2 (%):  [30 %] 30 % (10/29 0852) Weight:  [101.6 kg (223 lb 15.8 oz)] 101.6 kg (223 lb 15.8 oz) (10/29 0500) GENERAL: Intubated, no sedation LUNGS - Clear to auscultation bilaterally with no wheezes CV - S1S2 RRR, no m/r/g, equal pulses bilaterally. NEURO:  Mental Status: Intubated, no sedation, does not open eyes to voice or noxious stim, does not follow any commands. Cranial Nerves: Pupils are 3 mm and very minimally reactive today, no corneals on my exam, he has minimal cough and gag Motor: No spontaneous movement.  No withdrawal to noxious stim.   Medications  Current Facility-Administered Medications:  .  albuterol (PROVENTIL) (2.5 MG/3ML) 0.083% nebulizer solution 2.5 mg, 2.5 mg, Nebulization, Q2H PRN, Hammonds, Sharyn Blitz, MD .  artificial tears (LACRILUBE) ophthalmic ointment, , Both Eyes, Q4H PRN, Greta Doom, MD, 1 application at 48/01/65 [phone] .  aspirin chewable tablet 81 mg, 81 mg, Per Tube, Daily, Chesley Mires, MD, 81 mg at 03/10/17 1000 .  chlorhexidine gluconate (MEDLINE KIT) (PERIDEX) 0.12 % solution 15 mL, 15 mL, Mouth Rinse, BID, Hammonds, Sharyn Blitz, MD, 15 mL at 03/11/17 0800 .  Chlorhexidine Gluconate Cloth 2 % PADS 6 each, 6 each, Topical, Daily, Hammonds, Sharyn Blitz, MD, 6 each at 03/10/17 1000 .  DOPamine (INTROPIN) 800 mg in dextrose 5 % 250 mL (3.2 mg/mL) infusion, 0-20 mcg/kg/min, Intravenous, Titrated, Javier Glazier, MD, Last Rate: 34.8 mL/hr  at 03/11/17 0926, 20 mcg/kg/min at 03/11/17 0926 .  feeding supplement (NEPRO CARB STEADY) liquid 1,000 mL, 1,000 mL, Per Tube, Q24H, Sood, Vineet, MD, Last Rate: 35 mL/hr at 03/10/17 2356, 1,000 mL at 03/10/17 2356 .  feeding supplement (PRO-STAT SUGAR FREE 64) liquid 60 mL, 60 mL, Per Tube, BID, Chesley Mires, MD, 60 mL at 03/10/17 2140 .  folic acid (FOLVITE) tablet 1 mg, 1 mg, Per Tube, Daily, Sood, Vineet, MD, 1 mg at 03/10/17 1100 .  free water 100 mL, 100 mL, Per Tube, Q8H, Magdalen Spatz, NP, 100 mL at 03/11/17 0600 .  insulin aspart (novoLOG) injection 0-20 Units, 0-20 Units, Subcutaneous, Q4H, Hammonds, Sharyn Blitz, MD, 4 Units at 03/11/17 [phone]  lacosamide (VIMPAT) 200 mg in sodium chloride 0.9 % 25 mL IVPB, 200 mg, Intravenous, Q12H, Greta Doom, MD, Stopped at 03/10/17 2208 .  lactulose (CHRONULAC) 10 GM/15ML solution 30 g, 30 g, Per Tube, BID, Javier Glazier, MD, 30 g at 03/10/17 2139 .  levETIRAcetam (KEPPRA) 500 mg in sodium chloride 0.9 % 100 mL IVPB, 500 mg, Intravenous, Q12H, Kinsinger, Arta Bruce, MD, Stopped at 03/11/17 0011 .  MEDLINE mouth rinse, 15 mL, Mouth Rinse, 10 times per day, Chesley Mires, MD, 15 mL at 03/11/17 0521 .  multivitamin liquid 15 mL, 15 mL, Per Tube, Daily, Halford Chessman, Vineet, MD, 15 mL at 03/10/17 1100 .  norepinephrine (LEVOPHED) 4  mg in dextrose 5 % 250 mL (0.016 mg/mL) infusion, 0-40 mcg/min, Intravenous, Titrated, Sood, Vineet, MD, Last Rate: 45 mL/hr at 03/09/17 1216, 12 mcg/min at 03/09/17 1216 .  pantoprazole sodium (PROTONIX) 40 mg/20 mL oral suspension 40 mg, 40 mg, Per Tube, Daily, Sood, Vineet, MD, 40 mg at 03/10/17 1100 .  phenytoin (DILANTIN) injection 100 mg, 100 mg, Intravenous, Q8H, Greta Doom, MD, 100 mg at 03/11/17 0518 .  silver sulfADIAZINE (SILVADENE) 1 % cream, , Topical, BID, Amie Portland, MD, 1 application at 81/27/51 2155 .  sodium bicarbonate tablet 1,300 mg, 1,300 mg, Oral, TID, Madelon Lips, MD .  sodium  chloride flush (NS) 0.9 % injection 10-40 mL, 10-40 mL, Intracatheter, Q12H, Hammonds, Sharyn Blitz, MD, 10 mL at 03/10/17 2155 .  thiamine (VITAMIN B-1) tablet 100 mg, 100 mg, Per Tube, Daily, Sood, Vineet, MD, 100 mg at 03/10/17 1100 Labs CBC    Component Value Date/Time   WBC 16.0 (H) 03/11/2017 0506   RBC 3.09 (L) 03/11/2017 0506   HGB 7.6 (L) 03/11/2017 0506   HCT 24.6 (L) 03/11/2017 0506   PLT 317 03/11/2017 0506   MCV 79.6 03/11/2017 0506   MCH 24.6 (L) 03/11/2017 0506   MCHC 30.9 03/11/2017 0506   RDW 20.2 (H) 03/11/2017 0506   LYMPHSABS 1.1 03/11/2017 0506   MONOABS 0.3 03/11/2017 0506   EOSABS 0.2 03/11/2017 0506   BASOSABS 0.0 03/11/2017 0506    CMP     Component Value Date/Time   NA 141 03/11/2017 0506   K 3.3 (L) 03/11/2017 0506   CL 101 03/11/2017 0506   CO2 20 (L) 03/11/2017 0506   GLUCOSE 172 (H) 03/11/2017 0506   BUN 156 (H) 03/11/2017 0506   CREATININE 3.04 (H) 03/11/2017 0506   CALCIUM 7.0 (L) 03/11/2017 0506   PROT 6.1 (L) 03/10/2017 1251   ALBUMIN <1.0 (L) 03/11/2017 0506   AST 80 (H) 03/10/2017 1251   ALT 44 03/10/2017 1251   ALKPHOS 419 (H) 03/10/2017 1251   BILITOT 4.2 (H) 03/10/2017 1251   GFRNONAA 19 (L) 03/11/2017 0506   GFRAA 22 (L) 03/11/2017 0506   Lumbar puncture showed glucose of 92, 0 RBCs, 0 WBCs and a protein count of 115.   Imaging I have reviewed images in epic and the results pertinent to this consultation are: MRI brain 10/24 IMPRESSION: 1. Resolving signal abnormality predominately involving the RIGHT cerebrum, persistently edematous RIGHT hippocampus. Findings favor resolving status epilepticus. Potential underlying infection including HSV, inflammatory encephalitis or toxic encephalopathy from hyperammonemia possible . Unlikely to reflect ischemic changes considering distribution. CT head 10/28 IMPRESSION: 1. Persistently edematous RIGHT frontotemporal lobes including RIGHT insula and hippocampus, and RIGHT thalamus.  Findings seen with status epilepticus, underlying potential infectious or inflammatory encephalitis. 2. Persistent middle ear mastoid effusions.  Assessment:  71 year old man with new onset refractory status epilepticus, who was essentially brought in from home found down with what appeared to be pressure sores because of prolonged downtime causing pressure injuries, will continue to have periodic discharges with clinical status being poor.  He was on propofol for burst suppression for 48 hours and continuous EEG 48 hours after burst suppression did not show epileptiform activity. MRI of the brain showed resolving signal abnormality in the right cerebrum and hippocampus on 03/06/2017 and a repeat head CT on 03/10/2017 showed persistently edematous right frontotemporal lobes including the right insula and hippocampus along with right thalamus. He has been seizure-free based on his EEGs and has had a epileptogenic  zone without actually demonstrating any electric seizures in the right posterior quadrant, but continues to have a comatose exam with his pupils becoming more sluggish and unable to elicit corneal reflexes. Given the fact that has neurological exam is not changed in many days and has not improved after obtaining per suppression and treating nonconvulsive status epilepticus, prognosis remains poor There is no family that has been able to be contacted despite attempts being made by social work.  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 Brain imaging does reveal persistent changes in the right hippocampal and right cerebral hemisphere - changes related to seizure edema. At this time, I am not sure if pursuing aggressive measures will lead to any  good outcome. If family can not be reached, ethics consult for withdrawal of care should be considered. Till then, continue with current AEDs and supportive treatment per primary team as you are.  -- Amie Portland, MD [facility] [phone] If 7pm to 7am, please call on call as listed on AMION.   Addendum Dilantin levels are toxic Pharmacy on board.  Discontinue Dilantin for now. Check daily Dilantin levels

## 2017-03-12 LAB — CBC WITH DIFFERENTIAL/PLATELET
BASOS ABS: 0 10*3/uL (ref 0.0–0.1)
Basophils Relative: 0 %
Eosinophils Absolute: 0.2 10*3/uL (ref 0.0–0.7)
Eosinophils Relative: 1 %
HEMATOCRIT: 22.7 % — AB (ref 39.0–52.0)
Hemoglobin: 7.1 g/dL — ABNORMAL LOW (ref 13.0–17.0)
LYMPHS ABS: 1.6 10*3/uL (ref 0.7–4.0)
Lymphocytes Relative: 9 %
MCH: 24.9 pg — AB (ref 26.0–34.0)
MCHC: 31.3 g/dL (ref 30.0–36.0)
MCV: 79.6 fL (ref 78.0–100.0)
MONOS PCT: 4 %
Monocytes Absolute: 0.7 10*3/uL (ref 0.1–1.0)
Neutro Abs: 15.1 10*3/uL — ABNORMAL HIGH (ref 1.7–7.7)
Neutrophils Relative %: 86 %
Platelets: 307 10*3/uL (ref 150–400)
RBC: 2.85 MIL/uL — ABNORMAL LOW (ref 4.22–5.81)
RDW: 19.7 % — AB (ref 11.5–15.5)
WBC: 17.6 10*3/uL — AB (ref 4.0–10.5)

## 2017-03-12 LAB — BASIC METABOLIC PANEL
Anion gap: 21 — ABNORMAL HIGH (ref 5–15)
BUN: 170 mg/dL — ABNORMAL HIGH (ref 6–20)
CALCIUM: 7.1 mg/dL — AB (ref 8.9–10.3)
CHLORIDE: 100 mmol/L — AB (ref 101–111)
CO2: 19 mmol/L — ABNORMAL LOW (ref 22–32)
CREATININE: 3.34 mg/dL — AB (ref 0.61–1.24)
GFR, EST AFRICAN AMERICAN: 20 mL/min — AB (ref >60)
GFR, EST NON AFRICAN AMERICAN: 17 mL/min — AB (ref >60)
Glucose, Bld: 89 mg/dL (ref 65–99)
Potassium: 3.8 mmol/L (ref 3.5–5.1)
SODIUM: 140 mmol/L (ref 135–145)

## 2017-03-12 LAB — GLUCOSE, CAPILLARY
GLUCOSE-CAPILLARY: 119 mg/dL — AB (ref 65–99)
GLUCOSE-CAPILLARY: 68 mg/dL (ref 65–99)
GLUCOSE-CAPILLARY: 104 mg/dL — AB (ref 65–99)
GLUCOSE-CAPILLARY: 96 mg/dL (ref 65–99)
Glucose-Capillary: 162 mg/dL — ABNORMAL HIGH (ref 65–99)
Glucose-Capillary: 68 mg/dL (ref 65–99)
Glucose-Capillary: 75 mg/dL (ref 65–99)
Glucose-Capillary: 75 mg/dL (ref 65–99)
Glucose-Capillary: 104 mg/dL — ABNORMAL HIGH (ref 65–99)
Glucose-Capillary: 113 mg/dL — ABNORMAL HIGH (ref 65–99)

## 2017-03-12 LAB — PHOSPHORUS: PHOSPHORUS: 7.2 mg/dL — AB (ref 2.5–4.6)

## 2017-03-12 LAB — MAGNESIUM: Magnesium: 2.4 mg/dL (ref 1.7–2.4)

## 2017-03-12 LAB — PROCALCITONIN: PROCALCITONIN: 13.24 ng/mL

## 2017-03-12 MED ORDER — PANTOPRAZOLE SODIUM 40 MG IV SOLR
40.0000 mg | INTRAVENOUS | Status: DC
  Administered 2017-03-12 – 2017-03-13 (×2): 40 mg via INTRAVENOUS
  Filled 2017-03-12 (×3): qty 40

## 2017-03-12 MED ORDER — DEXTROSE 50 % IV SOLN
25.0000 mL | Freq: Once | INTRAVENOUS | Status: AC
  Administered 2017-03-12: 25 mL via INTRAVENOUS

## 2017-03-12 MED ORDER — DEXTROSE 50 % IV SOLN
25.0000 mL | Freq: Once | INTRAVENOUS | Status: AC
  Administered 2017-03-12: 25 mL via INTRAVENOUS
  Filled 2017-03-12: qty 50

## 2017-03-12 MED ORDER — DEXTROSE 5 % IV SOLN
INTRAVENOUS | Status: DC
  Administered 2017-03-12 – 2017-03-13 (×2): via INTRAVENOUS

## 2017-03-12 MED ORDER — DEXTROSE 50 % IV SOLN
INTRAVENOUS | Status: AC
  Filled 2017-03-12: qty 50

## 2017-03-12 NOTE — Progress Notes (Signed)
[facility] Progress Note Patient Name: DAIMEN SHOVLIN DOB: 06/22/1945 MRN: [id number]   Date of Service  03/12/2017  HPI/Events of Note  Hypoglycemia - Blood glucose has dropped X 2 to 45 and 68.  eICU Interventions  Will order: 1. D5W to run IV at 50 mL/hour.     Intervention Category Major Interventions: Other:  Shekinah Pitones Cornelia Copa 03/12/2017, 3:31 AM

## 2017-03-12 NOTE — Progress Notes (Signed)
Notified Dr. Halford Chessman of pt's MAP being under 33.  Patient is already at maximum of Dopamine gtt.  We will not add any new meds due to patient being a DNR.

## 2017-03-12 NOTE — Progress Notes (Signed)
Tubes feeds held due to large amount of emesis from mouth and possible aspiration. 600 mL of tube feed/bile out immediately after hooking up OG to low wall suction. CCM called and notified of this and hypoglycemia. Will start patient on D5.

## 2017-03-12 NOTE — Progress Notes (Signed)
PULMONARY / CRITICAL CARE MEDICINE   Name: Dennis [name] MRN: [id number] DOB: [birth year]    ADMISSION DATE:  03/12/2017 CONSULTATION DATE:  03/02/2017  REFERRING MD:  Dr. Kieth Brightly, Trauma  CHIEF COMPLAINT:  Respiratory failure  HISTORY OF PRESENT ILLNESS:   71 yo male found unresponsive at home, hypothermic, VDRF, bradycardia, acute renal failure, seizure. PMHx of ETOH, HCV, Pancreatitis, DM, HTN, CKD 2.  SUBJECTIVE:  Remains on pressors.  VITAL SIGNS: BP (!) 92/47   Pulse 62   Temp 99.1 F (37.3 C)   Resp 15   Ht 6' (1.829 m)   Wt 223 lb 15.8 oz (101.6 kg)   SpO2 100%   BMI 30.38 kg/m   HEMODYNAMICS: CVP:  [14 mmHg-19 mmHg] 18 mmHg  VENTILATOR SETTINGS: Vent Mode: PRVC FiO2 (%):  [30 %] 30 % Set Rate:  [14 bmp] 14 bmp Vt Set:  [540 mL] 540 mL PEEP:  [5 cmH20] 5 cmH20 Plateau Pressure:  [20 cmH20-28 cmH20] 28 cmH20  INTAKE / OUTPUT: I/O last 3 completed shifts: In: 4407.4 [I.V.:2602.4; NG/GT:1505; IV CHENIDPOE:423] Out: 1610 [Urine:580; Emesis/NG output:600; Stool:430]  PHYSICAL EXAMINATION:  General - on vent Eyes - pupils dilated ENT - ETT in place Cardiac - regular, no murmur Chest - b/l rhonchi Abd - distended, increased tympany Ext - 1+ edema Skin - several areas of pressure ulcers Neuro - comatose   LABS:  BMET  Recent Labs Lab 03/10/17 0504 03/11/17 0506 03/12/17 0520  NA 141 141 140  K 3.2* 3.3* 3.8  CL 102 101 100*  CO2 18* 20* 19*  BUN 151* 156* 170*  CREATININE 3.18* 3.04* 3.34*  GLUCOSE 157* 172* 89    Electrolytes  Recent Labs Lab 03/10/17 0504 03/11/17 0506 03/12/17 0520  CALCIUM 7.1* 7.0* 7.1*  MG 2.5* 2.4 2.4  PHOS 6.4* 6.4* 7.2*    CBC  Recent Labs Lab 03/10/17 0504 03/11/17 0506 03/12/17 0520  WBC 23.6* 16.0* 17.6*  HGB 8.0* 7.6* 7.1*  HCT 25.3* 24.6* 22.7*  PLT 296 317 307    Coag's  Recent Labs Lab 03/10/17 1251  APTT 37*  INR 1.19    Sepsis Markers  Recent Labs Lab 03/10/17 1251  03/11/17 0506 03/11/17 1056 03/11/17 1316 03/12/17 0520  LATICACIDVEN  --  5.8* 6.0* 6.8*  --   PROCALCITON 7.91 11.28  --   --  13.24    ABG  Recent Labs Lab 03/09/17 0805 03/09/17 1743 03/10/17 0336  PHART 7.363 7.454* 7.427  PCO2ART 26.4* 24.6* 28.4*  PO2ART 136* 129.0* 72.4*    Liver Enzymes  Recent Labs Lab 03/07/17 0522  03/09/17 0409 03/10/17 0504 03/10/17 1251 03/11/17 0506  AST 104*  --  71*  --  80*  --   ALT 53  --  46  --  44  --   ALKPHOS 604*  --  547*  --  419*  --   BILITOT 2.7*  --  2.3*  --  4.2*  --   ALBUMIN 1.3*  < > 1.2* 1.1* 1.1* <1.0*  < > = values in this interval not displayed.  Cardiac Enzymes  Recent Labs Lab 03/09/17 0700  TROPONINI 0.05*    Glucose  Recent Labs Lab 03/11/17 1952 03/11/17 2310 03/11/17 2351 03/12/17 0321 03/12/17 0521 03/12/17 0759  GLUCAP 78 45* 111* 68 75 75    Imaging Dg Abd Portable 1v  Result Date: 03/12/2017 CLINICAL DATA:  71 year old male status post NG tube placement. EXAM: PORTABLE ABDOMEN - 1  VIEW COMPARISON:  Abdominal CT dated 02/23/2017 FINDINGS: An enteric tube is partially visualized with tip in the right upper quadrant likely in the distal stomach. Multiple dilated loops of small bowel measure up to 3.8 cm. There is degenerative changes of the spine. No acute osseous pathology. IMPRESSION: Enteric tube with tip in the distal stomach. Dilated air-filled loops of small bowel measure up to 3.8 cm in diameter. Electronically Signed   By: Anner Crete M.D.   On: 03/12/2017 00:26   US Abdomen Limited Ruq  Result Date: 03/11/2017 CLINICAL DATA:  71 year old male with hepatitis-C, cirrhosis, and sepsis. EXAM: ULTRASOUND ABDOMEN LIMITED RIGHT UPPER QUADRANT COMPARISON:  Abdominal CT dated 02/17/2017 FINDINGS: Gallbladder: There is a 4 mm gallbladder polyp. Small amount of sludge noted within the gallbladder. No definite stone. There is no gallbladder wall thickening or pericholecystic fluid.  Negative sonographic Murphy's sign. Evaluation however is limited as the patient was on vent. Common bile duct: Diameter: 4 mm Liver: The liver demonstrates a heterogeneous and nodular echotexture in keeping with known cirrhosis. There are areas of scalloping of the liver capsule as well as small perihepatic ascites. There is a 2.5 x 2.8 x 2.4 cm hyperechoic lesion in the right lobe of the liver. A 4.0 x 4.9 x 4.6 cm heterogeneous lesion is also noted in the right lobe of the liver. Further characterization with MRI without and with contrast recommended. Portal vein is patent on color Doppler imaging with normal direction of blood flow towards the liver. IMPRESSION: 1. No gallstone or sonographic evidence of acute cholecystitis. Probable 4 mm gallbladder polyp. 2. Heterogeneous liver in keeping with known cirrhosis. Multiple hepatic masses, incompletely characterized. Further evaluation with MRI without and with contrast recommended. 3. Small ascites. 4. Patent main portal vein with hepatopetal flow. Electronically Signed   By: Anner Crete M.D.   On: 03/11/2017 22:42     STUDIES:  CT chest 10/19 >> atherosclerosis CT abd/pelvis 10/19 >> cirrhosis, gallstones, ascites CT head 10/19 >> no acute findings EEG 10/20 >> global cerebral dysfunction worse on Lt, PLEDS from Rt frontal and Rt posterior temporal lobes Echo 10/21 >> EF 65 to 70%, grade 1 DD MRI brain 10/21 >> cortical/basal ganglia edema Rt hemisphere LP 10/22 >> glucose 92, protein 115, RBC 0, WBC 0 MRA head/neck 10/23 >> patent carotid and vertebral arteries, patent circle of Willis MRI brain 10/24 >> resolving Rt cerebrum signal abnormality, persistent edema Rt hippocampus EEG 10/26 >> focal epileptic irritative zone Rt posterior quadrant due to PLEDS  CULTURES: Blood 10/19 >> Coag neg Staph Sputum 10/20 >> Acinetobacter Blood 10/20 >> negative CSF 10/22 >> negative CSF HSV 10/22 >> negative C diff 10/29 >>  negative  ANTIBIOTICS: Vancomycin 10/19 >> 10/19 Zosyn 10/19 >> 10/25  SIGNIFICANT EVENTS: 10/19 Admit, cardiology consulted 10/20 Neurology, nephrology consulted 10/21 Cardiology s/o 10/25 Burst suppression 10/27 Bradycardia >> diprivan d/c 10/28 Dopamine started 10/29 Ethics committee consulted  LINES/TUBES: ETT 10/19 >> Lt radial aline 10/19 >> 10/23 Rt IJ CVL 10/20 >>  ASSESSMENT / PLAN:  Acute hypoxic respiratory failure. - full vent support >> not able to tolerate weaning  Aspiration pneumonia with Acinetobacter. - completed Abx 10/25  Comatose. Status epilepticus. Hx of ETOH. Elevated ammonia. - continue AEDs  Shock from volume depletion and sepsis. - even fluid balance  Bradycardia. 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 - thiamine, folic acid, MVI  Acute renal failure from volume depletion and ACE inhibitor >> uncertain  what baseline renal fx is. Hypokalemia. Lactic acidosis. - not a candidate for renal replacement  Deep tissue injuries Rt hand, Lt thigh, Rt shoulder. - present prior to admission - continue wound care  Hypoglycemia. - dextrose in IV fluids  Anemia of critical illness and chronic disease. - defer further lab testing  DVT prophylaxis - SQ heparin SUP - Protonix Nutrition - tube feeds Goals of care - I do not see any scenario in which he can improve.  Additional therapies at this time in my opinion would be futile.  Multiple attempts have been unsuccessful to find family to assist with decision making.  I would recommend transitioning to comfort measures.  Chesley Mires, MD [facility] Pulmonary/Critical Care 03/12/2017, 8:53 AM Pager:  [phone] After 3pm call: [phone]

## 2017-03-12 NOTE — Progress Notes (Signed)
[facility] Progress Note    Assessment/ Plan:   1 AKI vs CKD: he is volume overloaded and not a candidate for RRT. He likely has hemodynamically mediated ATN and has significant azotemia in the setting of his multisystem organ failure.   2 Encephalopathy: not responsive, neuro following, was in status epilepticus 3 Acute hypoxic respiratory failure: on vent 4. Vomiting: ? Aspiration, TF stopped.  We will sign off.  Agree with measures towards comfort care.   Subjective:    Little overall change.  He has multisystem organ failure and no improvement in status despite maximal medical therapy.   Objective:   BP (!) 97/52   Pulse 62   Temp 99.1 F (37.3 C)   Resp (!) 24   Ht 6' (1.829 m)   Wt 101.6 kg (223 lb 15.8 oz)   SpO2 100%   BMI 30.38 kg/m   Intake/Output Summary (Last 24 hours) at 03/12/17 1332 Last data filed at 03/12/17 1154  Gross per 24 hour  Intake          1418.93 ml  Output             1225 ml  Net           193.93 ml   Weight change:   Physical Exam: General appearance: nonresponsive to stim Resp: bilateral rhonchi and crackles Cardio: S1, S2 normal, + II/VI systolic murmur GI: + distended, hypoactive bowel sounds Extremities +edema 3 + SKIN: + DTIs  Imaging: Ct Head Wo Contrast  Result Date: 03/10/2017 CLINICAL DATA:  Altered level of consciousness. Refractory status epilepticus. EXAM: CT HEAD WITHOUT CONTRAST TECHNIQUE: Contiguous axial images were obtained from the base of the skull through the vertex without intravenous contrast. COMPARISON:  MRI of the head March 06, 2017 and CT HEAD March 06, 2017 FINDINGS: BRAIN: Persistent blurring of the RIGHT frontotemporal lobes including RIGHT insula and hippocampus gray-white matter differentiation, subtle involvement of the RIGHT thalamus better demonstrated on prior MRI. No intraparenchymal hemorrhage, midline shift or acute large vascular territory infarcts. Ventricles and sulci are  overall normal for patient's age. No abnormal extra-axial fluid collections. VASCULAR: Mild calcific atherosclerosis of the carotid siphons. SKULL: No skull fracture. No significant scalp soft tissue swelling. SINUSES/ORBITS: Mild sphenoid and maxillary sinusitis. Small bilateral mastoid effusions, middle ear effusions. Soft tissue within the RIGHT greater than LEFT external auditory canals could reflect cerumen. The included ocular globes and orbital contents are non-suspicious. OTHER: None. IMPRESSION: 1. Persistently edematous RIGHT frontotemporal lobes including RIGHT insula and hippocampus, and RIGHT thalamus. Findings seen with status epilepticus, underlying potential infectious or inflammatory encephalitis. 2. Persistent middle ear mastoid effusions. Electronically Signed   By: Elon Alas M.D.   On: 03/10/2017 23:07   Dg Chest Port 1 View  Result Date: 03/11/2017 CLINICAL DATA:  Acute respiratory failure with hypoxia, altered mental status, intubated patient. EXAM: PORTABLE CHEST 1 VIEW COMPARISON:  Portable chest x-ray of March 10, 2017 FINDINGS: The lungs are mildly hypoinflated. There is no focal infiltrate. There is no pleural effusion or pneumothorax. The heart and pulmonary vascularity are normal. The endotracheal tube tip lies approximately 5.9 cm above the carina. The esophagogastric tube tip in proximal port project below the inferior margin of the image. The right internal jugular venous catheter tip projects over the distal third of the SVC. IMPRESSION: Mild hypoinflation. No acute cardiopulmonary abnormality. The support tubes are in reasonable position. Electronically Signed   By: David  Martinique M.D.   On:  03/11/2017 07:57   Dg Abd Portable 1v  Result Date: 03/12/2017 CLINICAL DATA:  71 year old male status post NG tube placement. EXAM: PORTABLE ABDOMEN - 1 VIEW COMPARISON:  Abdominal CT dated 02/27/2017 FINDINGS: An enteric tube is partially visualized with tip in the right  upper quadrant likely in the distal stomach. Multiple dilated loops of small bowel measure up to 3.8 cm. There is degenerative changes of the spine. No acute osseous pathology. IMPRESSION: Enteric tube with tip in the distal stomach. Dilated air-filled loops of small bowel measure up to 3.8 cm in diameter. Electronically Signed   By: Anner Crete M.D.   On: 03/12/2017 00:26   US Abdomen Limited Ruq  Result Date: 03/11/2017 CLINICAL DATA:  71 year old male with hepatitis-C, cirrhosis, and sepsis. EXAM: ULTRASOUND ABDOMEN LIMITED RIGHT UPPER QUADRANT COMPARISON:  Abdominal CT dated 02/28/2017 FINDINGS: Gallbladder: There is a 4 mm gallbladder polyp. Small amount of sludge noted within the gallbladder. No definite stone. There is no gallbladder wall thickening or pericholecystic fluid. Negative sonographic Murphy's sign. Evaluation however is limited as the patient was on vent. Common bile duct: Diameter: 4 mm Liver: The liver demonstrates a heterogeneous and nodular echotexture in keeping with known cirrhosis. There are areas of scalloping of the liver capsule as well as small perihepatic ascites. There is a 2.5 x 2.8 x 2.4 cm hyperechoic lesion in the right lobe of the liver. A 4.0 x 4.9 x 4.6 cm heterogeneous lesion is also noted in the right lobe of the liver. Further characterization with MRI without and with contrast recommended. Portal vein is patent on color Doppler imaging with normal direction of blood flow towards the liver. IMPRESSION: 1. No gallstone or sonographic evidence of acute cholecystitis. Probable 4 mm gallbladder polyp. 2. Heterogeneous liver in keeping with known cirrhosis. Multiple hepatic masses, incompletely characterized. Further evaluation with MRI without and with contrast recommended. 3. Small ascites. 4. Patent main portal vein with hepatopetal flow. Electronically Signed   By: Anner Crete M.D.   On: 03/11/2017 22:42    Labs: BMET  Recent Labs Lab 03/06/17 0400  03/07/17 0522 03/08/17 0420 03/09/17 0409 03/10/17 0504 03/11/17 0506 03/12/17 0520  NA 144 145 146* 142 141 141 140  K 3.5 3.3* 3.5 4.0 3.2* 3.3* 3.8  CL 104 106 106 104 102 101 100*  CO2 22 22 21* 18* 18* 20* 19*  GLUCOSE 128* 129* 145* 145* 157* 172* 89  BUN 137* 134* 137* 141* 151* 156* 170*  CREATININE 3.14* 3.01* 3.10* 3.20* 3.18* 3.04* 3.34*  CALCIUM 7.1* 6.8* 7.3* 7.3* 7.1* 7.0* 7.1*  PHOS 5.9* 5.9* 6.0* 6.5* 6.4* 6.4* 7.2*   CBC  Recent Labs Lab 03/09/17 0409 03/10/17 0504 03/11/17 0506 03/12/17 0520  WBC 19.5* 23.6* 16.0* 17.6*  NEUTROABS  --  21.4* 14.4* 15.1*  HGB 7.9* 8.0* 7.6* 7.1*  HCT 26.6* 25.3* 24.6* 22.7*  MCV 81.1 80.3 79.6 79.6  PLT 284 296 317 307    Medications:    . chlorhexidine gluconate (MEDLINE KIT)  15 mL Mouth Rinse BID  . Chlorhexidine Gluconate Cloth  6 each Topical Daily  . mouth rinse  15 mL Mouth Rinse 10 times per day  . pantoprazole (PROTONIX) IV  40 mg Intravenous Q24H  . silver sulfADIAZINE   Topical BID  . sodium chloride flush  10-40 mL Intracatheter Q12H      Madelon Lips MD [facility] pgr [phone] 03/12/2017, 1:32 PM

## 2017-03-13 DIAGNOSIS — Z7189 Other specified counseling: Secondary | ICD-10-CM

## 2017-03-13 DIAGNOSIS — Z978 Presence of other specified devices: Secondary | ICD-10-CM

## 2017-03-13 DIAGNOSIS — A419 Sepsis, unspecified organism: Secondary | ICD-10-CM

## 2017-03-13 DIAGNOSIS — Z515 Encounter for palliative care: Secondary | ICD-10-CM

## 2017-03-13 LAB — GLUCOSE, CAPILLARY
GLUCOSE-CAPILLARY: 120 mg/dL — AB (ref 65–99)
GLUCOSE-CAPILLARY: 128 mg/dL — AB (ref 65–99)
Glucose-Capillary: 117 mg/dL — ABNORMAL HIGH (ref 65–99)
Glucose-Capillary: 104 mg/dL — ABNORMAL HIGH (ref 65–99)

## 2017-03-13 MED ORDER — FENTANYL CITRATE (PF) 100 MCG/2ML IJ SOLN
25.0000 ug | INTRAMUSCULAR | Status: DC | PRN
  Filled 2017-03-13: qty 2

## 2017-03-13 MED ORDER — LORAZEPAM 2 MG/ML IJ SOLN: 1.0000 mg | INTRAMUSCULAR | Status: DC | PRN

## 2017-03-13 MED ORDER — GLYCOPYRROLATE 0.2 MG/ML IJ SOLN: 0.4000 mg | INTRAMUSCULAR | Status: DC | PRN

## 2017-03-14 NOTE — Consult Note (Signed)
Consultation Note Date: 04-12-17   Patient Name: Dennis [name]  DOB: [birth year]  MRN: [id number]  Age / Sex: 71 y.o., male  PCP: System, Pcp Not In Referring Physician: Raylene Miyamoto, MD  Reason for Consultation: Terminal Care  HPI/Patient Profile: 71 y.o. male  with past medical history of DM HTN HLD admitted on 03/05/2017 after being found unresponsive at home, hypothermic, VDRF, bradycardia, AKI, seizure, underlying history of ETOH use, HCV, pancreatitis, stage II CKD.   Hospital course is noted to have been complicated by ongoing hemodynamic instability, acute hypoxic resp failure, VDRF, use of pressors, use of anti epileptic drugs, ongoing bradycardia, has remained comatose with concern for status epilepticus, has asp pna with acinetobacter. Also with AKI, renal has been following, they have noted, and I concur, that the patient is not a candidate for renal replacement therapies.   An ethics assessment was completed earlier today, a palliative consult request was then made.   Clinical Assessment and Goals of Care:  Dennis [name] is on the vent, he has labored breathing, his eyes are open with fixed pupils, he is with gurgling sounds, not responsive, he is on Dopamine and anti epileptics. I discussed with PCCM MD and bedside RN and bedside RT about scope of palliative services: Palliative medicine is specialized medical care for people living with serious illness. It focuses on providing relief from the symptoms and stress of a serious illness. The goal is to improve quality of life for both the patient and the family.  There is no family at the bedside, appreciate exhaustive attempts in the past several days in trying to find out about the patient's family. There is no next of kin that has been identified, after due diligence, see below.   I agree with comfort measures only, with compassionate  extubation, to have Dennis [name] pass away with comfort and dignity, in my opinion, continuation of current therapies would be futile.   Thank you for the consult, see below:   OTHER  no family has been able to be traced, it is noted in chart review, from internal medicine resident's note from a few months ago, that the patient's wife of 30 years passed away in 20-May-2016.  I have been notified that due diligence has been done, in attempts to find family as per police search as well as hospital case management/social work.    SUMMARY OF RECOMMENDATIONS    chart reviewed in detail, discussed with PCCM MD Dr Pati Gallo and bedside RN:  Agree with ethics consult, agree with recommendations to proceed with comfort care.   Patient is actively dying, in multi organ failure, with no chance of meaningful recovery, in my opinion, full scope of comfort measures is most humane and appropriate mode of care, at this point.   DNR, may extubate to room air.   Will have some PRN comfort medications, available through the palliative care end of life order set for pain, short ness of breath, anxiety, agitation or terminal secretions. Appreciate chaplain consult.  Code Status/Advance Care Planning:  DNR    Symptom Management:    As above  Palliative Prophylaxis:   Bowel Regimen  Additional Recommendations (Limitations, Scope, Preferences):  Full Comfort Care  Psycho-social/Spiritual:   Desire for further Chaplaincy support:yes  Additional Recommendations: Compassionate Wean Education  Prognosis:   Hours - Days  Discharge Planning: Anticipated Hospital Death      Primary Diagnoses: Present on Admission: . Acute renal failure (ARF) ([place]) . Unresponsive . Bradycardia, sinus   I have reviewed the medical record, interviewed the patient and family, and examined the patient. The following aspects are pertinent.  Past Medical History:  Diagnosis Date  . Bradycardia, sinus 02/21/2017     Social History   Social History  . Marital status: Married    Spouse name: N/A  . Number of children: N/A  . Years of education: N/A   Social History Main Topics  . Smoking status: None  . Smokeless tobacco: None  . Alcohol use None  . Drug use: Unknown  . Sexual activity: Not Asked   Other Topics Concern  . None   Social History Narrative  . None   No family history on file. Scheduled Meds: . chlorhexidine gluconate (MEDLINE KIT)  15 mL Mouth Rinse BID  . Chlorhexidine Gluconate Cloth  6 each Topical Daily  . mouth rinse  15 mL Mouth Rinse 10 times per day  . pantoprazole (PROTONIX) IV  40 mg Intravenous Q24H  . silver sulfADIAZINE   Topical BID  . sodium chloride flush  10-40 mL Intracatheter Q12H   Continuous Infusions: . dextrose 50 mL/hr at 03/22/2017 0700  . DOPamine 19.978 mcg/kg/min (Mar 22, 2017 0700)  . lacosamide (VIMPAT) IV Stopped (March 22, 2017 0936)  . levETIRAcetam Stopped (22-Mar-2017 1245)   PRN Meds:.albuterol, artificial tears Medications Prior to Admission:  Prior to Admission medications   Not on File   No Known Allergies Review of Systems Non verbal  Physical Exam Actively dying on the vent Is maxed out on dopamine SBP in the 60s currently Pupils dilated ETT in place S1 S2 on monitor Edema generalized Not awake not alert, VDRF, not on sedation On anti epileptics currently  Vital Signs: BP (!) 70/46   Pulse (!) 51   Temp (!) 97 F (36.1 C)   Resp (!) 26   Ht 6' (1.829 m)   Wt 101.6 kg (223 lb 15.8 oz)   SpO2 98%   BMI 30.38 kg/m  Pain Assessment: CPOT   Pain Score: 0-No pain   SpO2: SpO2:  (wont pick up) O2 Device:SpO2:  (wont pick up) O2 Flow Rate: .   IO: Intake/output summary:  Intake/Output Summary (Last 24 hours) at 03/22/2017 1514 Last data filed at 2017-03-22 1047  Gross per 24 hour  Intake           1426.8 ml  Output              155 ml  Net           1271.8 ml   PPS 10% LBM: Last BM Date: 03/11/17 Baseline Weight:  Weight: 65 kg (143 lb 4.8 oz) Most recent weight: Weight: 101.6 kg (223 lb 15.8 oz)     Palliative Assessment/Data:     Time In:  1415 Time Out:  1515 Time Total:  60 min  Greater than 50%  of this time was spent counseling and coordinating care related to the above assessment and plan.  Signed by: Loistine Chance, MD  [phone]  Please contact Palliative Medicine Team phone at [phone] for questions and concerns.  For individual provider: See Shea Evans

## 2017-03-14 NOTE — Plan of Care (Signed)
Problem: Pain Managment: Goal: General experience of comfort will improve Outcome: Progressing Collaborating with medicine on comfort measures for this patient.

## 2017-03-14 NOTE — Death Summary Note (Signed)
ADMISSION DATE:  02/11/2017 Date of Death 03/30/2017   REFERRING MD:  Dr. Kieth Brightly, Trauma     BRIEF SUMMARY: 71 yo male found unresponsive at home, hypothermic, VDRF, bradycardia, acute renal failure, seizure.  PMHx of ETOH, HCV, Pancreatitis, DM, HTN, CKD 2.   Patient had acomplicated course in ICU with progressive MSOF. See chart for details.  VITAL SIGNS: BP (!) 79/53   Pulse 70   Temp (!) 97.3 F (36.3 C)   Resp (!) 22   Ht 6' (1.829 m)   Wt 223 lb 15.8 oz (101.6 kg)   SpO2 99%   BMI 30.38 kg/m     VENTILATOR SETTINGS: Vent Mode: PRVC FiO2 (%):  [30 %] 30 % Set Rate:  [14 bmp] 14 bmp Vt Set:  [540 mL] 540 mL PEEP:  [5 cmH20] 5 cmH20 Plateau Pressure:  [19 cmH20-23 cmH20] 23 cmH20  INTAKE / OUTPUT: I/O last 3 completed shifts: In: 2841.1 [I.V.:2631.1; IV Piggyback:210] Out: 975 [Urine:275; Emesis/NG output:700]  PHYSICAL EXAMINATION: General:  Adult male on vent, appears to be actively dying HEENT: MM pink/moist, ETT Neuro: obtunded CV: s1s2 rrr, no m/r/g PULM: even/non-labored, lungs bilaterally coarse  FX:TKWI, non-tender, bsx4 active  Extremities: warm/dry, trace generalized edema  Skin: no rashes or lesions    BMET  Recent Labs Lab 03/10/17 0504 03/11/17 0506 03/12/17 0520  NA 141 141 140  K 3.2* 3.3* 3.8  CL 102 101 100*  CO2 18* 20* 19*  BUN 151* 156* 170*  CREATININE 3.18* 3.04* 3.34*  GLUCOSE 157* 172* 89    Electrolytes  Recent Labs Lab 03/10/17 0504 03/11/17 0506 03/12/17 0520  CALCIUM 7.1* 7.0* 7.1*  MG 2.5* 2.4 2.4  PHOS 6.4* 6.4* 7.2*    CBC  Recent Labs Lab 03/10/17 0504 03/11/17 0506 03/12/17 0520  WBC 23.6* 16.0* 17.6*  HGB 8.0* 7.6* 7.1*  HCT 25.3* 24.6* 22.7*  PLT 296 317 307    Coag's  Recent Labs Lab 03/10/17 1251  APTT 37*  INR 1.19    Sepsis Markers  Recent Labs Lab 03/10/17 1251 03/11/17 0506 03/11/17 1056 03/11/17 1316 03/12/17 0520  LATICACIDVEN  --  5.8*  6.0* 6.8*  --   PROCALCITON 7.91 11.28  --   --  13.24    ABG  Recent Labs Lab 03/09/17 0805 03/09/17 1743 03/10/17 0336  PHART 7.363 7.454* 7.427  PCO2ART 26.4* 24.6* 28.4*  PO2ART 136* 129.0* 72.4*    Liver Enzymes  Recent Labs Lab 03/07/17 0522  03/09/17 0409 03/10/17 0504 03/10/17 1251 03/11/17 0506  AST 104*  --  71*  --  80*  --   ALT 53  --  46  --  44  --   ALKPHOS 604*  --  547*  --  419*  --   BILITOT 2.7*  --  2.3*  --  4.2*  --   ALBUMIN 1.3*  < > 1.2* 1.1* 1.1* <1.0*  < > = values in this interval not displayed.  Cardiac Enzymes  Recent Labs Lab 03/09/17 0700  TROPONINI 0.05*    Glucose  Recent Labs Lab 03/12/17 1308 03/12/17 1540 03/12/17 1941 03/12/17 2324 03/30/2017 0316 Mar 30, 2017 0758  GLUCAP 104* 96 104* 113* 120* 128*    Imaging No results found.   STUDIES:  CT chest 10/19 >> atherosclerosis CT abd/pelvis 10/19 >> cirrhosis, gallstones, ascites CT head 10/19 >> no acute findings EEG 10/20 >> global cerebral dysfunction worse on Lt, PLEDS from Rt frontal and Rt posterior temporal  lobes Echo 10/21 >> EF 65 to 70%, grade 1 DD MRI brain 10/21 >> cortical/basal ganglia edema Rt hemisphere LP 10/22 >> glucose 92, protein 115, RBC 0, WBC 0 MRA head/neck 10/23 >> patent carotid and vertebral arteries, patent circle of Willis MRI brain 10/24 >> resolving Rt cerebrum signal abnormality, persistent edema Rt hippocampus EEG 10/26 >> focal epileptic irritative zone Rt posterior quadrant due to PLEDS  CULTURES: Blood 10/19 >> Coag neg Staph Sputum 10/20 >> Acinetobacter Blood 10/20 >> negative CSF 10/22 >> negative CSF HSV 10/22 >> negative C diff 10/29 >> negative  ANTIBIOTICS: Vancomycin 10/19 >> 10/19 Zosyn 10/19 >> 10/25  SIGNIFICANT EVENTS: 10/19 Admit, cardiology consulted 10/20 Neurology, nephrology consulted 10/21 Cardiology s/o 10/25 Burst suppression 10/27 Bradycardia >> diprivan d/c 10/28 Dopamine  started 10/29 Ethics committee consulted  LINES/TUBES: ETT 10/19 >> Lt radial aline 10/19 >> 10/23 Rt IJ CVL 10/20 >>  ASSESSMENT / PLAN:  Acute hypoxic respiratory failure.  Aspiration pneumonia with Acinetobacter.   Comatose. Status epilepticus. Hx of ETOH. Elevated ammonia  Shock from volume depletion and sepsis.  Bradycardia.  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 Acute renal failure from volume depletion and ACE inhibitor  Hypokalemia. Lactic acidosis. Not a candidate for HD  Deep tissue injuries Rt hand, Lt thigh, Rt shoulder - present prior to admit Hypoglycemia. Anemia of critical illness and chronic disease.   Patient was actively dying; Poor chance of meaningful recovery. No family could be reached. Additional therapies  Was deemed to be  futile.  Multiple attempts have been unsuccessful in the past  to find family to assist with decision making.   Spiritual care visited patient.         Palliative Care and Ethics consult noted; It was recommended that patient could be  extubated to comfort care measures.  However, patient had expired at 16:32 hours on 04-08-17

## 2017-03-14 NOTE — Progress Notes (Signed)
At 16:32, patient died. He had no pulse, no heart sounds and no electrical activity. He was on ventilator. Declared dead with Buck Mam RN. Dr Pati Gallo notified. Medical examiner notified. No family was ever found for this patient. Tubing removed, pt sent to the morgue.

## 2017-03-14 NOTE — Progress Notes (Signed)
Dr Pati Gallo notified by Velna Hatchet RT that pilot balloon cannot be reinflated.

## 2017-03-14 NOTE — Progress Notes (Addendum)
PULMONARY / CRITICAL CARE MEDICINE   Name: Dennis [name] MRN: [id number] DOB: 1945-10-12    ADMISSION DATE:  02/19/2017 CONSULTATION DATE:  03/02/2017  REFERRING MD:  Dr. Kieth Brightly, Trauma  CHIEF COMPLAINT:  Respiratory failure  BRIEF SUMMARY: 71 yo male found unresponsive at home, hypothermic, VDRF, bradycardia, acute renal failure, seizure. PMHx of ETOH, HCV, Pancreatitis, DM, HTN, CKD 2.  SUBJECTIVE:  RN reports pt's BP declining despite vasopressor support.  Appears to be transitioning toward imminent death.   VITAL SIGNS: BP (!) 79/53   Pulse 70   Temp (!) 97.3 F (36.3 C)   Resp (!) 22   Ht 6' (1.829 m)   Wt 223 lb 15.8 oz (101.6 kg)   SpO2 99%   BMI 30.38 kg/m   HEMODYNAMICS:    VENTILATOR SETTINGS: Vent Mode: PRVC FiO2 (%):  [30 %] 30 % Set Rate:  [14 bmp] 14 bmp Vt Set:  [540 mL] 540 mL PEEP:  [5 cmH20] 5 cmH20 Plateau Pressure:  [19 cmH20-23 cmH20] 23 cmH20  INTAKE / OUTPUT: I/O last 3 completed shifts: In: 2841.1 [I.V.:2631.1; IV Piggyback:210] Out: 975 [Urine:275; Emesis/NG output:700]  PHYSICAL EXAMINATION: General:  Adult male on vent, appears to be actively dying HEENT: MM pink/moist, ETT Neuro: obtunded CV: s1s2 rrr, no m/r/g PULM: even/non-labored, lungs bilaterally coarse  PJ:ASNK, non-tender, bsx4 active  Extremities: warm/dry, trace generalized edema  Skin: no rashes or lesions  LABS:  BMET  Recent Labs Lab 03/10/17 0504 03/11/17 0506 03/12/17 0520  NA 141 141 140  K 3.2* 3.3* 3.8  CL 102 101 100*  CO2 18* 20* 19*  BUN 151* 156* 170*  CREATININE 3.18* 3.04* 3.34*  GLUCOSE 157* 172* 89    Electrolytes  Recent Labs Lab 03/10/17 0504 03/11/17 0506 03/12/17 0520  CALCIUM 7.1* 7.0* 7.1*  MG 2.5* 2.4 2.4  PHOS 6.4* 6.4* 7.2*    CBC  Recent Labs Lab 03/10/17 0504 03/11/17 0506 03/12/17 0520  WBC 23.6* 16.0* 17.6*  HGB 8.0* 7.6* 7.1*  HCT 25.3* 24.6* 22.7*  PLT 296 317 307    Coag's  Recent Labs Lab  03/10/17 1251  APTT 37*  INR 1.19    Sepsis Markers  Recent Labs Lab 03/10/17 1251 03/11/17 0506 03/11/17 1056 03/11/17 1316 03/12/17 0520  LATICACIDVEN  --  5.8* 6.0* 6.8*  --   PROCALCITON 7.91 11.28  --   --  13.24    ABG  Recent Labs Lab 03/09/17 0805 03/09/17 1743 03/10/17 0336  PHART 7.363 7.454* 7.427  PCO2ART 26.4* 24.6* 28.4*  PO2ART 136* 129.0* 72.4*    Liver Enzymes  Recent Labs Lab 03/07/17 0522  03/09/17 0409 03/10/17 0504 03/10/17 1251 03/11/17 0506  AST 104*  --  71*  --  80*  --   ALT 53  --  46  --  44  --   ALKPHOS 604*  --  547*  --  419*  --   BILITOT 2.7*  --  2.3*  --  4.2*  --   ALBUMIN 1.3*  < > 1.2* 1.1* 1.1* <1.0*  < > = values in this interval not displayed.  Cardiac Enzymes  Recent Labs Lab 03/09/17 0700  TROPONINI 0.05*    Glucose  Recent Labs Lab 03/12/17 1308 03/12/17 1540 03/12/17 1941 03/12/17 2324 04-01-17 0316 2017-04-01 0758  GLUCAP 104* 96 104* 113* 120* 128*    Imaging No results found.   STUDIES:  CT chest 10/19 >> atherosclerosis CT abd/pelvis 10/19 >> cirrhosis,  gallstones, ascites CT head 10/19 >> no acute findings EEG 10/20 >> global cerebral dysfunction worse on Lt, PLEDS from Rt frontal and Rt posterior temporal lobes Echo 10/21 >> EF 65 to 70%, grade 1 DD MRI brain 10/21 >> cortical/basal ganglia edema Rt hemisphere LP 10/22 >> glucose 92, protein 115, RBC 0, WBC 0 MRA head/neck 10/23 >> patent carotid and vertebral arteries, patent circle of Willis MRI brain 10/24 >> resolving Rt cerebrum signal abnormality, persistent edema Rt hippocampus EEG 10/26 >> focal epileptic irritative zone Rt posterior quadrant due to PLEDS  CULTURES: Blood 10/19 >> Coag neg Staph Sputum 10/20 >> Acinetobacter Blood 10/20 >> negative CSF 10/22 >> negative CSF HSV 10/22 >> negative C diff 10/29 >> negative  ANTIBIOTICS: Vancomycin 10/19 >> 10/19 Zosyn 10/19 >> 10/25  SIGNIFICANT EVENTS: 10/19 Admit,  cardiology consulted 10/20 Neurology, nephrology consulted 10/21 Cardiology s/o 10/25 Burst suppression 10/27 Bradycardia >> diprivan d/c 10/28 Dopamine started 10/29 Ethics committee consulted  LINES/TUBES: ETT 10/19 >> Lt radial aline 10/19 >> 10/23 Rt IJ CVL 10/20 >>  ASSESSMENT / PLAN:  Acute hypoxic respiratory failure. P: Continue full vent support > pt likely to pass on vent today    Aspiration pneumonia with Acinetobacter. P: Monitor, completed abx 10/25    Comatose. Status epilepticus. Hx of ETOH. Elevated ammonia. P: Continue AED's  Supportive care    Shock from volume depletion and sepsis. P: Even fluid balance    Bradycardia. 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 P: Thiamine, folate, MVI   Acute renal failure from volume depletion and ACE inhibitor >> uncertain what baseline renal fx is. Hypokalemia. Lactic acidosis. P: Not a candidate for HD    Deep tissue injuries Rt hand, Lt thigh, Rt shoulder - present prior to admit P: Wound care    Hypoglycemia. P: Dextrose in IVF's   Anemia of critical illness and chronic disease. P: Defer further lab testing for patient comfort    Goals of care - Multiple attempts at reaching family / next of kin (social work, GPD etc).  Unfortunately, he has suffered illness which he can not recover.  Adding additional therapies would only add to his suffering.  Will continue current supportive care without further escalation.  DNR / DNI.  Will ask chaplain to come by and visit with the patient.    Noe Gens, NP-C [facility] Pgr: [phone] or if no answer [phone] March 30, 2017, 8:48 AM     ---------------------------  I have seen and examined the patient.  Agree with APP's Assessment and plan. Please see my comments as follows.   71 y/o male with multi organ failure with Resp failure, AKI, cirrhosis,  HCV, chronic pancreatitis, encephalopathy due to metabolic derangement, seizures, Now hypotensive and Bradycardic, on max dose Dopamine. On vent- PRVC- 30% Fi02, Rae- 14, Vt 540 ml, Peep-5, Pupils dilated and fixed, Breath sounds equal, no murmur, abd-soft, Comatose,  Recent CXR- ETT- good position, Labs noted as above;   Pulmonary- Acute hypoxic respiratory failure. Continue present support. Aspiration pneumonia with Acinetobacter. Off Abx 10/25 ETT came out 3 cm per RT- to be repositioned back to where it was.  Neurology- Comatose, d/p Status epilepticus.h/o  ETOH abuse.- continue AEDs-vimpat & keppra.  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  Renal  Acute renal failure due to sepsis,volume depletion and ACE inhibitor >> uncertain  baseline renal    - not a candidate for renal replacement therapy.  Skin Deep tissue injuries Rt hand, Lt thigh, Rt shoulder. - present prior to admission - continue wound care  Endocrine= Hypoglycemia.        on  dextrose in IV fluids  Heme- Anemia of critical illness and chronic disease. - defer further lab testing  SUP & DVT prophylaxis - not a candidate due to terminal illness   Goals of care -  Patient is actively dying; Poor chance of meaningful recovery. No family could be reached.  Additional therapies at this time in my opinion would be futile.   Multiple attempts have been unsuccessful in the past  to find family to assist with decision making.   I would recommend transition  to comfort measures.  Spiritual care visited patient.  Thank you for letting me participate in the care of your patient.  I Have personally spent 45 Minutes in addition to APP's time In the care of this Patient providing Critical care Services; Time includes review of chart, labs, imaging, coordinating care with other  physicians and healthcare team members. Excludes time spent for Procedure and Teaching.  Note subject to typographical and grammatical errors; Any formal questions or concerns about the content, text, or information contained within the body of this dictation should be directly addressed to the physician for clarification.

## 2017-03-14 NOTE — Progress Notes (Signed)
   29-Mar-2017 1100  Clinical Encounter Type  Visited With Patient  Visit Type Initial;Death  Referral From Nurse  Recommendations prayer  Spiritual Encounters  Spiritual Needs Prayer  Stress Factors  Patient Stress Factors Major life changes  Nurse asked Chaplain to visit with PT for EOL.  The PT is not awake and comfortable.  Chaplain provided prayer and ministry of presence prior to transition.

## 2017-03-14 NOTE — Ethics Note (Signed)
Ethics Consult follow up -   We Lenor Coffin NP -Ethics Consult Service )  were consulted about this patient on Monday and I have noted that attempts to identify family or and appropriate ( legal ) surrogate have not been successful; and that recommendations from primary and other consultants do not believe that there are treatments to be offered which would benefit patient other than comfort care.   This case seems to meet the criteria outlined in the [place] and would recommend that the steps of this policy be followed. Some of these steps have already occurred. The Medical Futility Policy is to be found in the Patient Care Policies under - Medical Futility Policy. Some of the steps include Involving Palliative Care as well as having concurrence of physicians treating the patient that there are not beneficial treatments which could improve his medical condition and return him to a higher and desired quality of life and optimally so that the patient could make decisions for himself.   The other policy which is relevant is the Right to A Natural Death Policy which designates that in the absence of patient competency and any available legal surrogates, the primary physician in consultation with another physician of appropriate expertise may determine that withdrawal of life sustaining care should take place. Comfort Care is always a priority. Please feel free to consult with any additional questions or concers.   Wells Guiles  Ethics Consultant   # [phone]

## 2017-03-14 NOTE — Progress Notes (Signed)
Previous note from ethics committee shared with Dr Pati Gallo. Palliative consult ordered.

## 2017-03-14 DEATH — deceased

## 2017-03-15 ENCOUNTER — Encounter: Payer: Self-pay | Admitting: Internal Medicine

## 2017-04-25 ENCOUNTER — Telehealth: Payer: Self-pay

## 2017-04-25 NOTE — Telephone Encounter (Signed)
On 04/25/17 I received a d/c from [employer] (original). The d/c is for cremation. The patient is a patient of Doctor Sivakumar. The d/c will be taken to Pulmonary Unit @ [facility] for signature.  On 04/29/17 I received the d/c back from Doctor Halford Chessman who signed the d/c for Sivakumar. I got the d/c ready and called the funeral home to let them know the d/c is ready for pickup. I also faxed a copy to the funeral home per the funeral home request.
